# Patient Record
Sex: Female | Born: 1955 | Race: White | Hispanic: No | Smoking: Former smoker
Health system: Southern US, Community
[De-identification: ages and names within clinical notes are randomized; demographics above are authoritative.]

## PROBLEM LIST (undated history)

## (undated) DIAGNOSIS — M26609 Unspecified temporomandibular joint disorder, unspecified side: Secondary | ICD-10-CM

## (undated) DIAGNOSIS — Z86010 Personal history of colonic polyps: Secondary | ICD-10-CM

## (undated) DIAGNOSIS — M542 Cervicalgia: Secondary | ICD-10-CM

## (undated) DIAGNOSIS — R519 Headache, unspecified: Secondary | ICD-10-CM

## (undated) DIAGNOSIS — Z8719 Personal history of other diseases of the digestive system: Secondary | ICD-10-CM

## (undated) DIAGNOSIS — E119 Type 2 diabetes mellitus without complications: Secondary | ICD-10-CM

## (undated) DIAGNOSIS — C080 Malignant neoplasm of submandibular gland: Secondary | ICD-10-CM

## (undated) DIAGNOSIS — M1990 Unspecified osteoarthritis, unspecified site: Secondary | ICD-10-CM

## (undated) DIAGNOSIS — N3946 Mixed incontinence: Secondary | ICD-10-CM

## (undated) DIAGNOSIS — Z860101 Personal history of adenomatous and serrated colon polyps: Secondary | ICD-10-CM

## (undated) DIAGNOSIS — R682 Dry mouth, unspecified: Secondary | ICD-10-CM

## (undated) DIAGNOSIS — K649 Unspecified hemorrhoids: Secondary | ICD-10-CM

## (undated) DIAGNOSIS — Z923 Personal history of irradiation: Secondary | ICD-10-CM

## (undated) DIAGNOSIS — E781 Pure hyperglyceridemia: Secondary | ICD-10-CM

## (undated) DIAGNOSIS — Z853 Personal history of malignant neoplasm of breast: Secondary | ICD-10-CM

## (undated) DIAGNOSIS — F319 Bipolar disorder, unspecified: Secondary | ICD-10-CM

## (undated) DIAGNOSIS — Z8669 Personal history of other diseases of the nervous system and sense organs: Secondary | ICD-10-CM

## (undated) DIAGNOSIS — C50919 Malignant neoplasm of unspecified site of unspecified female breast: Secondary | ICD-10-CM

## (undated) DIAGNOSIS — R432 Parageusia: Secondary | ICD-10-CM

## (undated) DIAGNOSIS — F411 Generalized anxiety disorder: Secondary | ICD-10-CM

## (undated) DIAGNOSIS — K219 Gastro-esophageal reflux disease without esophagitis: Secondary | ICD-10-CM

## (undated) DIAGNOSIS — Z973 Presence of spectacles and contact lenses: Secondary | ICD-10-CM

## (undated) DIAGNOSIS — Z8659 Personal history of other mental and behavioral disorders: Secondary | ICD-10-CM

## (undated) DIAGNOSIS — J449 Chronic obstructive pulmonary disease, unspecified: Secondary | ICD-10-CM

## (undated) DIAGNOSIS — IMO0002 Reserved for concepts with insufficient information to code with codable children: Secondary | ICD-10-CM

## (undated) DIAGNOSIS — Z9889 Other specified postprocedural states: Secondary | ICD-10-CM

## (undated) DIAGNOSIS — D649 Anemia, unspecified: Secondary | ICD-10-CM

## (undated) DIAGNOSIS — K08199 Complete loss of teeth due to other specified cause, unspecified class: Secondary | ICD-10-CM

## (undated) DIAGNOSIS — R51 Headache: Secondary | ICD-10-CM

## (undated) DIAGNOSIS — G629 Polyneuropathy, unspecified: Secondary | ICD-10-CM

## (undated) DIAGNOSIS — Z9221 Personal history of antineoplastic chemotherapy: Secondary | ICD-10-CM

## (undated) HISTORY — DX: Pure hyperglyceridemia: E78.1

## (undated) HISTORY — DX: Bipolar disorder, unspecified: F31.9

## (undated) HISTORY — PX: EXCISION / BIOPSY BREAST / NIPPLE / DUCT: SUR469

## (undated) HISTORY — PX: TUBAL LIGATION: SHX77

## (undated) HISTORY — DX: Gastro-esophageal reflux disease without esophagitis: K21.9

## (undated) HISTORY — PX: ECTOPIC PREGNANCY SURGERY: SHX613

## (undated) HISTORY — DX: Malignant neoplasm of unspecified site of unspecified female breast: C50.919

## (undated) HISTORY — PX: OTHER SURGICAL HISTORY: SHX169

## (undated) HISTORY — DX: Chronic obstructive pulmonary disease, unspecified: J44.9

## (undated) HISTORY — PX: VAGINAL HYSTERECTOMY: SUR661

---

## 2002-10-11 ENCOUNTER — Ambulatory Visit (HOSPITAL_COMMUNITY): Admission: RE | Admit: 2002-10-11 | Discharge: 2002-10-11 | Payer: Self-pay | Admitting: Pulmonary Disease

## 2006-11-17 ENCOUNTER — Ambulatory Visit (HOSPITAL_COMMUNITY): Admission: RE | Admit: 2006-11-17 | Discharge: 2006-11-17 | Payer: Self-pay | Admitting: Family Medicine

## 2006-11-24 ENCOUNTER — Ambulatory Visit (HOSPITAL_COMMUNITY): Admission: RE | Admit: 2006-11-24 | Discharge: 2006-11-24 | Payer: Self-pay | Admitting: Family Medicine

## 2006-12-01 ENCOUNTER — Encounter (INDEPENDENT_AMBULATORY_CARE_PROVIDER_SITE_OTHER): Payer: Self-pay | Admitting: *Deleted

## 2006-12-01 ENCOUNTER — Ambulatory Visit (HOSPITAL_COMMUNITY): Admission: RE | Admit: 2006-12-01 | Discharge: 2006-12-01 | Payer: Self-pay | Admitting: General Surgery

## 2006-12-01 HISTORY — PX: PARTIAL MASTECTOMY WITH AXILLARY SENTINEL LYMPH NODE BIOPSY: SHX6004

## 2006-12-21 DIAGNOSIS — C50919 Malignant neoplasm of unspecified site of unspecified female breast: Secondary | ICD-10-CM

## 2006-12-21 HISTORY — DX: Malignant neoplasm of unspecified site of unspecified female breast: C50.919

## 2006-12-22 ENCOUNTER — Ambulatory Visit (HOSPITAL_COMMUNITY): Payer: Self-pay | Admitting: Oncology

## 2006-12-22 ENCOUNTER — Encounter (HOSPITAL_COMMUNITY): Admission: RE | Admit: 2006-12-22 | Discharge: 2007-01-21 | Payer: Self-pay | Admitting: Oncology

## 2006-12-23 ENCOUNTER — Ambulatory Visit: Payer: Self-pay | Admitting: Cardiovascular Disease

## 2006-12-29 HISTORY — PX: TRANSTHORACIC ECHOCARDIOGRAM: SHX275

## 2006-12-31 ENCOUNTER — Ambulatory Visit (HOSPITAL_COMMUNITY): Admission: RE | Admit: 2006-12-31 | Discharge: 2006-12-31 | Payer: Self-pay | Admitting: General Surgery

## 2007-01-03 ENCOUNTER — Encounter: Admission: RE | Admit: 2007-01-03 | Discharge: 2007-01-03 | Payer: Self-pay | Admitting: Dentistry

## 2007-01-03 ENCOUNTER — Ambulatory Visit: Payer: Self-pay | Admitting: Dentistry

## 2007-01-28 ENCOUNTER — Encounter (HOSPITAL_COMMUNITY): Admission: RE | Admit: 2007-01-28 | Discharge: 2007-02-27 | Payer: Self-pay | Admitting: Oncology

## 2007-02-08 ENCOUNTER — Ambulatory Visit (HOSPITAL_COMMUNITY): Payer: Self-pay | Admitting: Oncology

## 2007-02-11 ENCOUNTER — Inpatient Hospital Stay (HOSPITAL_COMMUNITY): Admission: EM | Admit: 2007-02-11 | Discharge: 2007-02-16 | Payer: Self-pay | Admitting: Emergency Medicine

## 2007-02-11 ENCOUNTER — Ambulatory Visit: Payer: Self-pay | Admitting: Internal Medicine

## 2007-02-14 ENCOUNTER — Ambulatory Visit: Payer: Self-pay | Admitting: Internal Medicine

## 2007-02-14 ENCOUNTER — Encounter (INDEPENDENT_AMBULATORY_CARE_PROVIDER_SITE_OTHER): Payer: Self-pay | Admitting: *Deleted

## 2007-03-04 ENCOUNTER — Ambulatory Visit: Payer: Self-pay | Admitting: Oncology

## 2007-03-04 ENCOUNTER — Inpatient Hospital Stay (HOSPITAL_COMMUNITY): Admission: AD | Admit: 2007-03-04 | Discharge: 2007-03-11 | Payer: Self-pay | Admitting: Family Medicine

## 2007-03-18 ENCOUNTER — Ambulatory Visit (HOSPITAL_COMMUNITY): Admission: RE | Admit: 2007-03-18 | Discharge: 2007-03-18 | Payer: Self-pay | Admitting: Family Medicine

## 2007-03-21 ENCOUNTER — Ambulatory Visit: Payer: Self-pay | Admitting: Dentistry

## 2007-03-22 ENCOUNTER — Encounter (HOSPITAL_COMMUNITY): Admission: RE | Admit: 2007-03-22 | Discharge: 2007-04-21 | Payer: Self-pay | Admitting: Oncology

## 2007-03-29 ENCOUNTER — Ambulatory Visit: Payer: Self-pay | Admitting: Cardiovascular Disease

## 2007-04-05 ENCOUNTER — Ambulatory Visit (HOSPITAL_COMMUNITY): Payer: Self-pay | Admitting: Oncology

## 2007-04-19 ENCOUNTER — Ambulatory Visit: Admission: RE | Admit: 2007-04-19 | Discharge: 2007-06-29 | Payer: Self-pay | Admitting: Radiation Oncology

## 2007-05-04 ENCOUNTER — Encounter (HOSPITAL_COMMUNITY): Admission: RE | Admit: 2007-05-04 | Discharge: 2007-06-03 | Payer: Self-pay | Admitting: Oncology

## 2007-05-13 ENCOUNTER — Ambulatory Visit (HOSPITAL_COMMUNITY): Admission: RE | Admit: 2007-05-13 | Discharge: 2007-05-13 | Payer: Self-pay | Admitting: General Surgery

## 2007-06-21 ENCOUNTER — Encounter: Payer: Self-pay | Admitting: Internal Medicine

## 2007-10-26 ENCOUNTER — Inpatient Hospital Stay (HOSPITAL_COMMUNITY): Admission: RE | Admit: 2007-10-26 | Discharge: 2007-10-29 | Payer: Self-pay | Admitting: Obstetrics & Gynecology

## 2007-10-26 ENCOUNTER — Encounter: Payer: Self-pay | Admitting: Obstetrics & Gynecology

## 2007-11-02 ENCOUNTER — Encounter (HOSPITAL_COMMUNITY): Admission: RE | Admit: 2007-11-02 | Discharge: 2007-12-02 | Payer: Self-pay | Admitting: Oncology

## 2007-11-02 ENCOUNTER — Ambulatory Visit (HOSPITAL_COMMUNITY): Payer: Self-pay | Admitting: Oncology

## 2008-11-29 ENCOUNTER — Ambulatory Visit (HOSPITAL_COMMUNITY): Admission: RE | Admit: 2008-11-29 | Discharge: 2008-11-29 | Payer: Self-pay | Admitting: Family Medicine

## 2009-12-21 DIAGNOSIS — J449 Chronic obstructive pulmonary disease, unspecified: Secondary | ICD-10-CM

## 2009-12-21 HISTORY — DX: Chronic obstructive pulmonary disease, unspecified: J44.9

## 2010-03-25 ENCOUNTER — Encounter (INDEPENDENT_AMBULATORY_CARE_PROVIDER_SITE_OTHER): Payer: Self-pay

## 2010-03-26 ENCOUNTER — Ambulatory Visit (HOSPITAL_COMMUNITY): Admission: RE | Admit: 2010-03-26 | Discharge: 2010-03-26 | Payer: Self-pay | Admitting: Family Medicine

## 2010-08-05 ENCOUNTER — Emergency Department (HOSPITAL_COMMUNITY): Admission: EM | Admit: 2010-08-05 | Discharge: 2010-08-05 | Payer: Self-pay | Admitting: Emergency Medicine

## 2011-01-22 NOTE — Letter (Signed)
Summary: Recall Colonoscopy/Endoscopy, Change to Office Visit  [facility]  [address]   [place], [place] [address]   Phone: [phone]  Fax: [phone]      March 25, 2010   GIOVANNA KEMMERER [address] [place], [place]  [address] [birth year]   Dear Ms. Corine Shelter,   According to our records, it is time for you to schedule a Colonoscopy. Please call and ask to speak to the triage nurse to get this scheduled.   Sincerely,   Cloria Spring LPN  [facility] Ph: [phone]   Fax: [phone]

## 2011-03-06 LAB — BASIC METABOLIC PANEL
CO2: 23 mEq/L (ref 19–32)
Calcium: 9 mg/dL (ref 8.4–10.5)
GFR calc Af Amer: >60 mL/min (ref >60)
Sodium: 136 mEq/L (ref 135–145)

## 2011-05-05 NOTE — H&P (Signed)
[name], [name]              ACCOUNT NO.:  [id number]   MEDICAL RECORD NO.:  [id number]          PATIENT TYPE:  AMB   LOCATION:                                FACILITY:  [facility]   PHYSICIAN:  Dalia Heading, M.D.  DATE OF BIRTH:  1956/09/13   DATE OF ADMISSION:  05/13/2007  DATE OF DISCHARGE:  LH                              HISTORY & PHYSICAL   CHIEF COMPLAINT:  Right breast carcinoma, finished with chemotherapy.   HISTORY OF PRESENT ILLNESS:  The patient is a 55 year old white female  who underwent chemotherapy for breast cancer.  She now presents for port-  A-Cath removal.   PAST MEDICAL HISTORY:  1. As noted above.  2. Depression/anxiety.   PAST SURGICAL HISTORY:  1. Right partial mastectomy, sentinel lymph node biopsy in December      2007.  2. Tubal ligation in the past.   CURRENT MEDICATIONS:  Prozac, baby aspirin, Clarinex, Xanax, trazodone.   ALLERGIES:  PENICILLIN.   REVIEW OF SYSTEMS:  Noncontributory.   PHYSICAL EXAMINATION:  GENERAL:  The patient is a well-developed, well-  nourished white female, in no acute distress.  LUNGS:  Clear to auscultation, with equal breath sounds bilaterally.  HEART:  Regular rate and rhythm, without S3, S4, or murmurs.  The port-A-  Cath is in place in the left upper chest.   IMPRESSION:  Right breast cancer, finished with chemotherapy.   PLAN:  The patient is scheduled for port-A-Cath removal under local  anesthesia on May 13, 2007.  The risks and benefits of the procedure,  including bleeding and infection, were fully explained to the patient,  who gave informed consent.     Dalia Heading, M.D.  Electronically Signed    MAJ/MEDQ  D:  05/10/2007  T:  05/10/2007  Job:  [id number]

## 2011-05-05 NOTE — H&P (Signed)
NAME:  [name], [name] NO.:  [id number]   MEDICAL RECORD NO.:  [id number]          PATIENT TYPE:  INP   LOCATION:  A310                          FACILITY:  [facility]   PHYSICIAN:  Lazaro Arms, M.D.   DATE OF BIRTH:  12/14/1956   DATE OF ADMISSION:  DATE OF DISCHARGE:  LH                              HISTORY & PHYSICAL   [name] is a 55 year old female, gravida 4, para 3, abortus 1, who  presented to my office on October 06, 2007, after having been seen in  the [facility].  On examination she has  complete uterine prolapse with also a grade 3 cystocele.  She denies any  incontinence.  Her symptoms have gotten progressively worse and her  pressure and pain is constant.  As a result, she is admitted for a  TVH/BSO if possible, anterior colporrhaphy with a graft, and a vaginal  vault suspension.  She really does not have any significant rectal  prolapse at this point although it could occur in the future, but I do  not want to do that as the same time as the anterior graft.  Additionally, she understands she may develop incontinence after the  anterior colporrhaphy, in which case it might be needed to do a sling in  the future but I would not do that prophylactically at this point.   PAST MEDICAL HISTORY:  1. Depression and anxiety.  2. History of breast cancer.   PAST SURGERY:  She had a lumpectomy in 2007, port placed for  chemotherapy in January 2008, and the port removed in June 2008.   MEDICATIONS:  Prozac and alprazolam.   She states she is allergic to PENICILLIN but does not know her reaction.   REVIEW OF SYSTEMS:  As per HPI, otherwise negative.   Blood pressure is 100/60, weight is 189 pounds.  HEENT:  Unremarkable.  Thyroid is normal.  LUNGS:  Clear.  HEART:  Regular rate and rhythm, no murmurs, rubs or gallops.  BREASTS:  Without mass, discharge or skin changes.  ABDOMEN:  Benign without hepatosplenomegaly or masses, obese.  She has normal external genitalia.  In the supine position at rest she  has grade 3 uterine prolapse and grade 3 cystocele.  On Valsalva her  uterus actually comes out.  She has insignificant rectal prolapse.  EXTREMITIES:  Warm with no edema.  NEUROLOGIC:  Grossly intact.   IMPRESSION:  1. Complete uterovaginal prolapse.  2. Grade 3 cystocele.   PLAN:  The patient is admitted for a vaginal hysterectomy, bilateral  salpingo-oophorectomy, anterior colporrhaphy with placement of an  anterior graft, and a vaginal vault suspension.  The patient understands  the risks, benefits, indications and alternatives and will proceed.      Lazaro Arms, M.D.  Electronically Signed     LHE/MEDQ  D:  10/25/2007  T:  10/26/2007  Job:  [id number]

## 2011-05-05 NOTE — Op Note (Signed)
[name], [name]              ACCOUNT NO.:  [id number]   MEDICAL RECORD NO.:  [id number]          PATIENT TYPE:  INP   LOCATION:  A310                          FACILITY:  [facility]   PHYSICIAN:  Lazaro Arms, M.D.   DATE OF BIRTH:  07-16-1956   DATE OF PROCEDURE:  10/26/2007  DATE OF DISCHARGE:                               OPERATIVE REPORT   PREOPERATIVE DIAGNOSIS:  1. Complete pelvic prolapse.  2. Grade 3 cystocele.  3. Uterine prolapse.   POSTOPERATIVE DIAGNOSIS:  1. Complete pelvic prolapse.  2. Grade 3 cystocele.  3. Uterine prolapse.   PROCEDURE:  Vaginal hysterectomy, anterior corporrhaphy with placement  of an intraspinous graft, and a vaginal vault suspension.   SURGEON:  Lazaro Arms, M.D.   ANESTHESIA:  General endotracheal anesthesia.   FINDINGS:  The patient had, at rest in the supine position, a grade 3  cystocele and a grade 3 uterine prolapse.  She had grade 1 rectocele.  The uterus was, otherwise, normal.  There were no other abnormalities  seen.  The ovaries were very small and atrophic and basically adherent  to the pelvic sidewall, could not be safely removed vaginally.   DESCRIPTION OF PROCEDURE:  The patient was taken to the operating room  and placed in supine position where she underwent general endotracheal  anesthesia, placed in the dorsal lithotomy position, prepped and draped  in the usual sterile fashion.  A Foley catheter was placed.  The cervix  was grasped and 0.5% Marcaine with 1:200,000 epinephrine was injected  about the cervix to develop both plain and as a hemostatic agent. A  circumferential incision was made and the anterior and posterior vagina  pushed off the lower uterine segment.  The posterior cul-de-sac was  entered sharply.  The uterosacral ligatures were clamped, cut and suture  ligated.  The cardinal ligaments bilaterally were then clamped, cut and  suture ligated.  The uterosacral ligaments were held.  The anterior cul-  de-sac was entered without difficulty.  The anterior and posterior  leaves of the broad ligament were plicated.  The uterine vessels were  then clamped, cut and suture ligated.  Serial pedicles were taken up the  fundus, each pedicle being clamped, cut and ligated.  The utero-ovarian  ligaments were cross clamped and double suture ligated bilaterally with  good hemostasis. Again, the ovaries were very atrophic and they were  adherent to the pelvic sidewall up quite high and they could not be  safely removed vaginally.  They appeared to be completely normal.  The  peritoneum was then closed in a pursestring fashion.  The vagina was  closed anterior to posterior in an interrupted fashion without  difficulty.  The anterior vagina was then cut in the midline after  hemostatic agent had been injected and the bladder was dissected off the  vagina without difficulty.  An 8 by 12 Pelvicol graft was used.  It was  cut to size and the sacral spinous ligaments were dissected out  bilaterally.  The Capeo needles were used, the needle driver, and the  graft was anchored to  the sacrospinous ligament bilaterally with good  anatomical effect. The vagina was then sutured to the graft as a  intraspinous vaginal vault suspension and with excellent vaginal angle  support. The graft was then anchored to the pubocervical ligament  bilaterally, again, with good support and the bladder neck was not in  any way impeded with its movement. The vagina was then trimmed slightly  and the vagina was closed in an interrupted fashion with good  hemostasis.  Vaginal packing was placed with TechniCare.  The patient  was awakened from anesthesia, she tolerated the procedure well, she was  taken to the recovery room in good, stable condition.  All counts were  correct.  She experienced 200 mL of blood loss.  She received Ancef  prophylactically.      Lazaro Arms, M.D.  Electronically Signed     LHE/MEDQ  D:   10/26/2007  T:  10/26/2007  Job:  [id number]

## 2011-05-05 NOTE — Op Note (Signed)
[name], [name]              ACCOUNT NO.:  [id number]   MEDICAL RECORD NO.:  [id number]          PATIENT TYPE:  AMB   LOCATION:  DAY                           FACILITY:  [facility]   PHYSICIAN:  Dalia Heading, M.D.  DATE OF BIRTH:  [birth year]   DATE OF PROCEDURE:  05/13/2007  DATE OF DISCHARGE:                               OPERATIVE REPORT   PREOPERATIVE DIAGNOSIS:  Right breast cancer, finished with  chemotherapy.   POSTOPERATIVE DIAGNOSIS:  Right breast cancer, finished with  chemotherapy.   PROCEDURE:  Port-A-Cath removal.   SURGEON:  Dr. Franky Macho.   ANESTHESIA:  Local.   INDICATIONS:  The patient is a 55 year old white female who has finished  with her chemotherapy and presents for Port-A-Cath removal.  The risks  and benefits of the procedure were fully explained to the patient, gave  informed consent.   PROCEDURE NOTE:  The patient was placed in supine position.  The left  upper chest was prepped and draped using the usual sterile technique  with Betadine.  Surgical site confirmation was performed.  Then 7 mL of  1% Xylocaine was used for local anesthesia.  Incision was made through  the previous surgical incision.  The Port-A-Cath was removed without  difficulty.  The subcutaneous layer was reapproximated using a 3-0  Vicryl interrupted suture.  The skin was closed using a 4-0 Vicryl  subcuticular suture.  Dermabond was then applied.   All tape and needle counts were correct at the end of the procedure.  The patient was transferred back to day surgery in stable condition.   COMPLICATIONS:  None.   SPECIMEN:  Port.   BLOOD LOSS:  Minimal.      Dalia Heading, M.D.  Electronically Signed    MAJ/MEDQ  D:  05/13/2007  T:  05/13/2007  Job:  [id number]

## 2011-05-05 NOTE — Discharge Summary (Signed)
[name], [name]              ACCOUNT NO.:  [id number]   MEDICAL RECORD NO.:  [id number]          PATIENT TYPE:  INP   LOCATION:  A310                          FACILITY:  [facility]   PHYSICIAN:  Lazaro Arms, M.D.   DATE OF BIRTH:  [birth year]   DATE OF ADMISSION:  10/26/2007  DATE OF DISCHARGE:  11/08/2008LH                               DISCHARGE SUMMARY   DISCHARGE DIAGNOSES:  1. Status post vaginal hysterectomy with anterior colporrhaphy,      placement of Pelvicol graft and a vaginal bulb suspension.  2. Unremarkable postoperative course.   PROCEDURE:  1. Vaginal hysterectomy.  2. Anterior colporrhaphy with placement of a Pelvicol graft anchored      to the sacrospinous ligament bilaterally.  3. Vaginal bulb suspension.   Please refer to the History & Physical and the operative report for  details of admission to the hospital.   HOSPITAL COURSE:  The patient was admitted after surgery.  Intraoperative course was unremarkable.  She did quite well  postoperatively, a little sluggish getting around and trying to get her  pain medicine squared away, but overall she has done well.  Her  hemoglobin and hematocrit have been basically stable postop, she was 11  and 33 on postop day #1, 10.4 and 32 on postop day #2, and 10.7 and 33  on postop day #3.  She has been afebrile, she tolerated clear liquids  and a regular diet.  She has her Foley catheter in to allow healing of  the bladder status post anterior repair with graft and her urine output  is clear and good volume.  She is ambulatory, tolerating clear liquids  and a regular diet.  She is tolerating the oral pain medicine well with  Dilaudid and Motrin.  She is discharged home on postop day #3 in good  stable condition, to follow up in the office on Tuesday, the 11th.  She  is discharged home with her catheter in.  As stated previously, she is  given Dilaudid 2 mg, #40, for pain, Motrin 800 mg, #20, as needed for  pain, and  Levaquin 500 mg one a day because of the indwelling Foley and  the graft and because she is diabetic.  She is given instructions and  precautions to return to the office prior to her scheduled visit on the  11th.      Lazaro Arms, M.D.  Electronically Signed     LHE/MEDQ  D:  10/29/2007  T:  10/30/2007  Job:  [id number]

## 2011-05-08 NOTE — H&P (Signed)
[name], [name]              ACCOUNT NO.:  [id number]   MEDICAL RECORD NO.:  [id number]          PATIENT TYPE:  INP   LOCATION:  A202                          FACILITY:  [facility]   PHYSICIAN:  Marcello Moores, MD   DATE OF BIRTH:  [birth year]   DATE OF ADMISSION:  02/11/2007  DATE OF DISCHARGE:  LH                              HISTORY & PHYSICAL   PRIMARY CARE PHYSICIAN:  She is going to start with Dr. Jen Mow.   ONCOLOGIST:  Dr. Ladona Horns. Neijstrom.   CHIEF COMPLAINT:  Bloody dark stools for the last 3 days.   HISTORY OF PRESENT ILLNESS:  Ms. Lengyel is a 55 year old female patient  with history of right breast CA status post mastectomy and on  chemotherapy. She started to have dark stool and sometimes bloody, for  the last 3 days. As per the patient, she had this episode for the last 3  days at least 2-3 times a day and she decided to come to the emergency  room. She had a mild episode of abdominal pain but she denied any  vomiting. The patient was not on any Coumadin or aspirin. She is  currently on chemotherapy with Dr. Mariel Sleet. The patient denied any  other associated symptoms, no dyspnea, no chest pain, no dizziness. She  has no urinary complaints.   REVIEW OF SYSTEMS:  A 10 point review of systems is detailed in the HPI.  There is no other contributory on the review.   ALLERGIES:  PENICILLIN.   PAST MEDICAL HISTORY:  1. Right breast carcinoma. She is status post surgery and she is on      chemotherapy currently.  2. Diabetes mellitus type 2.  3. Psychiatric disorder.   HOME MEDICATIONS:  1. She is on chemotherapy.  2. Metformin 500 mg p.o. b.i.d.  3. Prozac 60 mg p.o. daily.  4. Xanax p.r.n.  5. Trazodone 100 mg p.o. daily.  6. Clarinex p.r.n.   SOCIAL HISTORY:  She is married and living with her husband and her  children. She is currently not working. She denied any history of  smoking, history of alcohol abuse or drug abuse.   FAMILY HISTORY:   Noncontributory.   PHYSICAL EXAMINATION:  GENERAL: The patient is lying in bed without any  respiratory distress.  VITAL SIGNS: Blood pressure is 93/62, pulse rate is 105, temperature is  98.4, saturation 97%, respiratory rate is 20 per minute.  HEENT: She has pink conjunctivae, anicteric sclerae.  NECK: Supple.  CHEST: Good air entry, clear bilaterally.  CARDIOVASCULAR: S1, S2 regular, normal, tachycardic.  ABDOMEN: Soft, mild diffuse tenderness, otherwise no guarding or  rigidity, normal active bowel sounds.  MUSCULOSKELETAL: No abnormality.  GENITOURINARY: No abnormality.  EXTREMITIES: No pedal edema. Peripheral pulses positive.  CNS: Alert and oriented.   LABORATORY DATA:  White blood cell count is 1.5 thousand, hemoglobin is  10.3 and hematocrit 29, platelet count is 151,000. Chemistries reveal a  sodium of 136, potassium 3.6, chloride 102, bicarb 26 and glucose is  111, BUN is 9, creatinine 0.8, calcium is 9.1. Stool Guaiac is strongly  positive.  ASSESSMENT:  1. Gastrointestinal bleed with low blood pressure. Plan to admit her      and will do blood type and crossmatch and will prepare 2 units of      packed red blood cells. Will give her normal saline bolus as well      at a high rate, 125 mL/Hr.  Will monitor vital signs and will watch      for bleeding. Will monitor hemoglobin and hematocrit and will      consider transfusion if dropped, and GI consult will be done today.      Will send PT and INR as well and will put her on Protonix      intravenously.  2. Neutropenia probably secondary to chemotherapy. Will monitor white      blood cells and if dropped we will start G-CSF and will consult      hematology as well.  3. Diabetes mellitus. Will put her on her home medications and will      monitor capillary blood glucose and will manage accordingly.  4. Breast carcinoma status post surgery and on chemotherapy. The      patient will be admitted on telemetry and will  monitor her for      bleeding and vital signs and we will continue her management      depending on the hemoglobin level as well as on the white blood      cell count level and will send blood culture also because she has      profound neutropenia.      Marcello Moores, MD  Electronically Signed     MT/MEDQ  D:  02/11/2007  T:  02/11/2007  Job:  [id number]   cc:   Ladona Horns. Mariel Sleet, MD  Fax: [phone]

## 2011-05-08 NOTE — Consult Note (Signed)
[name], DIVER              ACCOUNT NO.:  [id number]   MEDICAL RECORD NO.:  [id number]          PATIENT TYPE:  INP   LOCATION:  A202                          FACILITY:  [facility]   PHYSICIAN:  R. Roetta Sessions, M.D. DATE OF BIRTH:  1956-03-02   DATE OF CONSULTATION:  02/11/2007  DATE OF DISCHARGE:                                 CONSULTATION   REASON FOR CONSULTATION:  GI bleed.   REQUESTING PHYSICIAN:  In [facility].   HISTORY OF PRESENT ILLNESS:  The patient is 55 year old Caucasian female  with a history of right breast cancer who is undergoing chemotherapy.  She underwent her third round of chemotherapy on February 01, 2007.  She  presented to the emergency department with complaints of bloody stools.  She complains of diarrhea post chemotherapy.  She had diarrhea for about  a week.  Earlier in the week, she started passing dark stools and  yesterday started having fresh blood per rectum with small blood clots.  She complains of lower abdominal cramping.  She has had no fever or  chills.  She has had some nausea but no vomiting.  She has had heartburn  for a couple of days.  In the emergency department, her stool was dark  and heme positive.  On February 01, 2007, prior to chemotherapy, her ANC  was 1600.  Today her ANC was 400.  She received Neulasta on February 02, 2007.  In the emergency department, her hemoglobin was 10.3, down from  11.9 on February 08, 2007, and later today her hemoglobin was down to  8.9.  Her white count is 1200 with absolute neutrophil count of 400.  Her INR is 1.1.  Her platelet count was low normal at 151,090 earlier in  the day, but this afternoon was 129,000.   HOME MEDICATIONS:  1. Pepcid 20 mg b.i.d. p.r.n.  2. Kaopectate p.r.n.  3. She takes an antiemetic but cannot recall the name.  4. She is on Metformin.  5. Prozac.  6. Clarinex.  7. Xanax.  8. Trazodone.   ALLERGIES:  1. SHE HAS HAD ANAPHYLACTIC REACTION TO PENICILLIN.  2.  VICODIN CAUSES ITCHING.  3. SHE IS ALLERGIC TO BEE STINGS.   PAST MEDICAL HISTORY:  1. She was diagnosed with right breast cancer in December 2007.      a.     She underwent partial mastectomy.      b.     She has received 3 cycles of chemotherapy, the last on       February 01, 2007.      c.     She has a Port-A-Cath.  2. She has a history of depression and anxiety.  3. Diabetes mellitus.  4. Previous bilateral tubal ligation.  5. Surgery for a tubal pregnancy.  6. She has never had a colonoscopy or EGD.   FAMILY HISTORY:  Paternal grandmother had colon cancer and died at age  12.   SOCIAL HISTORY:  She is married, has 3 children.  She is a stay at home  Mom.  She quit smoking 11 years  ago.  No alcohol use.   REVIEW OF SYSTEMS:  See HPI for GI.  CONSTITUTIONAL:  Denies any weight  loss.  CARDIOPULMONARY:  She complains of a dry cough.   PHYSICAL EXAMINATION:  VITAL SIGNS:  Temp 98.4, pulse 92, respirations  18, blood pressure 99/58.  GENERAL:  A pleasant, obese, Caucasian female in no acute distress.  SKIN:  Warm and dry.  No jaundice.  HEENT:  Sclerae nonicteric.  Oropharyngeal mucosa is moist and pink.  No  lymphadenopathy, thyromegaly.  CHEST:  Lungs clear to auscultation.  CARDIAC:  Reveals a regular rate and rhythm.  No murmurs, rubs, or  gallops.  ABDOMEN:  Positive bowel sounds.  Obese but symmetrical and minimal  lower abdominal tenderness to deep palpation.  No rebound tenderness or  guarding.  No organomegaly or masses.  EXTREMITIES:  No edema.  RECTAL:  In the emergency department, she had dark stool which was heme  positive.   LABORATORY:  As mentioned in HPI.  In addition, sodium 136, potassium  3.6, BUN 9, creatinine 0.81, glucose 211.   IMPRESSION:  The patient is a 55 year old lady who presents with  diarrhea and gastrointestinal bleed in the setting of chemotherapy and  an extreme neutropenia.  Her absolute neutrophil count is 400.  She  complains of  melena which began earlier in the week as well as fresh  blood with clots.  Cannot exclude possible upper gastrointestinal bleed  versus small bowel lesion or even right colon.  Given her significant  neutropenia, she is at risk for typhlitis.   RECOMMENDATIONS:  1. Recommend EGD and colonoscopy once her ANC is greater than 1,000.  2. Continue PPI therapy.  3. Consider minimizing blood draw, i.e., decrease CBC count every 8-12      hours for the next 24 and then daily,      assuming she remains stable.  4. We will check stool for C. diff, O&P culture, WBC.  5. Further recommendations to follow.      Tana Coast, P.AJonathon Bellows, M.D.  Electronically Signed    LL/MEDQ  D:  02/11/2007  T:  02/11/2007  Job:  [id number]   cc:   In [employer]. Mariel Sleet, MD  Fax: [phone]

## 2011-05-08 NOTE — Procedures (Signed)
[name], [name]              ACCOUNT NO.:  [id number]   MEDICAL RECORD NO.:  [id number]            PATIENT TYPE:  REC   LOCATION:  RAD                           FACILITY:  [facility]   PHYSICIAN:  Peter C. Eden Emms, MD, FACCDATE OF BIRTH:  1956-04-10   DATE OF PROCEDURE:  12/29/2006  DATE OF DISCHARGE:                                ECHOCARDIOGRAM   PROCEDURE:  2D echocardiogram.   INDICATIONS:  Pre-chemo, check LV function.   Left ventricular cavity size was normal.  EF was 55%.  There was no  regional wall motion abnormalities.  Mitral, aortic, and tricuspid  valves were normal.  There was no evidence of pulmonary hypertension.  Left atrium and right sided cardiac chambers were normal.   Subcostal imaging revealed no atrial septal defect, no source of  embolus, no effusion.   M-mode diameters included the following:  Aortic diameter 2.9 cm, left  atrial diameter 3.8 cm, septal thickness 12 mm, posterior wall thickness  10 mm, LV end diastolic dimension 48 mm, LV systolic dimension 34 mm.   FINAL IMPRESSION:  Normal 2-D echocardiogram, ejection fraction 55%.      Noralyn Pick. Eden Emms, MD, [facility]  Electronically Signed     PCN/MEDQ  D:  12/29/2006  T:  12/29/2006  Job:  [id number]

## 2011-05-08 NOTE — Group Therapy Note (Signed)
[name], [name]              ACCOUNT NO.:  [id number]   MEDICAL RECORD NO.:  [id number]          PATIENT TYPE:  INP   LOCATION:  A202                          FACILITY:  [facility]   PHYSICIAN:  Margaretmary Dys, M.D.DATE OF BIRTH:  February 15, 1956   DATE OF PROCEDURE:  02/13/2007  DATE OF DISCHARGE:                                 PROGRESS NOTE   SUBJECTIVE:  The patient feels much better today.  She has no  significant pain.  She did have some chest pressure over night but that  has resolved and leukopenia has also resolved.  She denies any fevers or  chills.  Today is the patient's birthday.  She has had no nausea or  vomiting and appetite has improved.   OBJECTIVE:  Conscious, alert, comfortable, in no acute distress.  VITAL SIGNS:  Stable.  HEENT:  Normocephalic, atraumatic.  The patient is bald from her  chemotherapy.  NECK:  Supple.  No JVD or lymphadenopathy.  LUNGS:  Clear and equal with good air entry bilaterally.  HEART:  S1, S2, regular.  No S3, clicks, gallops, or rubs.  ABDOMEN:  Soft, nontender.  Bowel sounds positive.  EXTREMITIES:  No edema.   LABORATORY DATA:  White blood cell count is improved to 7.1 today with  normal neutrophil counts.  H and H also remains stable at 9.8 and 27.   ASSESSMENT AND PLAN:  1. Gastrointestinal bleed.  The patient is going to have a colonoscopy      and EGD tomorrow morning.  She is being prepped tonight.  She is      otherwise hemodynamically stable.  2. Chest pressure.  I think it is mostly due to anxiety.  We will get      a 12-lead EKG and we will follow.  3. Breast cancer.  The patient is currently getting chemotherapy.  Has      nausea and vomiting from her chemotherapy.  This is improved.  The      patient is being followed by Dr. Mariel Sleet, who will decide when      her next chemotherapy will be.   Overall, the patient remains stable at this time.  There is no  indication for a blood transfusion at this time.      Margaretmary Dys, M.D.  Electronically Signed     AM/MEDQ  D:  02/13/2007  T:  02/13/2007  Job:  [id number]

## 2011-05-08 NOTE — Op Note (Signed)
[name], [name]              ACCOUNT NO.:  [id number]   MEDICAL RECORD NO.:  [id number]          PATIENT TYPE:  AMB   LOCATION:  DAY                           FACILITY:  [facility]   PHYSICIAN:  Dalia Heading, M.D.  DATE OF BIRTH:  05-17-1956   DATE OF PROCEDURE:  12/01/2006  DATE OF DISCHARGE:                               OPERATIVE REPORT   PREOPERATIVE DIAGNOSIS:  Right breast carcinoma.   POSTOPERATIVE DIAGNOSIS:  Right breast carcinoma.   PROCEDURE:  Right partial mastectomy, sentinel lymph node biopsy, lymph  node injection.   SURGEON:  Dalia Heading, M.D.   ANESTHESIA:  General endotracheal.   INDICATIONS:  The patient is a 55 year old white female with biopsy  proven right breast carcinoma at approximately the 12 to 1 o'clock  position in the right breast.  The patient now comes to the operating  for right partial mastectomy, sentinel lymph node biopsy, possible  axillary dissection.  The risks and benefits of the procedure including  bleeding, infection, nerve injury, cardiopulmonary difficulties, and the  possibly of re-excision of the cancer were fully explained to the  patient who gave informed consent.   PROCEDURE NOTE:  The patient was placed in the supine position.  She had  already been injected with radioactive nuclide two hours earlier in the  subdermal areolar region.  5 mL of blue dye was then injected over the  tumor in the subdermal region.  This was massaged into place for five  minutes.  The right breast and axilla were then prepped and draped using  the usual sterile technique with Betadine.  Surgical site confirmation  was performed.   The Neoprobe was then used to isolate the sentinel lymph node in the  right axilla.  A small incision was made in the right axilla.  The skin  counts at that time were 75.  An en vivo count of 500 was found.  Ex  vivo count was 650 and a blue node was positive.  The basin count was  11.  This was sent to  pathology for a touch prep.  No evidence of  malignancy was found.  The wound was irrigated with normal saline.  The  subcutaneous layer was reapproximated using a 4-0 Vicryl interrupted  suture.  The skin was closed using a 4-0 Vicryl subcuticular suture.  0.5% Sensorcaine was instilled in the surrounding wound.   A transverse curvilinear incision was made over the mass at the 12 to 1  o'clock position in the right breast.  The mass measured approximately 2-  3 cm in its greatest diameter.  An area of grossly normal tissue was  taken around the tumor.  Any bleeding was controlled using Bovie  electrocautery.  A single short suture was placed superiorly and a long  suture laterally for orientation purposes.  The specimen was then sent  to pathology for further examination.  The skin was closed using a 4-0  Vicryl subcuticular suture.  0.5% Sensorcaine was instilled in the  surrounding wound.  Dermabond was then applied to both wounds.   All tape and  needle counts were correct at the end of the procedure.  The patient was extubated in the operating room and went back to the  recovery room awake in stable condition.  Complications none.   SPECIMEN:  1. Sentinel lymph node, right axilla.  2. Right partial mastectomy, short suture superior, long suture      lateral.   ESTIMATED BLOOD LOSS:  Less than 50 mL.      Dalia Heading, M.D.  Electronically Signed     MAJ/MEDQ  D:  12/01/2006  T:  12/01/2006  Job:  [id number]   cc:   [facility] Department

## 2011-05-08 NOTE — Group Therapy Note (Signed)
[name], [name]              ACCOUNT NO.:  [id number]   MEDICAL RECORD NO.:  [id number]          PATIENT TYPE:  INP   LOCATION:  A338                          FACILITY:  [facility]   PHYSICIAN:  Mila Homer. Sudie Bailey, M.D.DATE OF BIRTH:  [birth year]   DATE OF PROCEDURE:  03/07/2007  DATE OF DISCHARGE:                                 PROGRESS NOTE   SUBJECTIVE:  She was feeling better yesterday and then said she felt  horrible.  Sort of achy.  No real fever and chills.  She feels that way  this morning.   OBJECTIVE:  She is sitting up in bed.  She appears to be in no acute  distress.  She is well-developed, somewhat obese.  Hairless from the  chemo.  She appears to be oriented and alert.  Sentence structure is  intact.  Temperature is 98.3, pulse 63 respiratory rate 20, blood pressure  140/75.  The heart has a regular rhythm without murmurs, rate of 70.  Lungs appear clear throughout.  She is coughing actively.  ABDOMEN:  Soft.  There is no edema of the ankles.   Sugars have been 178, 196 and 243.  Today the white cell count is 4000  with 71% neutrophils, 14 lymphs, up from 2600 yesterday.  H&H 8.7/24.9  up from 8.2/23.8 yesterday.   ASSESSMENT:  1. Neutropenia secondary to chemotherapy.  2. Malignant neoplasm of the breast.  3. Anemia secondary to chemotherapy.  4. Poorly controlled type 2 diabetes.   PLAN:  I am switching her Protonix 40 mg per IV to p.o. today.  Will  start ibuprofen 800 mg t.i.d.  She will get a unit of packed cells, and  recheck her counts in the morning.  A lot of the symptoms right now may  be from on Neulasta.  Discussed with her.  Add Tussionex teaspoon t.i.d.  for the coughing.  Also I am checking a D-dimer.      Mila Homer. Sudie Bailey, M.D.  Electronically Signed     SDK/MEDQ  D:  03/08/2007  T:  03/08/2007  Job:  [id number]

## 2011-05-08 NOTE — Group Therapy Note (Signed)
[name], [name]              ACCOUNT NO.:  [id number]   MEDICAL RECORD NO.:  [id number]          PATIENT TYPE:  INP   LOCATION:  A338                          FACILITY:  [facility]   PHYSICIAN:  Mila Homer. Sudie Bailey, M.D.DATE OF BIRTH:  Nov 30, 1956   DATE OF PROCEDURE:  DATE OF DISCHARGE:                                 PROGRESS NOTE   She does not feel well today,  feels worse than she was yesterday.  A  little tight in the chest and a cough now.  Obviously she is much more  energetic than she was on admission or even 2 days ago.  Color looks good.  She is oriented, alert.  Appears to be in no acute  distress.  A well-developed, well-nourished, temperature 98.8, pulse 79,  respiratory 22, blood pressure 99/59.  Her lungs are actually clear  throughout today. Her heart has a regular rhythm, rate of 80.   Glucoses have been 181, 214 and 184.  O2 sats 98% on room air. Today the  white cell count is 2,600 with 60% neutrophils for a total neutrophil  count of greater than 1200 and the H&H at 8.2/23.8.  The met 7 showed a  potassium 3.4, glucose 141, BUN 3.   ASSESSMENT:  1. Neutropenia.  2. A fluid overload.   PLAN:  Reviewed her chest x-ray done yesterday, which just showed  chronic lung changes and no pneumonia.  Will DC her 150 ml an hour  IV,  give her furosemide 40 mg IV, Hep-Lock her access, get up in the chair  moving and hopefully discharge home tomorrow.  She also be on KCl 20 mg  b.i.d..      Mila Homer. Sudie Bailey, M.D.  Electronically Signed     SDK/MEDQ  D:  03/07/2007  T:  03/07/2007  Job:  [id number]

## 2011-05-08 NOTE — Discharge Summary (Signed)
[name], [name]              ACCOUNT NO.:  [id number]   MEDICAL RECORD NO.:  [id number]          PATIENT TYPE:  INP   LOCATION:  A202                          FACILITY:  [facility]   PHYSICIAN:  Skeet Latch, DO    DATE OF BIRTH:  01/02/1956   DATE OF ADMISSION:  02/11/2007  DATE OF DISCHARGE:  02/27/2008LH                               DISCHARGE SUMMARY   DISCHARGE DIAGNOSES:  1  Gastrointestinal bleed secondary to internal  and external hemorrhoids.  1. Breast carcinoma.  2. Neutropenia.  3. Diabetes mellitus, non-insulin dependent.  4. Anxiety.   BRIEF HOSPITAL COURSE:  Mrs. Keplinger is a 55 year old Caucasian female  who has a history of right breast carcinoma status post mastectomy and  chemotherapy.  She presented complaining of having dark stools,  sometimes bloody for the last 2-3 days.  Over the last few days, she had  2-3 episodes of black tarry stools.  She was having some mild abdominal  pain but denied any vomiting.  The patient was currently on chemotherapy  and is being followed by Dr. Mariel Sleet for breast cancer.  Due to the  patient history, it was decided that the patient needed to be admitted  for evaluation.  The patient was admitted for gastrointestinal bleed.  She was typed and cross-matched with two units or red blood cells.  GI  consultation was obtained.  The patient did have an EGD and a  colonoscopy being performed which showed engorged excoriated external  and internal hemorrhoids.  The patient did have one polyp that was  removed and biopsies pending.  The patient was placed on Protonix,  Imodium and given Anusol suppositories.  The patient has been doing very  well.  No active bleeding and has not complained of any abdominal pain  since admission.  The patient's blood count has remained in the high  90's and the 10.3 range and has remained stable.  Dr. Mariel Sleet was  consulted, and the patient is stable at this time.  The patient was  found to be  neutropenic, this has improved.  The patient also was found  to have an increasing white count that has returned to normal limits at  this time.  At this time, it is felt the patient really wants to go  home.  It is felt patient is stable enough to go home with follow up  with her primary doctor tomorrow and follow up with Dr. Mariel Sleet next  week to restart her chemotherapy.   DISCHARGE MEDICATIONS:  1. Metformin 500 mg p.o. b.i.d.  2. Prozac 60 mg p.o. daily.  3. Xanax 0.5 mg p.o. b.i.d. p.r.n.  4. Trazodone 100 mg p.o. daily.  5. Levaquin 750 mg p.o. daily x5 days.  6. Phenergan 12.5 mg 1-2 tablets p.o. q.4-6 h p.r.n.  7. Protonix 40 mg p.o. daily.  8. Azole suppositories at bedtime for 10 days.   CONDITION ON DISCHARGE:  Stable.   DISCHARGE INSTRUCTIONS:  1. Diet:  Low-sodium, heart-healthy diet.  2. Activity:  The patient is to increase her activity slowly.  3. The patient is instructed to  follow up with her primary care      physician which she states she has an appointment tomorrow on      February 17, 2007.  The patient is also instructed to follow up      with Dr. Mariel Sleet in one week to restart her chemotherapy.      Skeet Latch, DO  Electronically Signed     SM/MEDQ  D:  02/16/2007  T:  02/16/2007  Job:  [id number]   cc:   Ladona Horns. Mariel Sleet, MD  Fax: [phone]

## 2011-05-08 NOTE — H&P (Signed)
NAMEROSALBA, TOTTY              ACCOUNT NO.:  [id number]   MEDICAL RECORD NO.:  [id number]          PATIENT TYPE:  INP   LOCATION:  A338                          FACILITY:  [facility]   PHYSICIAN:  Mila Homer. Sudie Bailey, M.D.DATE OF BIRTH:  23-Dec-1955   DATE OF ADMISSION:  03/04/2007  DATE OF DISCHARGE:  LH                              HISTORY & PHYSICAL   This 55 year old began to get generalized myalgias starting 3 or 4 days  ago.  She felt weak and chest hurting, cough with clear sputum, had  fever and chills, nausea, some diarrhea, decreased appetite and some  breathing difficulty.   She currently is being treated for breast cancer with chemotherapy, last  chemotherapy 2 weeks ago.  The exact drugs that have been used are not  available to may.  She has used ibuprofen for the aches but 600 mg  ibuprofen is not taking care of them.   She has a long and fairly complicated medical history.  Currently she is  on:  1. Metformin 5 mg b.i.d. for diabetes.  2. Prozac 60 mg daily for depression.  3. Alprazolam 0.5 mg b.i.d. for anxiety.  4. Trazodone 100 mg q.h.s. for depression.  5. Phenergan 12.5 mg may also be used for nausea.  6. Protonix 40 mg daily or Prilosec 20 mg daily.   She smoked for 25 years, averaging a pack a day, but stopped about 10  years ago.   She has had no other surgery than her right breast cancer surgery.  She  also had a Port-A-Cath placed by Dr. Lovell Sheehan at the [facility].   Family history shows that she is living with her husband.  She has three  children, two girls and a boy.  Her son is currently in the [employer].   Most recent blood tests done 10 days ago through [facility] showed a white  cell count of 5300, H&H 11.4/33.7, platelet count 3000.  She 75%  neutrophils, 17 lymphs.   ADMISSION EXAM:  GENERAL:  Extremely weak middle-aged woman who had to  put on a heavy winter coat because she was shivering so hard in the  office.  She is too weak to the  weight on, but the blood pressure is  62/40, pulse 92.  Recheck by MD 64/46, pulse 120.  She appeared to be  alert and oriented.  She is well-developed and obese.  NECK:  Negative anterior nodes.  LUNGS:  The lungs appeared clear throughout, moving air well.  There are  no intercostal retraction, no use of accessory muscles for respiration.  HEART:  The heart had a regular rhythm with tachycardic, rate of 120 on  my exam.  ABDOMEN:  Soft and obese without hepatosplenomegaly or mass.  There was  some tenderness of the right CVA but not the left CVA.  There is no  edema in the ankles.   ADMISSION DIAGNOSES:  1. Rule out sepsis.  2. Chemotherapy for breast carcinoma.  3. Right breast cancer.  4. Type 2 diabetes.  5. Depression.  6. Insomnia.   The plan of treatment  includes high-volume IV fluids.  We will get blood  cultures x2 addition in addition to her urine culture.  CBC and MET-7  are likewise pending, as is the chest x-ray.  Given her PENICILLIN  allergy, we will start her on vancomycin as per pharmacy IV.  Use  ibuprofen for aches 800 mg p.o. t.i.d. and Tylenol for pain and fever.  I have asked Dr. Mariel Sleet of oncology to consult on her.      Mila Homer. Sudie Bailey, M.D.  Electronically Signed     SDK/MEDQ  D:  03/04/2007  T:  03/05/2007  Job:  [id number]

## 2011-05-08 NOTE — Op Note (Signed)
[name], [name]              ACCOUNT NO.:  [id number]   MEDICAL RECORD NO.:  [id number]          PATIENT TYPE:  AMB   LOCATION:  DAY                           FACILITY:  [facility]   PHYSICIAN:  Dalia Heading, M.D.  DATE OF BIRTH:  09/15/1956   DATE OF PROCEDURE:  12/31/2006  DATE OF DISCHARGE:                               OPERATIVE REPORT   PREOPERATIVE DIAGNOSIS:  Right breast carcinoma, need for central venous  access.   POSTOPERATIVE DIAGNOSIS:  Right breast carcinoma, need for central  venous access.   PROCEDURE:  Port-A-Cath insertion.   SURGEON:  Dr. Franky Macho.   ANESTHESIA:  MAC.   INDICATIONS:  The patient is a 55 year old white female who is about to  undergo chemotherapy for right breast carcinoma.  Risks and benefits of  the procedure including bleeding, infection, pneumothorax were fully  explained to the patient, who gave informed consent.   PROCEDURE NOTE:  The patient was placed in Trendelenburg position after  left upper chest was prepped and draped in the usual sterile technique  with Betadine.  Surgical site confirmation was performed.  1% Xylocaine  was used for local anesthesia.   A transverse incision was made in the left infraclavicular region.  Subcutaneous pocket was then formed.  A needle was advanced into the  left subclavian vein without difficulty.  Guidewire then advanced into  the right atrium under fluoroscopic guidance.  Introducer and peel-away  sheath was then placed over the guidewire.  The catheter was then  inserted in through the peel-away sheath and the peel-away sheath was  removed.  The catheter was sized and attached to the port and the port  placed in subcutaneous pocket.  Adequate positioning was confirmed by  fluoroscopy.  The port was then flushed with 3000 units of heparin.  Subcutaneous layer was reapproximated using a 3-0 Vicryl interrupted  suture.  The skin was closed using a 4-0 Vicryl subcuticular suture.  Dermabond was then applied.   All tape and needle counts correct and end of the procedure.  The  patient was transferred back to PACU in stable condition.  Chest x-ray  will be performed at that time.   COMPLICATIONS:  None.   SPECIMEN:  None.   BLOOD LOSS:  Minimal.      Dalia Heading, M.D.  Electronically Signed     MAJ/MEDQ  D:  12/31/2006  T:  12/31/2006  Job:  [id number]   cc:   Ladona Horns. Mariel Sleet, MD  Fax: [phone]   [facility]

## 2011-05-08 NOTE — H&P (Signed)
NAME:  [name], CARRANCO              ACCOUNT NO.:  [id number]   MEDICAL RECORD NO.:  [id number]          PATIENT TYPE:  AMB   LOCATION:  DAY                           FACILITY:  [facility]   PHYSICIAN:  Dalia Heading, M.D.  DATE OF BIRTH:  06-11-1956   DATE OF ADMISSION:  DATE OF DISCHARGE:  LH                              HISTORY & PHYSICAL   CHIEF COMPLAINT:  Right breast carcinoma, need for central venous  access.   HISTORY OF PRESENT ILLNESS:  The patient is a 55 year old white female  who was recently treated with a right partial mastectomy, sentinel lymph  node biopsy for a right breast carcinoma.  She now presents for a Port-A-  Cath insertion to undergo chemotherapy.   PAST MEDICAL HISTORY:  As noted above, depression and anxiety.   PAST SURGICAL HISTORY:  As noted above, tubal ligation.   CURRENT MEDICATIONS:  Prozac 60 mg p.o. daily, Clarinex p.r.n., Xanax  p.r.n., trazodone 100 mg p.o. daily.   ALLERGIES:  PENICILLIN.   REVIEW OF SYSTEMS:  Noncontributory.   PHYSICAL EXAMINATION:  GENERAL:  The patient is a well-developed, well-  nourished white female in no acute distress.  LUNGS:  Clear to auscultation with equal breath sounds bilaterally.  HEART:  Examination reveals a regular rate and rhythm without S3, S4, or  murmurs.  BREASTS:  Right breast examination reveals a well-healed surgical scar  in the upper, inner or outer quadrant.   IMPRESSION:  Right breast carcinoma.   PLAN:  The patient is scheduled for a Port-A-Cath insertion on December 31, 2006.  The risks and benefits of the procedure including bleeding,  infection, and the possibility of a pneumothorax were fully explained to  the patient, gave informed consent.  my office to short stay at [facility] to Dr. care  sternum and to direct and [facility]      Dalia Heading, M.D.  Electronically Signed     MAJ/MEDQ  D:  12/23/2006  T:  12/23/2006  Job:  [id number]   cc:   Dalia Heading,  M.D.  Fax: [phone]   Short Stay - Lynann Bologna. Mariel Sleet, MD  Fax: [phone]   [facility]

## 2011-05-08 NOTE — H&P (Signed)
[name], [name]              ACCOUNT NO.:  [id number]   MEDICAL RECORD NO.:  [id number]           PATIENT TYPE:  AMB   LOCATION:                                FACILITY:  [facility]   PHYSICIAN:  Dalia Heading, M.D.  DATE OF BIRTH:  [birth year]   DATE OF ADMISSION:  DATE OF DISCHARGE:  LH                              HISTORY & PHYSICAL   CHIEF COMPLAINT:  Right breast carcinoma.   HISTORY OF PRESENT ILLNESS:  The patient is a 55 year old white female  who is referred for evaluation and treatment of a right breast mass.  This was found on physical examination and mammography.  Biopsy reveals  malignancy.  No family history of breast carcinoma is noted.  There is  no nipple discharge noted.   PAST MEDICAL HISTORY:  Includes depression/anxiety.   PAST SURGICAL HISTORY:  Tubal ligation in the past.   CURRENT MEDICATIONS:  1. Prozac 60 mg p.o. daily.  2. Baby aspirin (which she is holding).  3. Clarinex p.r.n.  4. Xanax 0.5 mg p.o. q.8 h p.r.n.  5. Trazodone 100 mg p.o. daily.   ALLERGIES:  PENICILLIN.   REVIEW OF SYSTEMS:  Noncontributory.   PHYSICAL EXAMINATION:  GENERAL:  The patient is a well-developed, well-  nourished white female in no acute distress.  NECK:  Supple without lymphadenopathy.  LUNGS:  Clear to auscultation with equal breath sounds bilaterally.  HEART:  Examination reveals regular rate and rhythm without S3, S4, or  murmurs.  BREASTS:  Right breast examination reveals a dominant mass noted at the  1 o'clock position.  No nipple discharge or dimpling is noted.  The  axilla is negative for palpable nodes.  Left breast examination reveals  no dominant mass, nipple discharge, or dimpling.  The axilla is negative  for palpable nodes.   IMPRESSION:  Right breast carcinoma.   PLAN:  The patient is scheduled for a right partial mastectomy, sentinel  lymph node biopsy, possible axillary dissection on December 01, 2006.  The risks and benefits of the procedure  including bleeding, infection,  arm swelling, the possible need for a full axillary dissection, and the  possibility of re-excision of the cancer were fully explained to the  patient, who gave informed consent.      Dalia Heading, M.D.  Electronically Signed     MAJ/MEDQ  D:  11/25/2006  T:  11/25/2006  Job:  [id number]

## 2011-05-08 NOTE — Discharge Summary (Signed)
NAMENORLENE, LANES              ACCOUNT NO.:  [id number]   MEDICAL RECORD NO.:  [id number]          PATIENT TYPE:  INP   LOCATION:  A338                          FACILITY:  [facility]   PHYSICIAN:  Mila Homer. Sudie Bailey, M.D.DATE OF BIRTH:  1956/07/06   DATE OF ADMISSION:  03/04/2007  DATE OF DISCHARGE:  03/21/2008LH                               DISCHARGE SUMMARY   HOSPITAL COURSE:  This 55 year old was admitted to the hospital with  neutropenia secondary to cancer chemotherapy.  She went to develop a  pneumonia before she was discharged.  She had an 8-day hospital course  extending from March 14 to March 11, 2007.  Vital signs remained stable.   Admission white cell count was 600; the following day, it was up to 1100  and at that time she had 38% neutrophils.  Her H&H were 7.7/22.3 and  platelet count 82,000.  Check for influenza A and B was negative on  nasal washings.  Urine culture was negative.  Sputum culture that was  run was not indicative of lung finding and blood culture x1 was  negative.  Recheck respiratory culture showed normal oropharyngeal  flora.  Final CBC the day of discharge showed a white cell count of 3400  with 75% neutrophils and 11% lymphocytes, H&H 9.3/26.8 and platelet  count 214,000.   Her admission portable chest x-ray showed a ill-defined patchy airspace  process in the left lower lobe concerning for developing infiltrate or  pneumonia.  She had bilateral bibasilar atelectasis.  Repeat chest x-  ray 2 days later showed minimal bronchitic changes with chronic right  basilar atelectasis.  Several days later, a CT angiogram of the chest  showed no pulmonary emboli, but showed faint hazy bilateral infiltrates  in the upper and lower lobes as well as the right middle lobe suggestive  of pneumonia.   She was admitted to the hospital with consult to her oncologist, Dr.  Mariel Sleet.  She was given IV normal saline 500-mL bolus due to admission  hypotension.  She  was given put on 2 mL an hour, then 150 mL an hour,  given ibuprofen 800 mg q.8 h. for aches and sliding-scale sensitive  insulin for her diabetes, Prozac 60 mg daily, alprazolam 0.5 mg q.i.d.  for anxiety, trazodone 100 mg nightly, with Zofran 4 mg IV q.4 h. p.r.n.  nausea.  She was given morphine sulfate q.3 h. for severe pain.  She was  started on vancomycin and tobramycin for infection.  She has been typed  and crossed for 2 units of packed cells due to her anemia and during the  hospitalization, a transfusion of both of these.   Her 4th day, she seemed to be somewhat tight in the chest, had a little  bit of ankle edema, so she was given furosemide 40 mg IV STAT and her IV  was discontinued and switched to Hep-Lock.  She also had some  hypokalemia, so was put on Kay Ciel 20 mg b.i.d.   D-dimer was checked, which was 1.56, following which we had a CT of the  lung, which was negative  for pulmonary emboli.  we gave her Tussionex a  teaspoon p.o. q.8 h. and by her 6th day, stopped her vancomycin and  Tobramycin and switched her to Levaquin 500 mg daily and the Lovenox  that had been started the day before was switched to just 40 mg daily as  prophylaxis.  She was given albuterol nebulizer treatments q.4 h., Peri-  Colace b.i.d. for constipation, Dulcolax suppository as needed and then  when she was somewhat jittery on the albuterol, she was switched to  Xopenex nebulizer treatments q.4 h.   She gradually improved such that by her 8th day, she was feeling much  better and ready for discharge home.   DISCHARGE MEDICATIONS:  She was discharged home on:  1. Levaquin 500 mg daily (10, no refills).  2. Tussionex a teaspoon b.i.d. for cough (8 ounces, Rx x0).  3. Maxair AutoHaler two puffs q.4 h. (refill x11) or Xopenex ampules      in her nebulizer q.4 h. (82-month supply of 5-times-a-day      treatments, refill x11).  4. Metformin 500 mg b.i.d.  5. Prozac 60 mg daily.  6. Alprazolam 0.5  mg b.i.d.  7. Trazodone 100 mg nightly.  8. Protonix 40 mg daily.  9. Phenergan 12.5 mg for nausea.   FINAL DISCHARGE DIAGNOSES:  1. Neutropenia secondary to cancer chemotherapy.  2. Malignancy of the right breast, status post surgery and with      ongoing chemotherapy.  3. Bilateral pneumonia, probably viral.  4. Uncontrolled type 2 diabetes.  5. Hypokalemia.  6. Hypotension/dehydration.  7. Depression.  8. Chronic anxiety.  9. Chronic obstructive pulmonary disease.  10.Personal history of tobacco use.   FOLLOWUP:  The patient will see me in the office in 3 days.  She has had  O2 in the hospital and will receive it at home as well if O2 SAT  warrants this (to be done today).  I have discussed with Discharge  Planning at length and talked to [place] (who will supply  her nebulizer, but actually her O2 as well.      Mila Homer. Sudie Bailey, M.D.  Electronically Signed     SDK/MEDQ  D:  03/11/2007  T:  03/11/2007  Job:  [id number]

## 2011-05-08 NOTE — Group Therapy Note (Signed)
[name], [name]              ACCOUNT NO.:  [id number]   MEDICAL RECORD NO.:  [id number]          PATIENT TYPE:  INP   LOCATION:  A338                          FACILITY:  [facility]   PHYSICIAN:  Mila Homer. Sudie Bailey, M.D.DATE OF BIRTH:  Nov 26, 1956   DATE OF PROCEDURE:  DATE OF DISCHARGE:                                 PROGRESS NOTE   SUBJECTIVE:  She is still feeling short of breath, but mainly from  coughing extensively, just can not control the coughing.  Otherwise she  is better.   OBJECTIVE:  Temperature 97.9, pulse 64, respiratory rate 20, blood  pressure 121/65.  She is actively coughing.  With decreased breath sounds throughout the  lungs, but she is moving air well.  No intercostal fractions, no use of  accessory muscles with respiration.  HEART:  Regular rhythm rate about 60.  There is no edema in the ankles.   Sugars have been 162, 190, 235.  D-dimer is 1.58.   ASSESSMENT:  1. Probable bronchitis.  2. Neutropenia.  3. Malignancy of the breast.  4. Diabetes mellitus.   PLAN:  Start Lovenox today subq b.i.d. and check a CT of the chest PE  protocol, supposed to be started stat.  If this is negative we will just  try to get her up to get her home.      Mila Homer. Sudie Bailey, M.D.  Electronically Signed     SDK/MEDQ  D:  03/09/2007  T:  03/09/2007  Job:  [id number]

## 2011-05-08 NOTE — Group Therapy Note (Signed)
[name]              ACCOUNT NO.:  [id number]   MEDICAL RECORD NO.:  [id number]          PATIENT TYPE:  INP   LOCATION:  A338                          FACILITY:  [facility]   PHYSICIAN:  Mila Homer. Sudie Bailey, M.D.DATE OF BIRTH:  01-Dec-1956   DATE OF PROCEDURE:  03/05/2007  DATE OF DISCHARGE:                                 PROGRESS NOTE   SUBJECTIVE:  [name] says that she feels much better today. She  is not having chills. She does not that she is coughing up some thick  sputum now but just in scant quantities.   OBJECTIVE:  GENERAL:  She is sitting up in bed. Her color looks good and  she is not shaking like she was in the office yesterday.  VITAL SIGNS:  Temperature 98.8, pulse 75, respiratory rate 20, blood  pressure 77/43, 82/48, and 110/56.  LUNGS:  Clear throughout.  HEART:  Regular rhythm with rate of about 80.  ABDOMEN:  No tenderness.   LABORATORY DATA:  White blood cell count today is 1,100 with 38%  neutrophils, 38 lymphs, 20 mono's, up from 0.6 yesterday. Her hemoglobin  and hematocrit is 7.7 and 22.3. Platelet count 82,000. Her one blood  culture at present is negative for growth. Urine is likewise, negative.   Admission chest x-ray showed what appeared to be an early left lower  lobe pneumonia.   ASSESSMENT:  1. Neutropenia.  2. Anemia.  3. Thrombocytopenia.  4. Status post cancer chemotherapy.  5. Breast cancer.  6. Question left lower lobe pneumonia.   PLAN:  She was seen by Dr. Mariel Sleet of hematology/oncology last night.  She is still on vancomycin. In addition, she is on Tobramycin 160 mg IV  q.12 hours and this needs to be continued. We are transfusing her a unit  of packed cells today and recheck her CBC and met 7 in the morning.  Chest x-ray in 2 days, to see if she really has infiltrate  developing now, she will have more white cells. Her nausea is pretty  much clear, so she will go on a regular diet today, which should be fine  since  her total neutrophil count is up to 400 and should be at 500 in  short order. Discussed all of this with her at length.      Mila Homer. Sudie Bailey, M.D.  Electronically Signed     SDK/MEDQ  D:  03/05/2007  T:  03/06/2007  Job:  [id number]

## 2011-05-08 NOTE — Group Therapy Note (Signed)
[name], SIMKO              ACCOUNT NO.:  [id number]   MEDICAL RECORD NO.:  [id number]          PATIENT TYPE:  INP   LOCATION:  A338                          FACILITY:  [facility]   PHYSICIAN:  Mila Homer. Sudie Bailey, M.D.DATE OF BIRTH:  12-14-1956   DATE OF PROCEDURE:  DATE OF DISCHARGE:                                 PROGRESS NOTE   SUBJECTIVE:  She is feeling poorly this morning.  She spent a lot of  time last night coughing.  The patient had some jitteriness with her neb  treatments.   OBJECTIVE:  Temperature is 99.1. pulse 78, respiratory rate 20.  Blood  pressure 136/71.  She is bundled up under the covers, and seems to be  having some shakes.  LUNGS:  However, are clear throughout.  HEART:  Regular rhythm, rate of 78.  There are no intercostal retractions.  No use of accessory muscles for  respiratory.  Color is good.  She is a well-developed, well-nourished, oriented, alert, in distress  just due to feeling cold and fatigued.  Sugars in the last 24 hours have been 161, 256, and 223.  She had the CT scan of the lung with PE protocol yesterday.  This showed  faint, fairly extensive bilateral pulmonary infiltrate.   ASSESSMENT:  1. Neutropenia.  2. Bronchitis.  3. Type 2 diabetes.  4. Malignancy of the breast.  5. Question pneumonia, probably bronchitis.   PLAN:  Recheck a CBC today.  She has been up in a chair.  We will have  Respiratory instruct her on how to use a handheld inhaler.      Mila Homer. Sudie Bailey, M.D.  Electronically Signed     SDK/MEDQ  D:  03/10/2007  T:  03/10/2007  Job:  [id number]

## 2011-05-08 NOTE — Group Therapy Note (Signed)
[name], [name]              ACCOUNT NO.:  [id number]   MEDICAL RECORD NO.:  [id number]          PATIENT TYPE:  INP   LOCATION:  A338                          FACILITY:  [facility]   PHYSICIAN:  Catalina Pizza, M.D.        DATE OF BIRTH:  1956-07-28   DATE OF PROCEDURE:  03/06/2007  DATE OF DISCHARGE:                                 PROGRESS NOTE   SUBJECTIVE:  [name] is a 55 year old white female who is going to  chemotherapy for breast cancer and was significantly neutropenic, felt  to have some upper respiratory problem, either bronchitis or pneumonia.  She was admitted with questionable symptoms of early sepsis with  significantly low blood pressure.  The patient states at this time that  she is feeling much better except for complaints of heart burn and  reflux for which she states Protonix during the last admission helped  significantly.  Maalox last evening did not help at this time.   OBJECTIVE:  VITAL SIGNS:  T-max 98.3.  Blood pressure 94/59, pulse 67,  respirations 20-22.  CBGs have been 283 and 150, respectively.  Saturation 96% on room air.  GENERAL:  White female lying in bed in no acute distress.  HEENT:  Unremarkable.  Mucous membranes are moist.  LUNGS:  Good air movement throughout.  No accessory muscle use.  Not  appreciating any crackles or rhonchi at this time.  HEART:  Regular rate and rhythm, no murmurs, rubs, or gallops.  ABDOMEN:  Soft, nontender, nondistended. Positive bowel sounds.  EXTREMITIES:  No lower extremity edema.  NEUROLOGIC:  Alert and oriented x3.   LABORATORY DATA:  Obtained today: BMET shows sodium 143, potassium 3.4,  chloride 114, CO2 28, glucose 140, BUN 3, creatinine 0.65.  CBC shows  white count 1.4, hemoglobin 8.0, platelet count 76.  Tobramycin level  4.4.  Vancomycin trough is 11.7.  Blood cultures are negative to date.  Sputum culture was inadequate for evaluation.   Images obtained:  Chest x-ray shows minimal bronchitic change  with  chronic right basilar atelectasis.  No specific signs of pneumonia.   IMPRESSION:  This is a 55 year old white female with infection of  unknown origin, question related to lungs given coughing and fever and  chills, also neutropenic.   ASSESSMENT AND PLAN:  1. Questionable pneumonia or bronchitis.  Continue with antibiotics as      previously prescribed.  Unknown specific source of infection, but      question if she had some mild sepsis which is much better at this      time.  2. Hypotension.  She chronically runs low but is remaining relatively      stable at this time.  3. Right breast cancer, going through chemotherapy, and will follow up      with Dr. Mariel Sleet for this.  4. Gastroesophageal reflux disease.  Will start her on Protonix 40 mg      IV daily.  5. Hypokalemia, very mild.  Will give one dose of K-Dur with food at      her next meal and  alleviate some of that abdominal pain.  6. Pancytopenia.  She has a low white count, a low hemoglobin, and low      platelet count.  She is apparently getting Neulasta to build up her      count, but will continue to support her routinely with this.  Will      continue workup while looking for sources of infection given her      neutropenia.      Catalina Pizza, M.D.  Electronically Signed     ZH/MEDQ  D:  03/06/2007  T:  03/06/2007  Job:  [id number]

## 2011-05-08 NOTE — Procedures (Signed)
[name], [name]              ACCOUNT NO.:  [id number]   MEDICAL RECORD NO.:  [id number]          PATIENT TYPE:  REC   LOCATION:  RAD                           FACILITY:  [facility]   PHYSICIAN:  Peter C. Eden Emms, MD, FACCDATE OF BIRTH:  18-Jun-1956   DATE OF PROCEDURE:  DATE OF DISCHARGE:                                ECHOCARDIOGRAM   Follow up LV function post chemo.   The left ventricular cavity size was normal.  EF was 60%.  There were no  regional wall motion abnormalities.  Mitral valve was mildly thickened  with trivial MR.  The left atrium and right cardiac chambers were  normal.  There was no evidence of pulmonary hypertension.  The aortic  valve was trileaflet and structurally normal.  Aortic root was normal.  Subcostal imaging revealed no atrial septal defect.  No source of  embolism, no effusion.   FINAL IMPRESSION:  An essentially normal 2D echocardiogram with trivial  mitral regurgitation.  Normal ejection fraction at 60%.      Noralyn Pick. Eden Emms, MD, [facility]  Electronically Signed     PCN/MEDQ  D:  03/29/2007  T:  03/29/2007  Job:  [id number]   cc:   Ladona Horns. Mariel Sleet, MD  Fax: [phone]

## 2011-05-08 NOTE — Op Note (Signed)
[name], [name]              ACCOUNT NO.:  [id number]   MEDICAL RECORD NO.:  [id number]          PATIENT TYPE:  INP   LOCATION:  A202                          FACILITY:  [facility]   PHYSICIAN:  R. Roetta Sessions, M.D. DATE OF BIRTH:  Dec 22, 1955   DATE OF PROCEDURE:  02/14/2007  DATE OF DISCHARGE:                               OPERATIVE REPORT   Esophagogastroduodenoscopy, diagnostic, followed by colonoscopy with  biopsy.   INDICATIONS FOR PROCEDURE:  The patient is a pleasant 55 year old lady  with breast cancer, recently undergoing chemotherapy.  She was admitted  to the hospital with some dark/melenic-appearing stool, along with some  fresh blood per rectum.  She has had some chronic diarrhea, worsening  recently with chemotherapy with Neupogen.  Her granulocyte count has  rebounded.  Stool for Clostridium difficile toxin assay, lactoferrin and  culture all have come back negative.  She received 1 unit packed red  blood cells.  She has never had a colonoscopy.  There is no family  history colorectal neoplasia.  EGD and colonoscopy are now being done.  This approach has discussed the patient at length.  Potential risks,  benefits and was have been reviewed, questions answered.  She is  agreeable.  Please see documentation in the medical record.   PROCEDURE NOTE:  O2 saturation, blood pressure, pulse and respirations  were monitored throughout the entire procedure.  Conscious sedation with  IV Versed and Demerol in incremental doses.   INSTRUMENT:  Pentax video chip system.   FINDINGS:  Esophagogastroduodenoscopy:  Examination of the tubular  esophagus revealed a couple distal esophageal erosions straddling the EG  junction.  There was no Barrett's esophagus or evidence neoplasm.  The  tubular esophagus was widely patent and easily traversed with the scope.   Stomach:  Gastric cavity was empty and insufflated well with air.  Throughout examination of the gastric mucosa and  retroflexed view of the  proximal stomach and esophagogastric junction demonstrated no  abnormalities.  Pylorus was patent, easily traversed.  Examination of  the bulb and second portion revealed no abnormalities.   THERAPEUTIC/DIAGNOSTIC MANEUVERS:  None.   The patient tolerated the procedure well and was prepared for  colonoscopy.  Digital rectal exam revealed no abnormalities.   ENDOSCOPIC FINDINGS:  Prep was good.  Examination of the colonic mucosa  was undertaken well as the scope was advanced from the rectosigmoid  junction through the left, transverse and right colon to the area of the  appendiceal orifice, ileocecal valve and cecum.  The structures were  well seen and photographed for the record.  The terminal ileum was  intubated to 10 cm.  From this level, scope was slowly withdrawn.  All  previously mucosal surfaces were again seen.  The patient had a 3-mm  polyp in the cecum which was cold biopsied/removed.  Otherwise, colonic  mucosa appeared normal.  Terminal ileum mucosa appeared normal.  The  scope was pulled out of the rectum.  A thorough examination of the  rectal mucosa including en face view of the anal canal and retroflexed  view of the distal rectum  demonstrated excoriated internal anal canal  and external hemorrhoids.  The internal hemorrhoids were engorged.  Otherwise, rectal mucosa appeared normal.  The patient tolerated both  procedures well and was reactive to endoscopy.   IMPRESSION:  Esophagogastroduodenoscopy:  Distal esophageal erosions  consistent with mild erosive reflux esophagitis, otherwise normal  esophagus, stomach, D1/D2.   Colonoscopy findings:  1. Engorged, excoriated external and internal hemorrhoids.  These      hemorrhoids appeared to have bled recently.  Otherwise normal      rectum.  2. Diminutive polyp in the cecum, cold biopsied/removed. The remainder      of the colonic mucosa appeared normal.  Normal terminal ileum.   SPECIMEN:  I  expect the patient has bled from hemorrhoids recently.  Otherwise findings of endoscopic evaluation today are reassuring.   RECOMMENDATIONS:  1. Would continue Protonix 40 mg orally daily.  2. Imodium 2 mg q.6 h. p.r.n. diarrhea.  3. A course of Anusol HC suppositories 1 per rectum at bedtime for 10      days.  4. Follow up on path.  5. If she displays evidence of recurrent bleeding, then would consider      imaging her small bowel via Givens capsule study.      Jonathon Bellows, M.D.  Electronically Signed     RMR/MEDQ  D:  02/14/2007  T:  02/15/2007  Job:  [id number]   cc:   Ladona Horns. Mariel Sleet, MD  Fax: [phone]

## 2011-07-16 ENCOUNTER — Other Ambulatory Visit (HOSPITAL_COMMUNITY): Payer: Self-pay | Admitting: Family Medicine

## 2011-07-16 DIAGNOSIS — C50919 Malignant neoplasm of unspecified site of unspecified female breast: Secondary | ICD-10-CM

## 2011-07-29 ENCOUNTER — Inpatient Hospital Stay (HOSPITAL_COMMUNITY)
Admission: RE | Admit: 2011-07-29 | Discharge: 2011-07-29 | Payer: Self-pay | Source: Ambulatory Visit | Attending: Family Medicine | Admitting: Family Medicine

## 2011-07-29 ENCOUNTER — Ambulatory Visit (HOSPITAL_COMMUNITY)
Admission: RE | Admit: 2011-07-29 | Discharge: 2011-07-29 | Disposition: A | Discharge status: Home / Self Care | Payer: PRIVATE HEALTH INSURANCE | Source: Ambulatory Visit | Attending: Family Medicine | Admitting: Family Medicine

## 2011-07-29 DIAGNOSIS — C50919 Malignant neoplasm of unspecified site of unspecified female breast: Secondary | ICD-10-CM

## 2011-07-29 DIAGNOSIS — Z853 Personal history of malignant neoplasm of breast: Secondary | ICD-10-CM | POA: Insufficient documentation

## 2011-08-05 ENCOUNTER — Inpatient Hospital Stay (HOSPITAL_COMMUNITY): Admission: RE | Admit: 2011-08-05 | Payer: Self-pay | Source: Ambulatory Visit

## 2011-09-29 LAB — DIFFERENTIAL
Basophils Absolute: 0
Basophils Absolute: 0
Eosinophils Absolute: 0.1
Eosinophils Absolute: 0.2
Eosinophils Relative: 1
Eosinophils Relative: 5
Lymphocytes Relative: 22
Lymphs Abs: 1
Lymphs Abs: 1.1
Lymphs Abs: 1.2
Monocytes Absolute: 0.2
Monocytes Relative: 8
Neutrophils Relative %: 62
Neutrophils Relative %: 62
Neutrophils Relative %: 67

## 2011-09-29 LAB — CBC
HCT: 31.6 — ABNORMAL LOW
HCT: 33.7 — ABNORMAL LOW
HCT: 37
MCV: 90.3
MCV: 91.3
MCV: 91.9
Platelets: 188
Platelets: 191
Platelets: 234
RBC: 3.47 — ABNORMAL LOW
RBC: 4.1
RDW: 12.4
RDW: 12.9
WBC: 3.6 — ABNORMAL LOW
WBC: 4.4
WBC: 5.5

## 2011-09-29 LAB — URINALYSIS, ROUTINE W REFLEX MICROSCOPIC
Hgb urine dipstick: NEGATIVE
Ketones, ur: NEGATIVE
pH: 5.5

## 2011-09-29 LAB — COMPREHENSIVE METABOLIC PANEL
Albumin: 4.2
Calcium: 9.3
Creatinine, Ser: 0.93
GFR calc non Af Amer: >60
Potassium: 4.6
Total Bilirubin: 0.5
Total Protein: 6.6

## 2011-09-29 LAB — TYPE AND SCREEN: Antibody Screen: NEGATIVE

## 2012-03-07 ENCOUNTER — Encounter: Payer: Self-pay | Admitting: Internal Medicine

## 2012-03-08 ENCOUNTER — Ambulatory Visit (INDEPENDENT_AMBULATORY_CARE_PROVIDER_SITE_OTHER): Payer: PRIVATE HEALTH INSURANCE | Admitting: Gastroenterology

## 2012-03-08 ENCOUNTER — Encounter: Payer: Self-pay | Admitting: Gastroenterology

## 2012-03-08 VITALS — BP 117/72 | HR 84 | Temp 97.5°F | Ht 67.0 in | Wt 237.4 lb

## 2012-03-08 DIAGNOSIS — Z86010 Personal history of colonic polyps: Secondary | ICD-10-CM

## 2012-03-08 DIAGNOSIS — K219 Gastro-esophageal reflux disease without esophagitis: Secondary | ICD-10-CM

## 2012-03-08 DIAGNOSIS — R1314 Dysphagia, pharyngoesophageal phase: Secondary | ICD-10-CM

## 2012-03-08 DIAGNOSIS — Z860101 Personal history of adenomatous and serrated colon polyps: Secondary | ICD-10-CM | POA: Insufficient documentation

## 2012-03-08 DIAGNOSIS — R1310 Dysphagia, unspecified: Secondary | ICD-10-CM

## 2012-03-08 DIAGNOSIS — R1319 Other dysphagia: Secondary | ICD-10-CM | POA: Insufficient documentation

## 2012-03-08 NOTE — Progress Notes (Signed)
Faxed to PCP

## 2012-03-08 NOTE — Assessment & Plan Note (Signed)
Due for surveillance colonoscopy.  I have discussed the risks, alternatives, benefits with regards to but not limited to the risk of reaction to medication, bleeding, infection, perforation and the patient is agreeable to proceed. Written consent to be obtained.  Given polypharmacy, she will receive Phenergan 25mg  IV 30 minutes before procedure.

## 2012-03-08 NOTE — Progress Notes (Signed)
Primary Care Physician:  [facility] health Department  Primary Gastroenterologist:  Roetta Sessions, MD   Chief Complaint  Patient presents with  . Colonoscopy    HPI:  [name] is a 56 y.o. female here To schedule surveillance colonoscopy. Last colonoscopy was done in February of 2008. She had a tubular adenoma removed. She was recommended to have a followup colonoscopy in 3 years. She tells me she received the letter but did not have medical insurance at that time. She therefore did not pursue surveillance colonoscopy then. She recently established care at the [facility] Department.  She advised them that she was overdue for colonoscopy therefore presents today.  Nexium controls heartburn. Sometimes takes rolaids/tums. Now that she is on Medicaid she will have to switch to omeprazole. Some problems swallow rice/bread. Has had to induce vomiting at times because the bolus would not go down. No abdominal pain. No n/v. Changed diet, increased fiber. BM now softer stools, 1-2 per day. No blood in stool or melena.   Current Outpatient Prescriptions  Medication Sig Dispense Refill  . Albuterol Sulfate (PROVENTIL HFA IN) Inhale into the lungs as needed.      . ALPRAZolam (XANAX) 1 MG tablet Take 1 mg by mouth 2 (two) times daily.       . ARIPiprazole (ABILIFY) 5 MG tablet Take 5 mg by mouth daily. 1/2 tablet      . aspirin 81 MG tablet Take 81 mg by mouth daily.      Marland Kitchen esomeprazole (NEXIUM) 40 MG capsule Take 40 mg by mouth daily before breakfast.      . fish oil-omega-3 fatty acids 1000 MG capsule Take 2 g by mouth daily.      Marland Kitchen FLUoxetine (PROZAC) 40 MG capsule Take 40 mg by mouth daily.      . Fluticasone-Salmeterol (ADVAIR) 250-50 MCG/DOSE AEPB Inhale 1 puff into the lungs every 12 (twelve) hours.      Marland Kitchen glipiZIDE (GLUCOTROL) 5 MG tablet Take 5 mg by mouth daily.      Marland Kitchen lisinopril (PRINIVIL,ZESTRIL) 2.5 MG tablet Take 2.5 mg by mouth daily.      . metFORMIN  (GLUCOPHAGE) 1000 MG tablet Take 1,000 mg by mouth 2 (two) times daily with a meal.      . traZODone (DESYREL) 150 MG tablet Take 150 mg by mouth daily. 1/2 tablet        Allergies as of 03/08/2012 - Review Complete 03/08/2012  Allergen Reaction Noted  . Penicillins Anaphylaxis and Swelling 03/08/2012  . Bee venom  03/08/2012    Past Medical History  Diagnosis Date  . Diabetes mellitus   . COPD (chronic obstructive pulmonary disease) 2011  . GERD (gastroesophageal reflux disease)   . Hypertriglyceridemia     ?  Marland Kitchen Allergic rhinitis   . Breast cancer 2008    chemotherapy, XRT  . Reflux esophagitis 2008    egd  . Adenomatous colon polyp 01/2007  . Anxiety   . Bipolar disorder     [facility]    Past Surgical History  Procedure Date  . Esophagogastroduodenoscopy 02/14/07    distal esophageal erosions   . Colonoscopy 02/14/07    engorged external and internal hemorrhoids with recent bleeding, diminutive polyp in the cecum, tubular adenoma, normal TI  . Mastectomy partial / lumpectomy 2008  . Tubal ligation   . Ectopic pregnancy surgery   . Vaginal hysterectomy 10/2007    with vaginal vault suspension    Family History  Problem Relation Age of Onset  . Colon cancer Paternal Grandmother 82    deceased  . Liver disease Neg Hx     History   Social History  . Marital Status: Legally Separated    Spouse Name: N/A    Number of Children: 3  . Years of Education: N/A   Occupational History  . Not on file.   Social History Main Topics  . Smoking status: Former Smoker -- 0.5 packs/day    Types: Cigarettes  . Smokeless tobacco: Former Neurosurgeon    Quit date: 01/09/1996  . Alcohol Use: No  . Drug Use: No  . Sexually Active: Not on file   Other Topics Concern  . Not on file   Social History Narrative  . No narrative on file      ROS:  General: Negative for anorexia, weight loss, fever, chills, fatigue, weakness. Eyes: Negative for vision changes.  ENT: Negative for  hoarseness, nasal congestion. CV: Negative for chest pain, angina, palpitations, peripheral edema.  Respiratory: Negative for dyspnea at rest. Some baseline dyspnea on exertion with COPD. Denies cough, sputum, wheezing.  GI: See history of present illness. GU:  Negative for dysuria, hematuria, urinary incontinence, urinary frequency, nocturnal urination.  MS: Negative for joint pain, low back pain.  Derm: Negative for rash or itching.  Neuro: Negative for weakness, abnormal sensation, seizure, frequent headaches, memory loss, confusion.  Psych: positive for anxiety, depression, negative for suicidal ideation, hallucinations.  Endo: Negative for unusual weight change.  Heme: Negative for bruising or bleeding. Allergy: Negative for rash or hives.    Physical Examination:  BP 117/72  Pulse 84  Temp(Src) 97.5 F (36.4 C) (Temporal)  Ht 5\' 7"  (1.702 m)  Wt 237 lb 6.4 oz (107.684 kg)  BMI 37.18 kg/m2   General: Well-nourished, well-developed in no acute distress.  Head: Normocephalic, atraumatic.   Eyes: Conjunctiva pink, no icterus. Mouth: Oropharyngeal mucosa moist and pink , no lesions erythema or exudate. Neck: Supple without thyromegaly, masses, or lymphadenopathy.  Lungs: Clear to auscultation bilaterally.  Heart: Regular rate and rhythm, no murmurs rubs or gallops.  Abdomen: Bowel sounds are normal, nontender, nondistended, no hepatosplenomegaly or masses, no abdominal bruits or  hernia , no rebound or guarding.   Rectal: deferred to time of colonoscopy Extremities: No lower extremity edema. No clubbing or deformities.  Neuro: Alert and oriented x 4 , grossly normal neurologically.  Skin: Warm and dry, no rash or jaundice.   Psych: Alert and cooperative, normal mood and affect.

## 2012-03-08 NOTE — Patient Instructions (Signed)
We have scheduled you for a colonoscopy and upper endoscopy. Please see separate instructions. When you switch from Nexium to omeprazole, if you have breakthrough heartburn symptoms please let us know.

## 2012-03-08 NOTE — Assessment & Plan Note (Signed)
Solid food esophageal dysphagia in the setting of chronic control heartburn. She may have an esophageal web, ring or stricture. Recommend EGD with dilation in the near future.  I have discussed the risks, alternatives, benefits with regards to but not limited to the risk of reaction to medication, bleeding, infection, perforation and the patient is agreeable to proceed. Written consent to be obtained.  She will be switching to omeprazole and the near future G-tube Medicaid coverage. If she has breakthrough heartburn symptoms she would need to let us know.

## 2012-03-09 ENCOUNTER — Other Ambulatory Visit: Payer: Self-pay | Admitting: Gastroenterology

## 2012-03-09 DIAGNOSIS — Z86010 Personal history of colonic polyps: Secondary | ICD-10-CM

## 2012-03-09 MED ORDER — PEG 3350-KCL-NA BICARB-NACL 420 G PO SOLR: ORAL | Status: AC

## 2012-03-29 ENCOUNTER — Telehealth: Payer: Self-pay | Admitting: Gastroenterology

## 2012-03-29 MED ORDER — SODIUM CHLORIDE 0.45 % IV SOLN
Freq: Once | INTRAVENOUS | Status: AC
  Administered 2012-03-30: 10:00:00 via INTRAVENOUS

## 2012-03-29 NOTE — Telephone Encounter (Signed)
pts procedure time changed to 10:30- LMOVM with new arrival time of 9:30

## 2012-03-30 ENCOUNTER — Ambulatory Visit (HOSPITAL_COMMUNITY)
Admission: RE | Admit: 2012-03-30 | Discharge: 2012-03-30 | Disposition: A | Discharge status: Home / Self Care | Payer: Medicaid Other | Source: Ambulatory Visit | Attending: Internal Medicine | Admitting: Internal Medicine

## 2012-03-30 ENCOUNTER — Encounter (HOSPITAL_COMMUNITY)
Admission: RE | Disposition: A | Discharge status: Home / Self Care | Payer: Self-pay | Source: Ambulatory Visit | Attending: Internal Medicine

## 2012-03-30 ENCOUNTER — Encounter (HOSPITAL_COMMUNITY): Payer: Self-pay | Admitting: *Deleted

## 2012-03-30 ENCOUNTER — Encounter (HOSPITAL_COMMUNITY): Payer: Self-pay | Admitting: Pharmacy Technician

## 2012-03-30 DIAGNOSIS — Z1211 Encounter for screening for malignant neoplasm of colon: Secondary | ICD-10-CM

## 2012-03-30 DIAGNOSIS — Z86010 Personal history of colonic polyps: Secondary | ICD-10-CM

## 2012-03-30 DIAGNOSIS — Z853 Personal history of malignant neoplasm of breast: Secondary | ICD-10-CM | POA: Insufficient documentation

## 2012-03-30 DIAGNOSIS — K6389 Other specified diseases of intestine: Secondary | ICD-10-CM | POA: Insufficient documentation

## 2012-03-30 DIAGNOSIS — K620 Anal polyp: Secondary | ICD-10-CM | POA: Insufficient documentation

## 2012-03-30 DIAGNOSIS — J449 Chronic obstructive pulmonary disease, unspecified: Secondary | ICD-10-CM | POA: Insufficient documentation

## 2012-03-30 DIAGNOSIS — J4489 Other specified chronic obstructive pulmonary disease: Secondary | ICD-10-CM | POA: Insufficient documentation

## 2012-03-30 DIAGNOSIS — R1310 Dysphagia, unspecified: Secondary | ICD-10-CM

## 2012-03-30 DIAGNOSIS — D126 Benign neoplasm of colon, unspecified: Secondary | ICD-10-CM

## 2012-03-30 DIAGNOSIS — Z79899 Other long term (current) drug therapy: Secondary | ICD-10-CM | POA: Insufficient documentation

## 2012-03-30 DIAGNOSIS — K219 Gastro-esophageal reflux disease without esophagitis: Secondary | ICD-10-CM | POA: Insufficient documentation

## 2012-03-30 DIAGNOSIS — K449 Diaphragmatic hernia without obstruction or gangrene: Secondary | ICD-10-CM | POA: Insufficient documentation

## 2012-03-30 DIAGNOSIS — E119 Type 2 diabetes mellitus without complications: Secondary | ICD-10-CM | POA: Insufficient documentation

## 2012-03-30 DIAGNOSIS — K621 Rectal polyp: Secondary | ICD-10-CM

## 2012-03-30 HISTORY — PX: COLONOSCOPY: SHX5424

## 2012-03-30 SURGERY — COLONOSCOPY
Anesthesia: Moderate Sedation

## 2012-03-30 MED ORDER — SODIUM CHLORIDE 0.9 % IJ SOLN
INTRAMUSCULAR | Status: AC
  Filled 2012-03-30: qty 10

## 2012-03-30 MED ORDER — MEPERIDINE HCL 100 MG/ML IJ SOLN
INTRAMUSCULAR | Status: AC
  Filled 2012-03-30: qty 2

## 2012-03-30 MED ORDER — STERILE WATER FOR IRRIGATION IR SOLN
Status: DC | PRN
  Administered 2012-03-30: 10:00:00

## 2012-03-30 MED ORDER — PROMETHAZINE HCL 25 MG/ML IJ SOLN
25.0000 mg | Freq: Once | INTRAMUSCULAR | Status: AC
  Administered 2012-03-30: 25 mg via INTRAVENOUS

## 2012-03-30 MED ORDER — MEPERIDINE HCL 100 MG/ML IJ SOLN
INTRAMUSCULAR | Status: DC | PRN
  Administered 2012-03-30: 25 mg via INTRAVENOUS
  Administered 2012-03-30: 50 mg via INTRAVENOUS

## 2012-03-30 MED ORDER — BUTAMBEN-TETRACAINE-BENZOCAINE 2-2-14 % EX AERO
INHALATION_SPRAY | CUTANEOUS | Status: DC | PRN
  Administered 2012-03-30: 2 via TOPICAL

## 2012-03-30 MED ORDER — MIDAZOLAM HCL 5 MG/5ML IJ SOLN
INTRAMUSCULAR | Status: DC | PRN
  Administered 2012-03-30 (×2): 2 mg via INTRAVENOUS
  Administered 2012-03-30: 1 mg via INTRAVENOUS

## 2012-03-30 MED ORDER — PROMETHAZINE HCL 25 MG/ML IJ SOLN
INTRAMUSCULAR | Status: AC
  Filled 2012-03-30: qty 1

## 2012-03-30 MED ORDER — MIDAZOLAM HCL 5 MG/5ML IJ SOLN
INTRAMUSCULAR | Status: AC
  Filled 2012-03-30: qty 10

## 2012-03-30 NOTE — Op Note (Signed)
[facility] [address] [place], [place]  [address]  ENDOSCOPY PROCEDURE REPORT  PATIENT:  [name], [name]  MR#:  [id number] BIRTHDATE:  [birth year], 56 yrs. old  GENDER:  female  ENDOSCOPIST:  R. Roetta Sessions, MD Caleen Essex Referred by:          [facility] health Department  PROCEDURE DATE:  03/30/2012 PROCEDURE:  EGD with Elease Hashimoto dilation  INDICATIONS:   esophageal dysphagia  INFORMED CONSENT:   The risks, benefits, limitations, alternatives and imponderables have been discussed.  The potential for biopsy, esophogeal dilation, etc. have also been reviewed.  Questions have been answered.  All parties agreeable.  Please see the history and physical in the medical record for more information.  MEDICATIONS:      Versed 4 mg IV and Demerol 75 mg IV in divided dose. Cetacaine spray.  DESCRIPTION OF PROCEDURE:   The EG-[id number] ([id number]) endoscope was introduced through the mouth and advanced to the second portion of the duodenum without difficulty or limitations.  The mucosal surfaces were surveyed very carefully during advancement of the scope and upon withdrawal.  Retroflexion view of the proximal stomach and esophagogastric junction was performed.  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 Small hiatal hernia. Stomach empty. Normal                    gastric mucosa. Patent pylorus. Normal first and second portion of the duodenum.  THERAPEUTIC / DIAGNOSTIC MANEUVERS PERFORMED:  A 54 French Maloney dilator was passed to full insertion easily. A look back revealed no apparent complication related to passage of the dilator.  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 Small hiatal hernia.  RECOMMENDATIONS:   See colonoscopy report.  ______________________________ R. Roetta Sessions, MD Caleen Essex  CC:  n. eSIGNED:   R.  Roetta Sessions at 03/30/2012 10:26 AM  Tyrone Nine, [phone]

## 2012-03-30 NOTE — Interval H&P Note (Signed)
History and Physical Interval Note:  03/30/2012 9:55 AM  [name]  has presented today for surgery, with the diagnosis of hx of polyps, dysphagia  The various methods of treatment have been discussed with the patient and family. After consideration of risks, benefits and other options for treatment, the patient has consented to  Procedure(s) (LRB): COLONOSCOPY (N/A) ESOPHAGOGASTRODUODENOSCOPY (EGD) WITH ESOPHAGEAL DILATION (N/A) as a surgical intervention .  The patients' history has been reviewed, patient examined, no change in status, stable for surgery.  I have reviewed the patients' chart and labs.  Questions were answered to the patient's satisfaction.     [name]

## 2012-03-30 NOTE — H&P (View-Only) (Signed)
Primary Care Physician:  [facility] health Department  Primary Gastroenterologist:  Roetta Sessions, MD   Chief Complaint  Patient presents with  . Colonoscopy    HPI:  [name] is a 56 y.o. female here To schedule surveillance colonoscopy. Last colonoscopy was done in February of 2008. She had a tubular adenoma removed. She was recommended to have a followup colonoscopy in 3 years. She tells me she received the letter but did not have medical insurance at that time. She therefore did not pursue surveillance colonoscopy then. She recently established care at the [facility] Department.  She advised them that she was overdue for colonoscopy therefore presents today.  Nexium controls heartburn. Sometimes takes rolaids/tums. Now that she is on Medicaid she will have to switch to omeprazole. Some problems swallow rice/bread. Has had to induce vomiting at times because the bolus would not go down. No abdominal pain. No n/v. Changed diet, increased fiber. BM now softer stools, 1-2 per day. No blood in stool or melena.   Current Outpatient Prescriptions  Medication Sig Dispense Refill  . Albuterol Sulfate (PROVENTIL HFA IN) Inhale into the lungs as needed.      . ALPRAZolam (XANAX) 1 MG tablet Take 1 mg by mouth 2 (two) times daily.       . ARIPiprazole (ABILIFY) 5 MG tablet Take 5 mg by mouth daily. 1/2 tablet      . aspirin 81 MG tablet Take 81 mg by mouth daily.      Marland Kitchen esomeprazole (NEXIUM) 40 MG capsule Take 40 mg by mouth daily before breakfast.      . fish oil-omega-3 fatty acids 1000 MG capsule Take 2 g by mouth daily.      Marland Kitchen FLUoxetine (PROZAC) 40 MG capsule Take 40 mg by mouth daily.      . Fluticasone-Salmeterol (ADVAIR) 250-50 MCG/DOSE AEPB Inhale 1 puff into the lungs every 12 (twelve) hours.      Marland Kitchen glipiZIDE (GLUCOTROL) 5 MG tablet Take 5 mg by mouth daily.      Marland Kitchen lisinopril (PRINIVIL,ZESTRIL) 2.5 MG tablet Take 2.5 mg by mouth daily.      . metFORMIN  (GLUCOPHAGE) 1000 MG tablet Take 1,000 mg by mouth 2 (two) times daily with a meal.      . traZODone (DESYREL) 150 MG tablet Take 150 mg by mouth daily. 1/2 tablet        Allergies as of 03/08/2012 - Review Complete 03/08/2012  Allergen Reaction Noted  . Penicillins Anaphylaxis and Swelling 03/08/2012  . Bee venom  03/08/2012    Past Medical History  Diagnosis Date  . Diabetes mellitus   . COPD (chronic obstructive pulmonary disease) 2011  . GERD (gastroesophageal reflux disease)   . Hypertriglyceridemia     ?  Marland Kitchen Allergic rhinitis   . Breast cancer 2008    chemotherapy, XRT  . Reflux esophagitis 2008    egd  . Adenomatous colon polyp 01/2007  . Anxiety   . Bipolar disorder     [facility]    Past Surgical History  Procedure Date  . Esophagogastroduodenoscopy 02/14/07    distal esophageal erosions   . Colonoscopy 02/14/07    engorged external and internal hemorrhoids with recent bleeding, diminutive polyp in the cecum, tubular adenoma, normal TI  . Mastectomy partial / lumpectomy 2008  . Tubal ligation   . Ectopic pregnancy surgery   . Vaginal hysterectomy 10/2007    with vaginal vault suspension    Family History  Problem Relation Age of Onset  . Colon cancer Paternal Grandmother 53    deceased  . Liver disease Neg Hx     History   Social History  . Marital Status: Legally Separated    Spouse Name: N/A    Number of Children: 3  . Years of Education: N/A   Occupational History  . Not on file.   Social History Main Topics  . Smoking status: Former Smoker -- 0.5 packs/day    Types: Cigarettes  . Smokeless tobacco: Former Neurosurgeon    Quit date: 01/09/1996  . Alcohol Use: No  . Drug Use: No  . Sexually Active: Not on file   Other Topics Concern  . Not on file   Social History Narrative  . No narrative on file      ROS:  General: Negative for anorexia, weight loss, fever, chills, fatigue, weakness. Eyes: Negative for vision changes.  ENT: Negative for  hoarseness, nasal congestion. CV: Negative for chest pain, angina, palpitations, peripheral edema.  Respiratory: Negative for dyspnea at rest. Some baseline dyspnea on exertion with COPD. Denies cough, sputum, wheezing.  GI: See history of present illness. GU:  Negative for dysuria, hematuria, urinary incontinence, urinary frequency, nocturnal urination.  MS: Negative for joint pain, low back pain.  Derm: Negative for rash or itching.  Neuro: Negative for weakness, abnormal sensation, seizure, frequent headaches, memory loss, confusion.  Psych: positive for anxiety, depression, negative for suicidal ideation, hallucinations.  Endo: Negative for unusual weight change.  Heme: Negative for bruising or bleeding. Allergy: Negative for rash or hives.    Physical Examination:  BP 117/72  Pulse 84  Temp(Src) 97.5 F (36.4 C) (Temporal)  Ht 5\' 7"  (1.702 m)  Wt 237 lb 6.4 oz (107.684 kg)  BMI 37.18 kg/m2   General: Well-nourished, well-developed in no acute distress.  Head: Normocephalic, atraumatic.   Eyes: Conjunctiva pink, no icterus. Mouth: Oropharyngeal mucosa moist and pink , no lesions erythema or exudate. Neck: Supple without thyromegaly, masses, or lymphadenopathy.  Lungs: Clear to auscultation bilaterally.  Heart: Regular rate and rhythm, no murmurs rubs or gallops.  Abdomen: Bowel sounds are normal, nontender, nondistended, no hepatosplenomegaly or masses, no abdominal bruits or  hernia , no rebound or guarding.   Rectal: deferred to time of colonoscopy Extremities: No lower extremity edema. No clubbing or deformities.  Neuro: Alert and oriented x 4 , grossly normal neurologically.  Skin: Warm and dry, no rash or jaundice.   Psych: Alert and cooperative, normal mood and affect.

## 2012-03-30 NOTE — Discharge Instructions (Addendum)
Colonoscopy Discharge Instructions  Read the instructions outlined below and refer to this sheet in the next few weeks. These discharge instructions provide you with general information on caring for yourself after you leave the hospital. Your doctor may also give you specific instructions. While your treatment has been planned according to the most current medical practices available, unavoidable complications occasionally occur. If you have any problems or questions after discharge, call Dr. Gala Romney at [phone]. ACTIVITY  You may resume your regular activity, but move at a slower pace for the next 24 hours.   Take frequent rest periods for the next 24 hours.   Walking will help get rid of the air and reduce the bloated feeling in your belly (abdomen).   No driving for 24 hours (because of the medicine (anesthesia) used during the test).    Do not sign any important legal documents or operate any machinery for 24 hours (because of the anesthesia used during the test).  NUTRITION  Drink plenty of fluids.   You may resume your normal diet as instructed by your doctor.   Begin with a light meal and progress to your normal diet. Heavy or fried foods are harder to digest and may make you feel sick to your stomach (nauseated).   Avoid alcoholic beverages for 24 hours or as instructed.  MEDICATIONS  You may resume your normal medications unless your doctor tells you otherwise.  WHAT YOU CAN EXPECT TODAY  Some feelings of bloating in the abdomen.   Passage of more gas than usual.   Spotting of blood in your stool or on the toilet paper.  IF YOU HAD POLYPS REMOVED DURING THE COLONOSCOPY:  No aspirin products for 7 days or as instructed.   No alcohol for 7 days or as instructed.   Eat a soft diet for the next 24 hours.  FINDING OUT THE RESULTS OF YOUR TEST Not all test results are available during your visit. If your test results are not back during the visit, make an appointment  with your caregiver to find out the results. Do not assume everything is normal if you have not heard from your caregiver or the medical facility. It is important for you to follow up on all of your test results.  SEEK IMMEDIATE MEDICAL ATTENTION IF:  You have more than a spotting of blood in your stool.   Your belly is swollen (abdominal distention).   You are nauseated or vomiting.   You have a temperature over 101.  You have abdominal pain or discomfort that is severe or gets worse throughout the day. EGD Discharge instructions Please read the instructions outlined below and refer to this sheet in the next few weeks. These discharge instructions provide you with general information on caring for yourself after you leave the hospital. Your doctor may also give you specific instructions. While your treatment has been planned according to the most current medical practices available, unavoidable complications occasionally occur. If you have any problems or questions after discharge, please call your doctor. ACTIVITY You may resume your regular activity but move at a slower pace for the next 24 hours.  Take frequent rest periods for the next 24 hours.  Walking will help expel (get rid of) the air and reduce the bloated feeling in your abdomen.  No driving for 24 hours (because of the anesthesia (medicine) used during the test).  You may shower.  Do not sign any important legal documents or operate any machinery for 24  hours (because of the anesthesia used during the test).  NUTRITION Drink plenty of fluids.  You may resume your normal diet.  Begin with a light meal and progress to your normal diet.  Avoid alcoholic beverages for 24 hours or as instructed by your caregiver.  MEDICATIONS You may resume your normal medications unless your caregiver tells you otherwise.  WHAT YOU CAN EXPECT TODAY You may experience abdominal discomfort such as a feeling of fullness or "gas" pains.   FOLLOW-UP Your doctor will discuss the results of your test with you.  SEEK IMMEDIATE MEDICAL ATTENTION IF ANY OF THE FOLLOWING OCCUR: Excessive nausea (feeling sick to your stomach) and/or vomiting.  Severe abdominal pain and distention (swelling).  Trouble swallowing.  Temperature over 101 F (37.8 C).  Rectal bleeding or vomiting of blood.    Continue Nexium daily. Followup on pathology. Polyp information provided to the patient. Colon Polyps A polyp is extra tissue that grows inside your body. Colon polyps grow in the large intestine. The large intestine, also called the colon, is part of your digestive system. It is a long, hollow tube at the end of your digestive tract where your body makes and stores stool. Most polyps are not dangerous. They are benign. This means they are not cancerous. But over time, some types of polyps can turn into cancer. Polyps that are smaller than a pea are usually not harmful. But larger polyps could someday become or may already be cancerous. To be safe, doctors remove all polyps and test them.  WHO GETS POLYPS? Anyone can get polyps, but certain people are more likely than others. You may have a greater chance of getting polyps if: You are over 50.  You have had polyps before.  Someone in your family has had polyps.  Someone in your family has had cancer of the large intestine.  Find out if someone in your family has had polyps. You may also be more likely to get polyps if you:  Eat a lot of fatty foods.  Smoke.  Drink alcohol.  Do not exercise.  Eat too much.  SYMPTOMS  Most small polyps do not cause symptoms. People often do not know they have one until their caregiver finds it during a regular checkup or while testing them for something else. Some people do have symptoms like these: Bleeding from the anus. You might notice blood on your underwear or on toilet paper after you have had a bowel movement.  Constipation or diarrhea that lasts more  than a week.  Blood in the stool. Blood can make stool look black or it can show up as red streaks in the stool.  If you have any of these symptoms, see your caregiver. HOW DOES THE DOCTOR TEST FOR POLYPS? The doctor can use four tests to check for polyps: Digital rectal exam. The caregiver wears gloves and checks your rectum (the last part of the large intestine) to see if it feels normal. This test would find polyps only in the rectum. Your caregiver may need to do one of the other tests listed below to find polyps higher up in the intestine.  Barium enema. The caregiver puts a liquid called barium into your rectum before taking x-rays of your large intestine. Barium makes your intestine look white in the pictures. Polyps are dark, so they are easy to see.  Sigmoidoscopy. With this test, the caregiver can see inside your large intestine. A thin flexible tube is placed into your rectum. The  device is called a sigmoidoscope, which has a light and a tiny video camera in it. The caregiver uses the sigmoidoscope to look at the last third of your large intestine.  Colonoscopy. This test is like sigmoidoscopy, but the caregiver looks at all of the large intestine. It usually requires sedation. This is the most common method for finding and removing polyps.  TREATMENT  The caregiver will remove the polyp during sigmoidoscopy or colonoscopy. The polyp is then tested for cancer.  If you have had polyps, your caregiver may want you to get tested regularly in the future.  PREVENTION  There is not one sure way to prevent polyps. You might be able to lower your risk of getting them if you: Eat more fruits and vegetables and less fatty food.  Do not smoke.  Avoid alcohol.  Exercise every day.  Lose weight if you are overweight.  Eating more calcium and folate can also lower your risk of getting polyps. Some foods that are rich in calcium are milk, cheese, and broccoli. Some foods that are rich in folate are  chickpeas, kidney beans, and spinach.  Aspirin might help prevent polyps. Studies are under way.  Document Released: 09/02/2004 Document Revised: 11/26/2011 Document Reviewed: 02/08/2008 [facility] Patient Information 2012 [place], [place].

## 2012-03-30 NOTE — Op Note (Signed)
[facility] [address] [place], [place]  [address]  COLONOSCOPY PROCEDURE REPORT  PATIENT:  [name], [name]  MR#:  [id number] BIRTHDATE:  22-Feb-1956, 56 yrs. old  GENDER:  female ENDOSCOPIST:  R. Roetta Sessions, MD FACP [facility] REF. BY:          [facility] PROCEDURE DATE:  03/30/2012 PROCEDURE:  Colonoscopy with biopsy and snare polypectomy  INDICATIONS:  History of colonic polyps; overdue for surveillance examination.  INFORMED CONSENT:  The risks, benefits, alternatives and imponderables including but not limited to bleeding, perforation as well as the possibility of a missed lesion have been reviewed. The potential for biopsy, lesion removal, etc. have also been discussed.  Questions have been answered.  All parties agreeable. Please see the history and physical in the medical record for more information.  MEDICATIONS:  Versed 5 mg IV and Demerol 75 mg IV in divided doses.  DESCRIPTION OF PROCEDURE:  After a digital rectal exam was performed, the EG-[id number] ([id number]) and EC-[id number] ([id number]) colonoscope was advanced from the anus through the rectum and colon to the area of the cecum, ileocecal valve and appendiceal orifice.  The cecum was deeply intubated.  These structures were well-seen and photographed for the record.  From the level of the cecum and ileocecal valve, the scope was slowly and cautiously withdrawn.  The mucosal surfaces were carefully surveyed utilizing scope tip deflection to facilitate fold flattening as needed.  The scope was pulled down into the rectum where a thorough examination including retroflexion was performed. <<PROCEDUREIMAGES>>  FINDINGS: Adequate preparation. 7 mm flat polyp just inside the anal verge; otherwise, normal rectum. Melanosis coli single diminutive polyp in the base of the cecum; otherwise, normal colonic mucosa.  THERAPEUTIC / DIAGNOSTIC MANEUVERS PERFORMED: The rectal polyp was removed with  hot cautery technique. The polyp in the cecum was removed with cold biopsy forcep technique.  COMPLICATIONS:  None  CECAL WITHDRAWAL TIME:  14  minutes  IMPRESSION:   Rectal and colonic polyps-removed as described above. Melanosis coli.  RECOMMENDATIONS:  Follow up on pathology. See EGD report.  ______________________________ R. Roetta Sessions, MD Caleen Essex  CC:  n. eSIGNED:   R. Roetta Sessions at 03/30/2012 10:48 AM  Tyrone Nine, [phone]

## 2012-04-01 ENCOUNTER — Encounter (HOSPITAL_COMMUNITY): Payer: Self-pay | Admitting: Internal Medicine

## 2012-04-04 ENCOUNTER — Encounter: Payer: Self-pay | Admitting: Internal Medicine

## 2012-08-03 ENCOUNTER — Other Ambulatory Visit (HOSPITAL_COMMUNITY): Payer: Self-pay | Admitting: Nurse Practitioner

## 2012-08-03 DIAGNOSIS — Z9889 Other specified postprocedural states: Secondary | ICD-10-CM

## 2012-08-10 ENCOUNTER — Ambulatory Visit (HOSPITAL_COMMUNITY)
Admission: RE | Admit: 2012-08-10 | Discharge: 2012-08-10 | Disposition: A | Discharge status: Home / Self Care | Payer: Medicaid Other | Source: Ambulatory Visit | Attending: Nurse Practitioner | Admitting: Nurse Practitioner

## 2012-08-10 DIAGNOSIS — Z9889 Other specified postprocedural states: Secondary | ICD-10-CM

## 2012-08-10 DIAGNOSIS — Z853 Personal history of malignant neoplasm of breast: Secondary | ICD-10-CM | POA: Insufficient documentation

## 2013-07-10 ENCOUNTER — Other Ambulatory Visit (HOSPITAL_COMMUNITY): Payer: Self-pay | Admitting: *Deleted

## 2013-07-10 DIAGNOSIS — Z853 Personal history of malignant neoplasm of breast: Secondary | ICD-10-CM

## 2013-08-16 ENCOUNTER — Encounter (HOSPITAL_COMMUNITY): Payer: Medicaid Other

## 2013-09-06 ENCOUNTER — Ambulatory Visit (HOSPITAL_COMMUNITY)
Admission: RE | Admit: 2013-09-06 | Discharge: 2013-09-06 | Disposition: A | Discharge status: Home / Self Care | Payer: Medicaid Other | Source: Ambulatory Visit | Attending: *Deleted | Admitting: *Deleted

## 2013-09-06 DIAGNOSIS — Z853 Personal history of malignant neoplasm of breast: Secondary | ICD-10-CM

## 2013-10-05 ENCOUNTER — Other Ambulatory Visit (HOSPITAL_COMMUNITY): Payer: Self-pay | Admitting: *Deleted

## 2013-10-05 DIAGNOSIS — D4989 Neoplasm of unspecified behavior of other specified sites: Secondary | ICD-10-CM

## 2013-10-09 ENCOUNTER — Ambulatory Visit (HOSPITAL_COMMUNITY)
Admission: RE | Admit: 2013-10-09 | Discharge: 2013-10-09 | Disposition: A | Discharge status: Home / Self Care | Payer: Medicaid Other | Source: Ambulatory Visit | Attending: *Deleted | Admitting: *Deleted

## 2013-10-09 DIAGNOSIS — R220 Localized swelling, mass and lump, head: Secondary | ICD-10-CM | POA: Insufficient documentation

## 2013-10-09 DIAGNOSIS — D4989 Neoplasm of unspecified behavior of other specified sites: Secondary | ICD-10-CM

## 2014-10-15 ENCOUNTER — Other Ambulatory Visit (HOSPITAL_COMMUNITY): Payer: Self-pay | Admitting: *Deleted

## 2014-10-15 ENCOUNTER — Telehealth: Payer: Self-pay | Admitting: Family Medicine

## 2014-10-15 DIAGNOSIS — Z1231 Encounter for screening mammogram for malignant neoplasm of breast: Secondary | ICD-10-CM

## 2014-10-15 NOTE — Telephone Encounter (Signed)
Apt made, meds reviewed

## 2014-10-29 ENCOUNTER — Ambulatory Visit (HOSPITAL_COMMUNITY)
Admission: RE | Admit: 2014-10-29 | Discharge: 2014-10-29 | Disposition: A | Discharge status: Home / Self Care | Payer: Medicaid Other | Source: Ambulatory Visit | Attending: *Deleted | Admitting: *Deleted

## 2014-10-29 DIAGNOSIS — Z1231 Encounter for screening mammogram for malignant neoplasm of breast: Secondary | ICD-10-CM | POA: Insufficient documentation

## 2014-11-28 ENCOUNTER — Encounter: Payer: Self-pay | Admitting: *Deleted

## 2015-01-16 ENCOUNTER — Ambulatory Visit (INDEPENDENT_AMBULATORY_CARE_PROVIDER_SITE_OTHER): Payer: Medicaid Other | Admitting: Family

## 2015-01-16 ENCOUNTER — Encounter (INDEPENDENT_AMBULATORY_CARE_PROVIDER_SITE_OTHER): Payer: Self-pay

## 2015-01-16 ENCOUNTER — Encounter: Payer: Self-pay | Admitting: Family

## 2015-01-16 VITALS — BP 133/79 | HR 87 | Temp 97.5°F | Ht 68.0 in | Wt 244.0 lb

## 2015-01-16 DIAGNOSIS — G4700 Insomnia, unspecified: Secondary | ICD-10-CM

## 2015-01-16 DIAGNOSIS — R599 Enlarged lymph nodes, unspecified: Secondary | ICD-10-CM

## 2015-01-16 DIAGNOSIS — K1370 Unspecified lesions of oral mucosa: Secondary | ICD-10-CM

## 2015-01-16 DIAGNOSIS — E119 Type 2 diabetes mellitus without complications: Secondary | ICD-10-CM

## 2015-01-16 DIAGNOSIS — J449 Chronic obstructive pulmonary disease, unspecified: Secondary | ICD-10-CM

## 2015-01-16 DIAGNOSIS — F411 Generalized anxiety disorder: Secondary | ICD-10-CM

## 2015-01-16 DIAGNOSIS — Z1321 Encounter for screening for nutritional disorder: Secondary | ICD-10-CM

## 2015-01-16 DIAGNOSIS — Z23 Encounter for immunization: Secondary | ICD-10-CM

## 2015-01-16 DIAGNOSIS — Z0000 Encounter for general adult medical examination without abnormal findings: Secondary | ICD-10-CM

## 2015-01-16 DIAGNOSIS — R591 Generalized enlarged lymph nodes: Secondary | ICD-10-CM

## 2015-01-16 DIAGNOSIS — K219 Gastro-esophageal reflux disease without esophagitis: Secondary | ICD-10-CM

## 2015-01-16 LAB — POCT UA - MICROALBUMIN: Microalbumin Ur, POC: NEGATIVE mg/L

## 2015-01-16 LAB — POCT GLYCOSYLATED HEMOGLOBIN (HGB A1C): HEMOGLOBIN A1C: 6.4

## 2015-01-16 MED ORDER — TIOTROPIUM BROMIDE MONOHYDRATE 2.5 MCG/ACT IN AERS: 2.5000 ug | INHALATION_SPRAY | Freq: Two times a day (BID) | RESPIRATORY_TRACT | Status: DC

## 2015-01-16 NOTE — Progress Notes (Signed)
Subjective:    Patient ID: [name], female    DOB: 1956-07-23, 59 y.o.   MRN: [id number]  Diabetes She presents for her follow-up diabetic visit. She has type 2 diabetes mellitus. Her disease course has been stable. Hypoglycemia symptoms include confusion and dizziness. Pertinent negatives for hypoglycemia include no headaches. Associated symptoms include foot paresthesias. Pertinent negatives for diabetes include no blurred vision, no foot ulcerations and no visual change. There are no hypoglycemic complications. Pertinent negatives for hypoglycemia complications include no blackouts. Symptoms are stable. Diabetic complications include peripheral neuropathy. Pertinent negatives for diabetic complications include no CVA, heart disease or nephropathy. Risk factors for coronary artery disease include diabetes mellitus, obesity and post-menopausal. Current diabetic treatment includes oral agent (dual therapy). She is compliant with treatment all of the time. She is following a generally healthy diet. Her breakfast blood glucose range is generally 130-140 mg/dl. An ACE inhibitor/angiotensin II receptor blocker is being taken. Eye exam is current.  Gastrophageal Reflux She reports no belching, no choking, no coughing, no heartburn, no sore throat or no tooth decay. This is a chronic problem. The current episode started more than 1 year ago. The problem occurs rarely. The symptoms are aggravated by certain foods and lying down. Pertinent negatives include no muscle weakness. She has tried a PPI for the symptoms. The treatment provided significant relief.  COPD Pt currently taking advair (Usually only one dose a day) and albuterol inhaler as needed. Pt states she is SOB and gets very SOB with any activity.   Pt see's a provider for GAD, depression, and insomnia. Pt taking xanax, prozac, and trazodone. Pt states these are managed well at this time.     Review of Systems  HENT: Negative for sore  throat.   Eyes: Negative for blurred vision.  Respiratory: Negative for cough and choking.   Gastrointestinal: Negative for heartburn.  Musculoskeletal: Negative for muscle weakness.  Neurological: Positive for dizziness. Negative for headaches.  Psychiatric/Behavioral: Positive for confusion.       Objective:   Physical Exam  Constitutional: She is oriented to person, place, and time. She appears well-developed and well-nourished. No distress.  HENT:  Head: Normocephalic and atraumatic.  Right Ear: External ear normal.  Left Ear: External ear normal.  Nose: Nose normal.  Mouth/Throat: Oropharynx is clear and moist. Oral lesions present.  Eyes: Pupils are equal, round, and reactive to light.  Neck: Normal range of motion. Neck supple. No thyromegaly present.  Pt has left cervical nodule- Pt has been told it was a cyst in past   Cardiovascular: Normal rate, regular rhythm, normal heart sounds and intact distal pulses.   No murmur heard. Pulmonary/Chest: Effort normal. No respiratory distress. She has no wheezes.  Diminished breath sounds bilaterally  Abdominal: Soft. Bowel sounds are normal. She exhibits no distension. There is no tenderness.  Musculoskeletal: Normal range of motion. She exhibits no edema or tenderness.  Lymphadenopathy:    She has cervical adenopathy.  Neurological: She is alert and oriented to person, place, and time. She has normal reflexes. No cranial nerve deficit.  Skin: Skin is warm and dry.  Psychiatric: She has a normal mood and affect. Her behavior is normal. Judgment and thought content normal.  Vitals reviewed.     BP 133/79 mmHg  Pulse 87  Temp(Src) 97.5 F (36.4 C) (Oral)  Ht _0  (1.727 m)  Wt 244 lb (110.678 kg)  BMI 37.11 kg/m2     Assessment & Plan:  1.  Gastroesophageal reflux disease, esophagitis presence not specified - CMP14+EGFR  2. GAD (generalized anxiety disorder) - CMP14+EGFR  3. Chronic obstructive pulmonary disease,  unspecified COPD, unspecified chronic bronchitis type - CMP14+EGFR - Pneumococcal conjugate vaccine 13-valent IM - Tiotropium Bromide Monohydrate (SPIRIVA RESPIMAT) 2.5 MCG/ACT AERS; Inhale 2.5 mcg into the lungs 2 (two) times daily.  Dispense: 4 g; Refill: 11  4. Type 2 diabetes mellitus without complication - RZN35+APOL - POCT glycosylated hemoglobin (Hb A1C) - POCT UA - Microalbumin  5. Insomnia - CMP14+EGFR  6. Encounter for vitamin deficiency screening - Vit D  25 hydroxy (rtn osteoporosis monitoring)  7. Annual physical exam - Lipid panel - Thyroid Panel With TSH  8. Enlarged lymph node - Ambulatory referral to ENT  9. Oral lesion - Ambulatory referral to ENT  10. Need for diphtheria-tetanus-pertussis (Tdap) vaccine, adult/adolescent - Tdap vaccine greater than or equal to 7yo IM   Continue all meds Labs pending Health Maintenance reviewed Diet and exercise encouraged RTO 3 months   Evelina Dun, FNP

## 2015-01-16 NOTE — Patient Instructions (Addendum)
Health Maintenance Adopting a healthy lifestyle and getting preventive care can go a long way to promote health and wellness. Talk with your health care provider about what schedule of regular examinations is right for you. This is a good chance for you to check in with your provider about disease prevention and staying healthy. In between checkups, there are plenty of things you can do on your own. Experts have done a lot of research about which lifestyle changes and preventive measures are most likely to keep you healthy. Ask your health care provider for more information. WEIGHT AND DIET  Eat a healthy diet  Be sure to include plenty of vegetables, fruits, low-fat dairy products, and lean protein.  Do not eat a lot of foods high in solid fats, added sugars, or salt.  Get regular exercise. This is one of the most important things you can do for your health.  Most adults should exercise for at least 150 minutes each week. The exercise should increase your heart rate and make you sweat (moderate-intensity exercise).  Most adults should also do strengthening exercises at least twice a week. This is in addition to the moderate-intensity exercise.  Maintain a healthy weight  Body mass index (BMI) is a measurement that can be used to identify possible weight problems. It estimates body fat based on height and weight. Your health care provider can help determine your BMI and help you achieve or maintain a healthy weight.  For females 25 years of age and older:   A BMI below 18.5 is considered underweight.  A BMI of 18.5 to 24.9 is normal.  A BMI of 25 to 29.9 is considered overweight.  A BMI of 30 and above is considered obese.  Watch levels of cholesterol and blood lipids  You should start having your blood tested for lipids and cholesterol at 60 years of age, then have this test every 5 years.  You may need to have your cholesterol levels checked more often if:  Your lipid or  cholesterol levels are high.  You are older than 60 years of age.  You are at high risk for heart disease.  CANCER SCREENING   Lung Cancer  Lung cancer screening is recommended for adults 97-92 years old who are at high risk for lung cancer because of a history of smoking.  A yearly low-dose CT scan of the lungs is recommended for people who:  Currently smoke.  Have quit within the past 15 years.  Have at least a 30-pack-year history of smoking. A pack year is smoking an average of one pack of cigarettes a day for 1 year.  Yearly screening should continue until it has been 15 years since you quit.  Yearly screening should stop if you develop a health problem that would prevent you from having lung cancer treatment.  Breast Cancer  Practice breast self-awareness. This means understanding how your breasts normally appear and feel.  It also means doing regular breast self-exams. Let your health care provider know about any changes, no matter how small.  If you are in your 20s or 30s, you should have a clinical breast exam (CBE) by a health care provider every 1-3 years as part of a regular health exam.  If you are 76 or older, have a CBE every year. Also consider having a breast X-ray (mammogram) every year.  If you have a family history of breast cancer, talk to your health care provider about genetic screening.  If you are  at high risk for breast cancer, talk to your health care provider about having an MRI and a mammogram every year.  Breast cancer gene (BRCA) assessment is recommended for women who have family members with BRCA-related cancers. BRCA-related cancers include:  Breast.  Ovarian.  Tubal.  Peritoneal cancers.  Results of the assessment will determine the need for genetic counseling and BRCA1 and BRCA2 testing. Cervical Cancer Routine pelvic examinations to screen for cervical cancer are no longer recommended for nonpregnant women who are considered low  risk for cancer of the pelvic organs (ovaries, uterus, and vagina) and who do not have symptoms. A pelvic examination may be necessary if you have symptoms including those associated with pelvic infections. Ask your health care provider if a screening pelvic exam is right for you.  1. The Pap test is the screening test for cervical cancer for women who are considered at risk. 1. If you had a hysterectomy for a problem that was not cancer or a condition that could lead to cancer, then you no longer need Pap tests. 2. If you are older than 65 years, and you have had normal Pap tests for the past 10 years, you no longer need to have Pap tests. 3. If you have had past treatment for cervical cancer or a condition that could lead to cancer, you need Pap tests and screening for cancer for at least 20 years after your treatment. 4. If you no longer get a Pap test, assess your risk factors if they change (such as having a new sexual partner). This can affect whether you should start being screened again. 5. Some women have medical problems that increase their chance of getting cervical cancer. If this is the case for you, your health care provider may recommend more frequent screening and Pap tests.  The human papillomavirus (HPV) test is another test that may be used for cervical cancer screening. The HPV test looks for the virus that can cause cell changes in the cervix. The cells collected during the Pap test can be tested for HPV.  The HPV test can be used to screen women 44 years of age and older. Getting tested for HPV can extend the interval between normal Pap tests from three to five years.  An HPV test also should be used to screen women of any age who have unclear Pap test results.  After 60 years of age, women should have HPV testing as often as Pap tests.  Colorectal Cancer  This type of cancer can be detected and often prevented.  Routine colorectal cancer screening usually begins at 60 years  of age and continues through 60 years of age.  Your health care provider may recommend screening at an earlier age if you have risk factors for colon cancer.  Your health care provider may also recommend using home test kits to check for hidden blood in the stool.  A small camera at the end of a tube can be used to examine your colon directly (sigmoidoscopy or colonoscopy). This is done to check for the earliest forms of colorectal cancer.  Routine screening usually begins at age 33.  Direct examination of the colon should be repeated every 5-10 years through 60 years of age. However, you may need to be screened more often if early forms of precancerous polyps or small growths are found. Skin Cancer  Check your skin from head to toe regularly.  Tell your health care provider about any new moles or changes in  moles, especially if there is a change in a mole's shape or color.  Also tell your health care provider if you have a mole that is larger than the size of a pencil eraser.  Always use sunscreen. Apply sunscreen liberally and repeatedly throughout the day.  Protect yourself by wearing long sleeves, pants, a wide-brimmed hat, and sunglasses whenever you are outside. HEART DISEASE, DIABETES, AND HIGH BLOOD PRESSURE   Have your blood pressure checked at least every 1-2 years. High blood pressure causes heart disease and increases the risk of stroke.  If you are between 75 years and 42 years old, ask your health care provider if you should take aspirin to prevent strokes.  Have regular diabetes screenings. This involves taking a blood sample to check your fasting blood sugar level.  If you are at a normal weight and have a low risk for diabetes, have this test once every three years after 60 years of age.  If you are overweight and have a high risk for diabetes, consider being tested at a younger age or more often. PREVENTING INFECTION  Hepatitis B  If you have a higher risk for  hepatitis B, you should be screened for this virus. You are considered at high risk for hepatitis B if:  You were born in a country where hepatitis B is common. Ask your health care provider which countries are considered high risk.  Your parents were born in a high-risk country, and you have not been immunized against hepatitis B (hepatitis B vaccine).  You have HIV or AIDS.  You use needles to inject street drugs.  You live with someone who has hepatitis B.  You have had sex with someone who has hepatitis B.  You get hemodialysis treatment.  You take certain medicines for conditions, including cancer, organ transplantation, and autoimmune conditions. Hepatitis C  Blood testing is recommended for:  Everyone born from [birth year] through 1966.  Anyone with known risk factors for hepatitis C. Sexually transmitted infections (STIs)  You should be screened for sexually transmitted infections (STIs) including gonorrhea and chlamydia if:  You are sexually active and are younger than 60 years of age.  You are older than 60 years of age and your health care provider tells you that you are at risk for this type of infection.  Your sexual activity has changed since you were last screened and you are at an increased risk for chlamydia or gonorrhea. Ask your health care provider if you are at risk.  If you do not have HIV, but are at risk, it may be recommended that you take a prescription medicine daily to prevent HIV infection. This is called pre-exposure prophylaxis (PrEP). You are considered at risk if:  You are sexually active and do not regularly use condoms or know the HIV status of your partner(s).  You take drugs by injection.  You are sexually active with a partner who has HIV. Talk with your health care provider about whether you are at high risk of being infected with HIV. If you choose to begin PrEP, you should first be tested for HIV. You should then be tested every 3 months for  as long as you are taking PrEP.  PREGNANCY   If you are premenopausal and you may become pregnant, ask your health care provider about preconception counseling.  If you may become pregnant, take 400 to 800 micrograms (mcg) of folic acid every day.  If you want to prevent pregnancy, talk to your  health care provider about birth control (contraception). OSTEOPOROSIS AND MENOPAUSE   Osteoporosis is a disease in which the bones lose minerals and strength with aging. This can result in serious bone fractures. Your risk for osteoporosis can be identified using a bone density scan.  If you are 37 years of age or older, or if you are at risk for osteoporosis and fractures, ask your health care provider if you should be screened.  Ask your health care provider whether you should take a calcium or vitamin D supplement to lower your risk for osteoporosis.  Menopause may have certain physical symptoms and risks.  Hormone replacement therapy may reduce some of these symptoms and risks. Talk to your health care provider about whether hormone replacement therapy is right for you.  HOME CARE INSTRUCTIONS   Schedule regular health, dental, and eye exams.  Stay current with your immunizations.   Do not use any tobacco products including cigarettes, chewing tobacco, or electronic cigarettes.  If you are pregnant, do not drink alcohol.  If you are breastfeeding, limit how much and how often you drink alcohol.  Limit alcohol intake to no more than 1 drink per day for nonpregnant women. One drink equals 12 ounces of beer, 5 ounces of wine, or 1 ounces of hard liquor.  Do not use street drugs.  Do not share needles.  Ask your health care provider for help if you need support or information about quitting drugs.  Tell your health care provider if you often feel depressed.  Tell your health care provider if you have ever been abused or do not feel safe at home. Document Released: 06/22/2011  Document Revised: 04/23/2014 Document Reviewed: 11/08/2013 [facility] Patient Information 2015 [place], [place]. This information is not intended to replace advice given to you by your health care provider. Make sure you discuss any questions you have with your health care provider. Chronic Obstructive Pulmonary Disease Chronic obstructive pulmonary disease (COPD) is a common lung condition in which airflow from the lungs is limited. COPD is a general term that can be used to describe many different lung problems that limit airflow, including both chronic bronchitis and emphysema. If you have COPD, your lung function will probably never return to normal, but there are measures you can take to improve lung function and make yourself feel better.  CAUSES   Smoking (common).   Exposure to secondhand smoke.   Genetic problems.  Chronic inflammatory lung diseases or recurrent infections. SYMPTOMS   Shortness of breath, especially with physical activity.   Deep, persistent (chronic) cough with a large amount of thick mucus.   Wheezing.   Rapid breaths (tachypnea).   Gray or bluish discoloration (cyanosis) of the skin, especially in fingers, toes, or lips.   Fatigue.   Weight loss.   Frequent infections or episodes when breathing symptoms become much worse (exacerbations).   Chest tightness. DIAGNOSIS  Your health care provider will take a medical history and perform a physical examination to make the initial diagnosis. Additional tests for COPD may include:   Lung (pulmonary) function tests.  Chest X-ray.  CT scan.  Blood tests. TREATMENT  Treatment available to help you feel better when you have COPD includes:   Inhaler and nebulizer medicines. These help manage the symptoms of COPD and make your breathing more comfortable.  Supplemental oxygen. Supplemental oxygen is only helpful if you have a low oxygen level in your blood.   Exercise and physical activity.  These are beneficial for nearly  all people with COPD. Some people may also benefit from a pulmonary rehabilitation program. HOME CARE INSTRUCTIONS   Take all medicines (inhaled or pills) as directed by your health care provider.  Avoid over-the-counter medicines or cough syrups that dry up your airway (such as antihistamines) and slow down the elimination of secretions unless instructed otherwise by your health care provider.   If you are a smoker, the most important thing that you can do is stop smoking. Continuing to smoke will cause further lung damage and breathing trouble. Ask your health care provider for help with quitting smoking. He or she can direct you to community resources or hospitals that provide support.  Avoid exposure to irritants such as smoke, chemicals, and fumes that aggravate your breathing.  Use oxygen therapy and pulmonary rehabilitation if directed by your health care provider. If you require home oxygen therapy, ask your health care provider whether you should purchase a pulse oximeter to measure your oxygen level at home.   Avoid contact with individuals who have a contagious illness.  Avoid extreme temperature and humidity changes.  Eat healthy foods. Eating smaller, more frequent meals and resting before meals may help you maintain your strength.  Stay active, but balance activity with periods of rest. Exercise and physical activity will help you maintain your ability to do things you want to do.  Preventing infection and hospitalization is very important when you have COPD. Make sure to receive all the vaccines your health care provider recommends, especially the pneumococcal and influenza vaccines. Ask your health care provider whether you need a pneumonia vaccine.  Learn and use relaxation techniques to manage stress.  Learn and use controlled breathing techniques as directed by your health care provider. Controlled breathing techniques include:   Pursed  lip breathing. Start by breathing in (inhaling) through your nose for 1 second. Then, purse your lips as if you were going to whistle and breathe out (exhale) through the pursed lips for 2 seconds.   Diaphragmatic breathing. Start by putting one hand on your abdomen just above your waist. Inhale slowly through your nose. The hand on your abdomen should move out. Then purse your lips and exhale slowly. You should be able to feel the hand on your abdomen moving in as you exhale.   Learn and use controlled coughing to clear mucus from your lungs. Controlled coughing is a series of short, progressive coughs. The steps of controlled coughing are:  2. Lean your head slightly forward.  3. Breathe in deeply using diaphragmatic breathing.  4. Try to hold your breath for 3 seconds.  5. Keep your mouth slightly open while coughing twice.  6. Spit any mucus out into a tissue.  7. Rest and repeat the steps once or twice as needed. SEEK MEDICAL CARE IF:   You are coughing up more mucus than usual.   There is a change in the color or thickness of your mucus.   Your breathing is more labored than usual.   Your breathing is faster than usual.  SEEK IMMEDIATE MEDICAL CARE IF:   You have shortness of breath while you are resting.   You have shortness of breath that prevents you from:  Being able to talk.   Performing your usual physical activities.   You have chest pain lasting longer than 5 minutes.   Your skin color is more cyanotic than usual.  You measure low oxygen saturations for longer than 5 minutes with a pulse oximeter. MAKE SURE YOU:  Understand these instructions.  Will watch your condition.  Will get help right away if you are not doing well or get worse. Document Released: 09/16/2005 Document Revised: 04/23/2014 Document Reviewed: 08/03/2013 [facility] Patient Information 2015 [place], [place]. This information is not intended to replace advice given to you by your  health care provider. Make sure you discuss any questions you have with your health care provider.

## 2015-01-17 LAB — LIPID PANEL
CHOL/HDL RATIO: 3.6 ratio (ref 0.0–4.4)
Cholesterol, Total: 185 mg/dL (ref 100–199)
HDL: 51 mg/dL (ref >39)
LDL CALC: 112 mg/dL — AB (ref 0–99)
Triglycerides: 109 mg/dL (ref 0–149)
VLDL Cholesterol Cal: 22 mg/dL (ref 5–40)

## 2015-01-17 LAB — THYROID PANEL WITH TSH
Free Thyroxine Index: 2.8 (ref 1.2–4.9)
T3 Uptake Ratio: 26 % (ref 24–39)
T4 TOTAL: 10.6 ug/dL (ref 4.5–12.0)
TSH: 1.32 u[IU]/mL (ref 0.450–4.500)

## 2015-01-17 LAB — CMP14+EGFR
ALT: 13 IU/L (ref 0–32)
AST: 12 IU/L (ref 0–40)
Albumin/Globulin Ratio: 1.9 (ref 1.1–2.5)
Albumin: 4.2 g/dL (ref 3.5–5.5)
Alkaline Phosphatase: 63 IU/L (ref 39–117)
BILIRUBIN TOTAL: 0.2 mg/dL (ref 0.0–1.2)
BUN/Creatinine Ratio: 17 (ref 9–23)
BUN: 17 mg/dL (ref 6–24)
CHLORIDE: 99 mmol/L (ref 97–108)
CO2: 22 mmol/L (ref 18–29)
CREATININE: 1.03 mg/dL — AB (ref 0.57–1.00)
Calcium: 9.3 mg/dL (ref 8.7–10.2)
GFR, EST AFRICAN AMERICAN: 69 mL/min/{1.73_m2} (ref >59)
GFR, EST NON AFRICAN AMERICAN: 60 mL/min/{1.73_m2} (ref >59)
GLOBULIN, TOTAL: 2.2 g/dL (ref 1.5–4.5)
Glucose: 146 mg/dL — ABNORMAL HIGH (ref 65–99)
POTASSIUM: 4.5 mmol/L (ref 3.5–5.2)
Sodium: 137 mmol/L (ref 134–144)
TOTAL PROTEIN: 6.4 g/dL (ref 6.0–8.5)

## 2015-01-17 LAB — VITAMIN D 25 HYDROXY (VIT D DEFICIENCY, FRACTURES): VIT D 25 HYDROXY: 28.3 ng/mL — AB (ref 30.0–100.0)

## 2015-01-18 ENCOUNTER — Other Ambulatory Visit: Payer: Self-pay | Admitting: Family

## 2015-01-18 DIAGNOSIS — E785 Hyperlipidemia, unspecified: Secondary | ICD-10-CM

## 2015-01-18 DIAGNOSIS — E559 Vitamin D deficiency, unspecified: Secondary | ICD-10-CM | POA: Insufficient documentation

## 2015-01-18 DIAGNOSIS — E1169 Type 2 diabetes mellitus with other specified complication: Secondary | ICD-10-CM | POA: Insufficient documentation

## 2015-01-18 MED ORDER — PRAVASTATIN SODIUM 20 MG PO TABS: 20.0000 mg | ORAL_TABLET | Freq: Every day | ORAL | Status: DC

## 2015-01-18 MED ORDER — VITAMIN D (ERGOCALCIFEROL) 1.25 MG (50000 UNIT) PO CAPS: 50000.0000 [IU] | ORAL_CAPSULE | ORAL | Status: DC

## 2015-01-21 ENCOUNTER — Ambulatory Visit (INDEPENDENT_AMBULATORY_CARE_PROVIDER_SITE_OTHER): Payer: Medicaid Other | Admitting: Pharmacist

## 2015-01-21 ENCOUNTER — Encounter: Payer: Self-pay | Admitting: Pharmacist

## 2015-01-21 VITALS — BP 134/70 | HR 78 | Ht 68.0 in | Wt 244.0 lb

## 2015-01-21 DIAGNOSIS — E119 Type 2 diabetes mellitus without complications: Secondary | ICD-10-CM

## 2015-01-21 DIAGNOSIS — Z79899 Other long term (current) drug therapy: Secondary | ICD-10-CM

## 2015-01-21 DIAGNOSIS — F411 Generalized anxiety disorder: Secondary | ICD-10-CM

## 2015-01-21 MED ORDER — CANAGLIFLOZIN 100 MG PO TABS: 100.0000 mg | ORAL_TABLET | Freq: Every day | ORAL | Status: DC

## 2015-01-21 NOTE — Progress Notes (Signed)
Subjective:      [name] is a 59 y.o. female who presents for an initial visit for evaluation of Type 2 diabetes mellitus and diabetes education. The initial diagnosis of diabetes was made around 1998.    Her clinical course has been stable. Medication review with Anntoinette suggested compliance all of the time. Associated symptoms of hyperglycemia have been none.  Associated symptoms of hypoglycemia have been headache, jitteriness and sweating. Has had 2 hypoglycemic events within the last 30 days.  She is currently taking glipizide 5mg  1 tablet bid and metformin 1000mg  bid.   Areas of Diabetes care of most concern to patient:  Weight / weight loss  Nutrition / limiting CHO - feels that she need refresher  Anxiety / stress / changes in antianxiety medications - patient sees psychiatrist at [facility] but feels that she and the provider are not communicating well.  Hypoglycemic events  Wants to be healthier so she can play with grandchildren   Compliance with blood glucose monitoring: fair.  The patient does perform independently. Exercise: rarely  Meal panning: She is using no plan.       The following portions of the patient's history were reviewed and updated as appropriate: allergies, current medications, past family history, past medical history, past social history, past surgical history and problem list.   Objective:    BP 134/70 mmHg  Pulse 78  Ht 5\' 8"  (1.727 m)  Wt 244 lb (110.678 kg)  BMI 37.11 kg/m2   Lab Review A1c = 6.4% (01/16/2015) Urine microalbumin - neg (01/16/2015) Lipids: LDL = 112 Tg = 109 TC= 185 Started pravastatin 20mg  1 tablet daily    Assessment:    Diabetes Mellitus type 2, under good control - but episode of hypoglycemia and unwanted side effects from medications / weight gain Obesity Anxiety / bipolar.    Plan:    1.  RX changes: discontinue glipizide 5mg .     Start Invokana 100mg  1 tablet daily - gave #10 samples  and Rx. 2.  Education:  blood sugar goals, hypoglycemia prevention and treatment, exercise, self-monitoring of blood glucose skills, nutrition and carbohydrate counting 3.  Gave information about area counseling centers that accept Medcaid in case she is interested in change but I also encouraged her to contact her current psychiatrist with questions and concerns 4.  Efforts to improve compliance (if necessary) will be directed at dietary modifications: discussed CHO counting and calorie limiting diet - limit surgar intake, increase non starchy vegetables and fresh fruits, not fruit juice, increase lean protein sources and whole grains. (patient was given weekly diet journal to bring back to next visit), increased exercise and regular blood sugar monitoring: 1 time daily. 5. Follow up: I recommend diabetes care be 1 month.    Cherre Robins, PharmD, CPP, CDE   Short term Goals to be assessed at next appt:  1.  Improve dietary choices with lower calorie and carbohydrate intake.   2.  Start physical activity to 5 minutes at least 5 days per week.   3.  Weight loss of 2#   4.  Check BG daily

## 2015-01-23 ENCOUNTER — Other Ambulatory Visit: Payer: Self-pay | Admitting: *Deleted

## 2015-01-23 MED ORDER — TIOTROPIUM BROMIDE MONOHYDRATE 18 MCG IN CAPS: 18.0000 ug | ORAL_CAPSULE | Freq: Every day | RESPIRATORY_TRACT | Status: DC

## 2015-02-19 ENCOUNTER — Telehealth: Payer: Self-pay | Admitting: Family

## 2015-02-19 NOTE — Telephone Encounter (Signed)
Approval needed for medication with wrong insurance.

## 2015-02-25 ENCOUNTER — Ambulatory Visit: Payer: Self-pay

## 2015-03-05 HISTORY — PX: SUBMANDIBULAR GLAND EXCISION: SHX2456

## 2015-03-06 ENCOUNTER — Ambulatory Visit: Payer: Self-pay

## 2015-03-11 ENCOUNTER — Other Ambulatory Visit: Payer: Self-pay | Admitting: Family Medicine

## 2015-03-25 ENCOUNTER — Ambulatory Visit: Payer: Self-pay

## 2015-04-01 ENCOUNTER — Encounter (HOSPITAL_COMMUNITY): Payer: Self-pay | Admitting: Dentistry

## 2015-04-01 ENCOUNTER — Ambulatory Visit (HOSPITAL_COMMUNITY): Payer: Self-pay | Admitting: Dentistry

## 2015-04-01 VITALS — BP 126/62 | HR 73 | Temp 98.1°F

## 2015-04-01 DIAGNOSIS — Z01818 Encounter for other preprocedural examination: Secondary | ICD-10-CM

## 2015-04-01 DIAGNOSIS — K030 Excessive attrition of teeth: Secondary | ICD-10-CM

## 2015-04-01 DIAGNOSIS — K036 Deposits [accretions] on teeth: Secondary | ICD-10-CM

## 2015-04-01 DIAGNOSIS — R682 Dry mouth, unspecified: Secondary | ICD-10-CM

## 2015-04-01 DIAGNOSIS — K083 Retained dental root: Secondary | ICD-10-CM

## 2015-04-01 DIAGNOSIS — M264 Malocclusion, unspecified: Secondary | ICD-10-CM

## 2015-04-01 DIAGNOSIS — C080 Malignant neoplasm of submandibular gland: Secondary | ICD-10-CM | POA: Insufficient documentation

## 2015-04-01 DIAGNOSIS — K117 Disturbances of salivary secretion: Secondary | ICD-10-CM

## 2015-04-01 DIAGNOSIS — K08409 Partial loss of teeth, unspecified cause, unspecified class: Secondary | ICD-10-CM

## 2015-04-01 DIAGNOSIS — M2632 Excessive spacing of fully erupted teeth: Secondary | ICD-10-CM

## 2015-04-01 DIAGNOSIS — K0530 Chronic periodontitis, unspecified: Secondary | ICD-10-CM

## 2015-04-01 DIAGNOSIS — K029 Dental caries, unspecified: Secondary | ICD-10-CM

## 2015-04-01 NOTE — Patient Instructions (Signed)

## 2015-04-01 NOTE — Progress Notes (Signed)
DENTAL CONSULTATION  Date of Consultation:  04/01/2015 Patient Name:   [name] Date of Birth:   Apr 17, 1956 Medical Record Number: [id number]  VITALS: BP 126/62 mmHg  Pulse 73  Temp(Src) 98.1 F (36.7 C) (Oral)  CHIEF COMPLAINT: Patient referred by Dr. Isidore Moos for a preradiation therapy dental examination.   HPI: [name] is a 59 year old female referred by Dr. Isidore Moos for a dental consultation. 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 Patient is status post resection on 03/11/15 by Dr. Redmond Pulling. Patient with anticipated postoperative radiation therapy with Dr. Isidore Moos. Patient is now seen as part of a medically necessary preradiation therapy dental protocol examination.  Patient currently denies acute toothaches, swellings, or abscesses. Patient has been followed by Dr. Joycelyn Schmid for dental care in [place], [place]. Patient is due for a dental cleaning next week. Patient is seen on every 6 month basis by patient report. Patient was previously by Dental medicine for evaluation in 2008 as part of a pre-chemoradiation therapy dental protocol for breast cancer. Patient had initial periodontal therapy and multiple dental restorations at that time. The patient denies having any partial dentures.   PROBLEM LIST: Patient Active Problem List   Diagnosis Date Noted  . 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 Vitamin D deficiency 01/18/2015  . Hyperlipidemia 01/18/2015  . GAD (generalized anxiety disorder) 01/16/2015  . COPD (chronic obstructive pulmonary disease) 01/16/2015  . Diabetes mellitus 01/16/2015  . Insomnia 01/16/2015  . Hx of adenomatous colonic polyps 03/08/2012  . GERD (gastroesophageal reflux disease) 03/08/2012    PMH: Past Medical History  Diagnosis Date  . Diabetes mellitus   . COPD (chronic obstructive pulmonary disease) 2011  . GERD (gastroesophageal reflux disease)   .  Hypertriglyceridemia     ?  Marland Kitchen Allergic rhinitis   . Breast cancer 2008    chemotherapy, XRT  . Reflux esophagitis 2008    egd  . Adenomatous colon polyp 01/2007  . Anxiety   . Bipolar disorder     [facility]    PSH: Past Surgical History  Procedure Laterality Date  . Esophagogastroduodenoscopy  02/14/07    distal esophageal erosions   . Colonoscopy  02/14/07    engorged external and internal hemorrhoids with recent bleeding, diminutive polyp in the cecum, tubular adenoma, normal TI  . Mastectomy partial / lumpectomy  2008  . Tubal ligation    . Ectopic pregnancy surgery    . Vaginal hysterectomy  10/2007    with vaginal vault suspension  . Colonoscopy  03/30/2012    Procedure: COLONOSCOPY;  Surgeon: Daneil Dolin, MD;  Location: [facility];  Service: Endoscopy;  Laterality: N/A;  10:45    ALLERGIES: Allergies  Allergen Reactions  . Penicillins Anaphylaxis and Swelling  . Bee Venom     Bee stings     MEDICATIONS: Current Outpatient Prescriptions  Medication Sig Dispense Refill  . Albuterol Sulfate (PROVENTIL HFA IN) Inhale into the lungs as needed.    . ALPRAZolam (XANAX) 1 MG tablet Take 1 mg by mouth 2 (two) times daily.     Marland Kitchen aspirin 81 MG tablet Take 81 mg by mouth daily.    Marland Kitchen FLUoxetine (PROZAC) 40 MG capsule Take 40 mg by mouth daily.    . Fluticasone-Salmeterol (ADVAIR) 250-50 MCG/DOSE AEPB Inhale 1 puff into the lungs every 12 (twelve) hours.    Marland Kitchen ibuprofen (ADVIL,MOTRIN) 600 MG tablet Take 600 mg by mouth every  8 (eight) hours as needed. Patient takes only as needed - less than daily    . INVOKANA 100 MG TABS tablet TAKE 1 TABLET BY MOUTH EVERY DAY 30 tablet 1  . lisinopril (PRINIVIL,ZESTRIL) 2.5 MG tablet Take 2.5 mg by mouth daily.    Marland Kitchen loratadine (CLARITIN) 10 MG tablet Take 10 mg by mouth daily.    . meloxicam (MOBIC) 15 MG tablet Take 15 mg by mouth at bedtime.     . metFORMIN (GLUCOPHAGE) 1000 MG tablet Take 1,000 mg by mouth 2 (two) times daily with a  meal.    . omeprazole (PRILOSEC) 40 MG capsule Take 40 mg by mouth daily.    Marland Kitchen oxybutynin (DITROPAN) 5 MG tablet Take 5 mg by mouth 2 (two) times daily.    . pravastatin (PRAVACHOL) 20 MG tablet Take 1 tablet (20 mg total) by mouth daily. (Patient not taking: Reported on 01/21/2015) 90 tablet 3  . tiotropium (SPIRIVA HANDIHALER) 18 MCG inhalation capsule Place 1 capsule (18 mcg total) into inhaler and inhale daily. 30 capsule 5  . traZODone (DESYREL) 150 MG tablet Take 150 mg by mouth at bedtime.     . Vitamin D, Ergocalciferol, (DRISDOL) 50000 UNITS CAPS capsule Take 1 capsule (50,000 Units total) by mouth every 7 (seven) days. (Patient not taking: Reported on 01/21/2015) 30 capsule 6   No current facility-administered medications for this visit.    LABS: Lab Results  Component Value Date   WBC 4.4 10/29/2007   HGB 10.7* 10/29/2007   HCT 31.6* 10/29/2007   MCV 91.3 10/29/2007   PLT 197 10/29/2007      Component Value Date/Time   NA 137 01/16/2015 1200   NA 136 08/05/2010 1647   K 4.5 01/16/2015 1200   CL 99 01/16/2015 1200   CO2 22 01/16/2015 1200   GLUCOSE 146* 01/16/2015 1200   GLUCOSE 190* 08/05/2010 1647   BUN 17 01/16/2015 1200   BUN 9 08/05/2010 1647   CREATININE 1.03* 01/16/2015 1200   CALCIUM 9.3 01/16/2015 1200   GFRNONAA 60 01/16/2015 1200   GFRAA 69 01/16/2015 1200   No results found for: INR, PROTIME No results found for: PTT  SOCIAL HISTORY: History   Social History  . Marital Status: Married    Spouse Name: N/A  . Number of Children: 3  . Years of Education: N/A   Occupational History  . Not on file.   Social History Main Topics  . Smoking status: Former Smoker -- 1.50 packs/day for 25 years    Types: Cigarettes    Quit date: 01/09/1996  . Smokeless tobacco: Never Used  . Alcohol Use: No  . Drug Use: No  . Sexual Activity: Not on file   Other Topics Concern  . Not on file   Social History Narrative    FAMILY HISTORY: Family History   Problem Relation Age of Onset  . Colon cancer Paternal Grandmother 4    deceased  . Liver disease Neg Hx   . Diabetes Daughter 7    type 1  . Diabetes Mother   . Hypertension Mother   . Cancer Mother     REVIEW OF SYSTEMS: Reviewed with the patient and is included in dental record.  DENTAL HISTORY: CHIEF COMPLAINT: Patient referred by Dr. Isidore Moos for a preradiation therapy dental examination.   HPI: [name] is a 59 year old female referred by Dr. Isidore Moos for a dental consultation. Patient with recent diagnosis of adenoid cystic carcinoma of the left submandibular  gland. Patient is status post resection on 03/11/15 by Dr. Redmond Pulling. Patient with anticipated postoperative radiation therapy with Dr. Isidore Moos. Patient is now seen as part of a medically necessary preradiation therapy dental protocol examination.  Patient currently denies acute toothaches, swellings, or abscesses. Patient has been followed by Dr. Joycelyn Schmid for dental care in [place], [place]. Patient is due for a dental cleaning next week. Patient is seen on every 6 month basis by patient report. Patient was previously by Dental medicine for evaluation in 2008 as part of a pre-chemoradiation therapy dental protocol for breast cancer. Patient had initial periodontal therapy and multiple dental restorations at that time. The patient denies having any partial dentures.  DENTAL EXAMINATION: GENERAL: The patient is a well-developed, well-nourished female in no acute distress. HEAD AND NECK: The patient has left neck consistent with previous submandibular gland surgery. There is no right neck lymphadenopathy. The patient has acute TMJ symptoms at this time. Maximum interincisal opening is measured at 37 mm. The patient has a history of temporal mandibular joint dysfunction on the left side with no history of occlusal splint therapy or surgery. INTRAORAL EXAM: The patient has moderate xerostomia. There is no evidence of oral  abscess formation. DENTITION: The patient is missing tooth numbers 2, 3, 14, 15, 16, 19, and 30. There is a retained root tip in the area of #15. PERIODONTAL: The patient has chronic periodontitis with plaque and calculus accumulations, gingival recession, and incipient mandibular anterior tooth mobility. DENTAL CARIES/SUBOPTIMAL RESTORATIONS: Tooth #5 has mesial caries/craze line with need for restoration. ENDODONTIC: The patient currently denies acute pulpitis symptoms. I do not see any evidence of periapical pathology.  CROWN AND BRIDGE: There are no crown or bridge restorations. PROSTHODONTIC: There are no partial dentures. OCCLUSION: The patient has a poor occlusal scheme secondary to multiple missing teeth, supra-eruption and drifting of the unopposed teeth into the edentulous areas, multiple diastemas, and lack of replacement of all missing teeth with dental prostheses.  RADIOGRAPHIC INTERPRETATION:  An orthopantogram was taken on 04/01/2015 and supplemented with a full series of periapical radiographs. There are multiple missing teeth. There are multiple diastemas noted. There is supra-eruption and drifting of the unopposed teeth into the edentulous areas. There is incipient to moderate bone loss noted. Multiple dental restorations are noted. There is decreased density of bone of the body of the mandible possibly consistent with osteoporosis. There is a retained root tip in the area #15 rounded by bone and with no obvious periapical pathology.  ASSESSMENTS: 1.  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 Pre-radiation therapy dental protocol examination 3. Chronic periodontitis of bone loss 4. Accretions 5. Multiple missing teeth 6. Retained root tip in the area of #15. 7. Multiple diastemas 8. Supra-eruption and drifting of the unopposed teeth into the edentulous areas 9. No history of partial dentures 10. Malocclusion 11. Mesial caries or craze line on tooth #5 with  need for evaluation for restoration.  PLAN/RECOMMENDATIONS: 1. I discussed the risks, benefits, and complications of various treatment options with the patient in relationship to her medical and dental conditions, anticipated radiation therapy, and radiation therapy side effects to include xerostomia, radiation caries, trismus, mucositis, taste changes, gum and jawbone changes, and risk for infection, and osteoradionecrosis. We discussed various treatment options to include no treatment, multiple extractions with alveoloplasty, pre-prosthetic surgery as indicated, periodontal therapy, dental restorations, root canal therapy, crown and bridge therapy, implant therapy, and replacement of missing teeth as indicated.  We also discussed  fabrication of fluoride trays and scatter guards. We also discussed referral to an oral surgeon for indicated dental extractions. The patient currently wishes to proceed with referral to an oral surgeon for the indicated extractions. Currently the patient needs extraction of tooth #17 and 18 with consideration of tooth numbers 20 and 21 if theyl will be in the primary field of radiation therapy at a dose greater than 5000 cGy. Discussion with Dr. Isidore Moos concerning ports and doses is pending.  The fluoride trays and scatter guards will then be fabricated and inserted after the dental extractions. The patient is to keep her appointment for a dental cleaning with her primary dentist. Hopefully the primary dentist will be able to provide a dental restoration on tooth #5 at that time. A prescription for FluoriSHIELD fluoride therapy will be provided at the time of the insertion of the fluoride trays.   2. Discussion of findings with medical team and coordination of future medical and dental care as needed.  I spent in excess of  120 minutes during the conduct of this consultation and >50% of this time involved direct face-to-face encounter for counseling and/or coordination of the  patient's care.    Lenn Cal, DDS

## 2015-04-16 ENCOUNTER — Other Ambulatory Visit: Payer: Self-pay | Admitting: Radiation Oncology

## 2015-04-17 ENCOUNTER — Ambulatory Visit (HOSPITAL_COMMUNITY): Payer: Medicaid - Dental | Admitting: Dentistry

## 2015-04-17 ENCOUNTER — Encounter (HOSPITAL_COMMUNITY): Payer: Self-pay | Admitting: Dentistry

## 2015-04-17 VITALS — BP 137/53 | HR 67 | Temp 98.2°F

## 2015-04-17 DIAGNOSIS — Z463 Encounter for fitting and adjustment of dental prosthetic device: Secondary | ICD-10-CM

## 2015-04-17 DIAGNOSIS — C080 Malignant neoplasm of submandibular gland: Secondary | ICD-10-CM

## 2015-04-17 DIAGNOSIS — Z01818 Encounter for other preprocedural examination: Secondary | ICD-10-CM

## 2015-04-17 DIAGNOSIS — K08409 Partial loss of teeth, unspecified cause, unspecified class: Secondary | ICD-10-CM

## 2015-04-17 MED ORDER — OXYCODONE-ACETAMINOPHEN 5-325 MG PO TABS: ORAL_TABLET | ORAL | Status: DC

## 2015-04-17 MED ORDER — SODIUM FLUORIDE 1.1 % DT GEL: DENTAL | Status: DC

## 2015-04-17 NOTE — Progress Notes (Signed)
Limited Oral examination:  04/17/2015   [name] [id number]  VITALS: BP 137/53 mmHg  Pulse 67  Temp(Src) 98.2 F (36.8 C) (Oral)  LABS:  Lab Results  Component Value Date   WBC 4.4 10/29/2007   HGB 10.7* 10/29/2007   HCT 31.6* 10/29/2007   MCV 91.3 10/29/2007   PLT 197 10/29/2007   BMET    Component Value Date/Time   NA 137 01/16/2015 1200   NA 136 08/05/2010 1647   K 4.5 01/16/2015 1200   CL 99 01/16/2015 1200   CO2 22 01/16/2015 1200   GLUCOSE 146* 01/16/2015 1200   GLUCOSE 190* 08/05/2010 1647   BUN 17 01/16/2015 1200   BUN 9 08/05/2010 1647   CREATININE 1.03* 01/16/2015 1200   CALCIUM 9.3 01/16/2015 1200   GFRNONAA 60 01/16/2015 1200   GFRAA 69 01/16/2015 1200    No results found for: INR, PROTIME No results found for: PTT   [name] is status post extraction of tooth numbers 17, 18, 20, 21 with alveoloplasty on 04/10/2015 with the oral surgeon, Dr. Benson Norway. Patient now presents for examination of healing and insertion of upper and lower fluoride trays. The patient had simulation yesterday with Dr. Isidore Moos. Patient scheduled for follow-up evaluation by Dr. Benson Norway on 04/25/2015.  SUBJECTIVE: Patient is complaining of significant discomfort from the lower left extraction sites. The patient has been using the hydrocodone with acetaminophen for pain medication with minimal relief of pain. Patient has been adding ibuprofen 600 mg twice daily for additional pain control.   EXAM: There is no sign of oral infection, heme, or ooze. Generalized primary closure is noted. Sutures are intact. Clots are present. The patient has relatively good oral hygiene.   PROCEDURE: Appliances were tried in and adjusted as needed. [place] Trismus device was previously fabricated at 37 mm with 25 sticks. Postop instructions were provided and a written and verbal format concerning the use and care of appliances. A prescription for FluoriSHIELD was sent to [facility] with when necessary refills. Patient is to pick up prescription today. All questions were answered.  ASSESSMENT: 1. Post operative healing clinically appears to be acceptable. 2. Postoperative pain not currently relieved by hydrocodone 10mg /325mg  APAP.  PLAN: 1. Patient is to continue salt water rinses as needed to aid healing. 2. Advance diet as tolerated with avoidance of hard foods that will traumatize surgical site.  3. Will prescribe Percocet 5/325 for pain. Patient is to take one to 2 tablets every 4-6 hours as needed for moderate to severe pain. #40. No refills. Dr. Benson Norway was contacted and agreed with prescription for oxycodone at this time. Patient was instructed to avoid taking the hydrocodone and oxycodone at the same time. Patient was cautioned about drowsiness and avoiding drinking and driving while on the pain medication.  4. Follow-up with Dr. Benson Norway on 04/25/2015 as scheduled.  Dr. Isidore Moos to coordinate start of radiation therapy after clearance by Dr. Benson Norway. 5. Patient is return to dental medicine for oral examination during radiation therapy. Patient is to call to schedule this appointment once her radiation therapy schedule is known. 6. Patient to call if questions or problems arise before then.   Lenn Cal, DDS

## 2015-04-17 NOTE — Patient Instructions (Signed)

## 2015-04-19 ENCOUNTER — Ambulatory Visit (INDEPENDENT_AMBULATORY_CARE_PROVIDER_SITE_OTHER): Payer: Medicaid Other | Admitting: Family

## 2015-04-19 ENCOUNTER — Encounter: Payer: Self-pay | Admitting: Family

## 2015-04-19 VITALS — BP 111/70 | HR 65 | Temp 97.2°F | Ht 68.0 in | Wt 231.4 lb

## 2015-04-19 DIAGNOSIS — C080 Malignant neoplasm of submandibular gland: Secondary | ICD-10-CM

## 2015-04-19 DIAGNOSIS — E119 Type 2 diabetes mellitus without complications: Secondary | ICD-10-CM | POA: Diagnosis not present

## 2015-04-19 DIAGNOSIS — G5601 Carpal tunnel syndrome, right upper limb: Secondary | ICD-10-CM

## 2015-04-19 DIAGNOSIS — E559 Vitamin D deficiency, unspecified: Secondary | ICD-10-CM | POA: Diagnosis not present

## 2015-04-19 DIAGNOSIS — G5602 Carpal tunnel syndrome, left upper limb: Secondary | ICD-10-CM

## 2015-04-19 DIAGNOSIS — J449 Chronic obstructive pulmonary disease, unspecified: Secondary | ICD-10-CM | POA: Diagnosis not present

## 2015-04-19 DIAGNOSIS — G4700 Insomnia, unspecified: Secondary | ICD-10-CM

## 2015-04-19 DIAGNOSIS — F411 Generalized anxiety disorder: Secondary | ICD-10-CM

## 2015-04-19 DIAGNOSIS — K219 Gastro-esophageal reflux disease without esophagitis: Secondary | ICD-10-CM

## 2015-04-19 DIAGNOSIS — E785 Hyperlipidemia, unspecified: Secondary | ICD-10-CM | POA: Diagnosis not present

## 2015-04-19 DIAGNOSIS — G5603 Carpal tunnel syndrome, bilateral upper limbs: Secondary | ICD-10-CM

## 2015-04-19 DIAGNOSIS — J309 Allergic rhinitis, unspecified: Secondary | ICD-10-CM | POA: Diagnosis not present

## 2015-04-19 LAB — POCT GLYCOSYLATED HEMOGLOBIN (HGB A1C): HEMOGLOBIN A1C: 7.7

## 2015-04-19 MED ORDER — FLUTICASONE PROPIONATE 50 MCG/ACT NA SUSP: 2.0000 | Freq: Every day | NASAL | Status: DC

## 2015-04-19 MED ORDER — ALPRAZOLAM 1 MG PO TABS: 1.0000 mg | ORAL_TABLET | Freq: Two times a day (BID) | ORAL | Status: DC

## 2015-04-19 NOTE — Addendum Note (Signed)
Addended by: Earlene Plater on: 04/19/2015 12:57 PM   Modules accepted: Miquel Dunn

## 2015-04-19 NOTE — Progress Notes (Signed)
Subjective:    Patient ID: [name], female    DOB: Sep 17, 1956, 59 y.o.   MRN: [id number]  Diabetes She presents for her follow-up diabetic visit. She has type 2 diabetes mellitus. Her disease course has been stable. Hypoglycemia symptoms include confusion, dizziness and nervousness/anxiousness. Pertinent negatives for hypoglycemia include no headaches. Associated symptoms include foot paresthesias. Pertinent negatives for diabetes include no blurred vision, no foot ulcerations and no visual change. There are no hypoglycemic complications. Pertinent negatives for hypoglycemia complications include no blackouts. Symptoms are stable. Diabetic complications include peripheral neuropathy. Pertinent negatives for diabetic complications include no CVA, heart disease or nephropathy. Risk factors for coronary artery disease include diabetes mellitus, obesity and post-menopausal. When asked about current treatments, none were reported. She is compliant with treatment all of the time. She is following a generally healthy diet. (Pt not taking blood sugars  ) An ACE inhibitor/angiotensin II receptor blocker is being taken. Eye exam is current.  Hyperlipidemia This is a chronic problem. The current episode started more than 1 year ago. The problem is uncontrolled. Recent lipid tests were reviewed and are high. Exacerbating diseases include diabetes. Pertinent negatives include no shortness of breath. Current antihyperlipidemic treatment includes statins. The current treatment provides moderate improvement of lipids. Risk factors for coronary artery disease include diabetes mellitus, dyslipidemia, family history, obesity and post-menopausal.  Gastrophageal Reflux She reports no belching, no choking, no coughing, no heartburn, no sore throat or no tooth decay. This is a chronic problem. The current episode started more than 1 year ago. The problem occurs rarely. The symptoms are aggravated by certain foods and  lying down. Pertinent negatives include no muscle weakness. She has tried a PPI for the symptoms. The treatment provided significant relief.  Anxiety Presents for follow-up visit. Symptoms include confusion, dizziness, excessive worry, irritability and nervous/anxious behavior. Patient reports no palpitations or shortness of breath. Symptoms occur most days. The severity of symptoms is severe. Exacerbated by: Pt currently being treated for cancer.   Her past medical history is significant for anxiety/panic attacks and depression. Past treatments include benzodiazephines and SSRIs. The treatment provided mild relief.    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 Pt has had surgery to remove the cancer and is currently being set up for XRT treatments.   Review of Systems  Constitutional: Positive for irritability.  HENT: Negative.  Negative for sore throat.   Eyes: Negative.  Negative for blurred vision.  Respiratory: Negative.  Negative for cough, choking and shortness of breath.   Cardiovascular: Negative.  Negative for palpitations.  Gastrointestinal: Negative.  Negative for heartburn.  Endocrine: Negative.   Genitourinary: Negative.   Musculoskeletal: Negative.  Negative for muscle weakness.  Neurological: Positive for dizziness. Negative for headaches.  Hematological: Negative.   Psychiatric/Behavioral: Positive for confusion. The patient is nervous/anxious.   All other systems reviewed and are negative.      Objective:   Physical Exam  Constitutional: She is oriented to person, place, and time. She appears well-developed and well-nourished. No distress.  HENT:  Head: Normocephalic and atraumatic.  Right Ear: External ear normal.  Left Ear: External ear normal.  Nose: Nose normal.  Scar present on left cervical area, pt just had 4 teeth removed on 4/20 with trace amt of swelling in oral cavity   Eyes: Pupils are equal, round, and  reactive to light.  Neck: Normal range of motion. Neck supple. No thyromegaly present.  Cardiovascular: Normal rate, regular  rhythm, normal heart sounds and intact distal pulses.   No murmur heard. Pulmonary/Chest: Effort normal and breath sounds normal. No respiratory distress. She has no wheezes.  Abdominal: Soft. Bowel sounds are normal. She exhibits no distension. There is no tenderness.  Musculoskeletal: Normal range of motion. She exhibits no edema or tenderness.  Neurological: She is alert and oriented to person, place, and time. She has normal reflexes. No cranial nerve deficit.  Skin: Skin is warm and dry.  Psychiatric: She has a normal mood and affect. Her behavior is normal. Judgment and thought content normal.  Vitals reviewed.     BP 111/70 mmHg  Pulse 65  Temp(Src) 97.2 F (36.2 C) (Oral)  Ht '5\' 8"'  (1.727 m)  Wt 231 lb 6.4 oz (104.962 kg)  BMI 35.19 kg/m2     Assessment & Plan:  1. Gastroesophageal reflux disease, esophagitis presence not specified - CMP14+EGFR  2. Chronic obstructive pulmonary disease, unspecified COPD, unspecified chronic bronchitis type - CMP14+EGFR  3. Type 2 diabetes mellitus without complication -While pt is going through cancer treatments pt has stopped all diabetic medications- Pt told to restart medications  POCT glycosylated hemoglobin (Hb A1C) - CMP14+EGFR  4. GAD (generalized anxiety disorder) - CMP14+EGFR - ALPRAZolam (XANAX) 1 MG tablet; Take 1 tablet (1 mg total) by mouth 2 (two) times daily.  Dispense: 60 tablet; Refill: 3  5. Insomnia - CMP14+EGFR  6. Vitamin D deficiency - CMP14+EGFR - Vit D  25 hydroxy (rtn osteoporosis monitoring)  7. Hyperlipidemia - CMP14+EGFR - Lipid panel  8. 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 Allergic rhinitis, unspecified allergic rhinitis type - fluticasone (FLONASE) 50 MCG/ACT nasal spray; Place 2 sprays into both nostrils daily.  Dispense: 16 g;  Refill: 6  Continue all meds Labs pending Health Maintenance reviewed Diet and exercise encouraged RTO 3 months  Evelina Dun, FNP

## 2015-04-19 NOTE — Patient Instructions (Signed)
Generalized Anxiety Disorder Generalized anxiety disorder (GAD) is a mental disorder. It interferes with life functions, including relationships, work, and school. GAD is different from normal anxiety, which everyone experiences at some point in their lives in response to specific life events and activities. Normal anxiety actually helps [place] prepare for and get through these life events and activities. Normal anxiety goes away after the event or activity is over.  GAD causes anxiety that is not necessarily related to specific events or activities. It also causes excess anxiety in proportion to specific events or activities. The anxiety associated with GAD is also difficult to control. GAD can vary from mild to severe. People with severe GAD can have intense waves of anxiety with physical symptoms (panic attacks).  SYMPTOMS The anxiety and worry associated with GAD are difficult to control. This anxiety and worry are related to many life events and activities and also occur more days than not for 6 months or longer. People with GAD also have three or more of the following symptoms (one or more in children):  Restlessness.   Fatigue.  Difficulty concentrating.   Irritability.  Muscle tension.  Difficulty sleeping or unsatisfying sleep. DIAGNOSIS GAD is diagnosed through an assessment by your health care provider. Your health care provider will ask you questions aboutyour mood,physical symptoms, and events in your life. Your health care provider may ask you about your medical history and use of alcohol or drugs, including prescription medicines. Your health care provider may also do a physical exam and blood tests. Certain medical conditions and the use of certain substances can cause symptoms similar to those associated with GAD. Your health care provider may refer you to a mental health specialist for further evaluation. TREATMENT The following therapies are usually used to treat GAD:    Medication. Antidepressant medication usually is prescribed for long-term daily control. Antianxiety medicines may be added in severe cases, especially when panic attacks occur.   Talk therapy (psychotherapy). Certain types of talk therapy can be helpful in treating GAD by providing support, education, and guidance. A form of talk therapy called cognitive behavioral therapy can teach you healthy ways to think about and react to daily life events and activities.  Stress managementtechniques. These include yoga, meditation, and exercise and can be very helpful when they are practiced regularly. A mental health specialist can help determine which treatment is best for you. Some people see improvement with one therapy. However, other people require a combination of therapies. Document Released: 04/03/2013 Document Revised: 04/23/2014 Document Reviewed: 04/03/2013 [facility] Patient Information 2015 [place], [place]. This information is not intended to replace advice given to you by your health care provider. Make sure you discuss any questions you have with your health care provider. Health Maintenance Adopting a healthy lifestyle and getting preventive care can go a long way to promote health and wellness. Talk with your health care provider about what schedule of regular examinations is right for you. This is a good chance for you to check in with your provider about disease prevention and staying healthy. In between checkups, there are plenty of things you can do on your own. Experts have done a lot of research about which lifestyle changes and preventive measures are most likely to keep you healthy. Ask your health care provider for more information. WEIGHT AND DIET  Eat a healthy diet  Be sure to include plenty of vegetables, fruits, low-fat dairy products, and lean protein.  Do not eat a lot of foods high  in solid fats, added sugars, or salt.  Get regular exercise. This is one of the most  important things you can do for your health.  Most adults should exercise for at least 150 minutes each week. The exercise should increase your heart rate and make you sweat (moderate-intensity exercise).  Most adults should also do strengthening exercises at least twice a week. This is in addition to the moderate-intensity exercise.  Maintain a healthy weight  Body mass index (BMI) is a measurement that can be used to identify possible weight problems. It estimates body fat based on height and weight. Your health care provider can help determine your BMI and help you achieve or maintain a healthy weight.  For females 91 years of age and older:   A BMI below 18.5 is considered underweight.  A BMI of 18.5 to 24.9 is normal.  A BMI of 25 to 29.9 is considered overweight.  A BMI of 30 and above is considered obese.  Watch levels of cholesterol and blood lipids  You should start having your blood tested for lipids and cholesterol at 60 years of age, then have this test every 5 years.  You may need to have your cholesterol levels checked more often if:  Your lipid or cholesterol levels are high.  You are older than 60 years of age.  You are at high risk for heart disease.  CANCER SCREENING   Lung Cancer  Lung cancer screening is recommended for adults 37-100 years old who are at high risk for lung cancer because of a history of smoking.  A yearly low-dose CT scan of the lungs is recommended for people who:  Currently smoke.  Have quit within the past 15 years.  Have at least a 30-pack-year history of smoking. A pack year is smoking an average of one pack of cigarettes a day for 1 year.  Yearly screening should continue until it has been 15 years since you quit.  Yearly screening should stop if you develop a health problem that would prevent you from having lung cancer treatment.  Breast Cancer  Practice breast self-awareness. This means understanding how your breasts  normally appear and feel.  It also means doing regular breast self-exams. Let your health care provider know about any changes, no matter how small.  If you are in your 20s or 30s, you should have a clinical breast exam (CBE) by a health care provider every 1-3 years as part of a regular health exam.  If you are 55 or older, have a CBE every year. Also consider having a breast X-ray (mammogram) every year.  If you have a family history of breast cancer, talk to your health care provider about genetic screening.  If you are at high risk for breast cancer, talk to your health care provider about having an MRI and a mammogram every year.  Breast cancer gene (BRCA) assessment is recommended for women who have family members with BRCA-related cancers. BRCA-related cancers include:  Breast.  Ovarian.  Tubal.  Peritoneal cancers.  Results of the assessment will determine the need for genetic counseling and BRCA1 and BRCA2 testing. Cervical Cancer Routine pelvic examinations to screen for cervical cancer are no longer recommended for nonpregnant women who are considered low risk for cancer of the pelvic organs (ovaries, uterus, and vagina) and who do not have symptoms. A pelvic examination may be necessary if you have symptoms including those associated with pelvic infections. Ask your health care provider if a screening  pelvic exam is right for you.   The Pap test is the screening test for cervical cancer for women who are considered at risk.  If you had a hysterectomy for a problem that was not cancer or a condition that could lead to cancer, then you no longer need Pap tests.  If you are older than 65 years, and you have had normal Pap tests for the past 10 years, you no longer need to have Pap tests.  If you have had past treatment for cervical cancer or a condition that could lead to cancer, you need Pap tests and screening for cancer for at least 20 years after your treatment.  If you  no longer get a Pap test, assess your risk factors if they change (such as having a new sexual partner). This can affect whether you should start being screened again.  Some women have medical problems that increase their chance of getting cervical cancer. If this is the case for you, your health care provider may recommend more frequent screening and Pap tests.  The human papillomavirus (HPV) test is another test that may be used for cervical cancer screening. The HPV test looks for the virus that can cause cell changes in the cervix. The cells collected during the Pap test can be tested for HPV.  The HPV test can be used to screen women 86 years of age and older. Getting tested for HPV can extend the interval between normal Pap tests from three to five years.  An HPV test also should be used to screen women of any age who have unclear Pap test results.  After 60 years of age, women should have HPV testing as often as Pap tests.  Colorectal Cancer  This type of cancer can be detected and often prevented.  Routine colorectal cancer screening usually begins at 60 years of age and continues through 60 years of age.  Your health care provider may recommend screening at an earlier age if you have risk factors for colon cancer.  Your health care provider may also recommend using home test kits to check for hidden blood in the stool.  A small camera at the end of a tube can be used to examine your colon directly (sigmoidoscopy or colonoscopy). This is done to check for the earliest forms of colorectal cancer.  Routine screening usually begins at age 69.  Direct examination of the colon should be repeated every 5-10 years through 60 years of age. However, you may need to be screened more often if early forms of precancerous polyps or small growths are found. Skin Cancer  Check your skin from head to toe regularly.  Tell your health care provider about any new moles or changes in moles,  especially if there is a change in a mole's shape or color.  Also tell your health care provider if you have a mole that is larger than the size of a pencil eraser.  Always use sunscreen. Apply sunscreen liberally and repeatedly throughout the day.  Protect yourself by wearing long sleeves, pants, a wide-brimmed hat, and sunglasses whenever you are outside. HEART DISEASE, DIABETES, AND HIGH BLOOD PRESSURE   Have your blood pressure checked at least every 1-2 years. High blood pressure causes heart disease and increases the risk of stroke.  If you are between 10 years and 108 years old, ask your health care provider if you should take aspirin to prevent strokes.  Have regular diabetes screenings. This involves taking a blood  sample to check your fasting blood sugar level.  If you are at a normal weight and have a low risk for diabetes, have this test once every three years after 60 years of age.  If you are overweight and have a high risk for diabetes, consider being tested at a younger age or more often. PREVENTING INFECTION  Hepatitis B  If you have a higher risk for hepatitis B, you should be screened for this virus. You are considered at high risk for hepatitis B if:  You were born in a country where hepatitis B is common. Ask your health care provider which countries are considered high risk.  Your parents were born in a high-risk country, and you have not been immunized against hepatitis B (hepatitis B vaccine).  You have HIV or AIDS.  You use needles to inject street drugs.  You live with someone who has hepatitis B.  You have had sex with someone who has hepatitis B.  You get hemodialysis treatment.  You take certain medicines for conditions, including cancer, organ transplantation, and autoimmune conditions. Hepatitis C  Blood testing is recommended for:  Everyone born from [birth year] through 1966.  Anyone with known risk factors for hepatitis C. Sexually transmitted  infections (STIs)  You should be screened for sexually transmitted infections (STIs) including gonorrhea and chlamydia if:  You are sexually active and are younger than 60 years of age.  You are older than 60 years of age and your health care provider tells you that you are at risk for this type of infection.  Your sexual activity has changed since you were last screened and you are at an increased risk for chlamydia or gonorrhea. Ask your health care provider if you are at risk.  If you do not have HIV, but are at risk, it may be recommended that you take a prescription medicine daily to prevent HIV infection. This is called pre-exposure prophylaxis (PrEP). You are considered at risk if:  You are sexually active and do not regularly use condoms or know the HIV status of your partner(s).  You take drugs by injection.  You are sexually active with a partner who has HIV. Talk with your health care provider about whether you are at high risk of being infected with HIV. If you choose to begin PrEP, you should first be tested for HIV. You should then be tested every 3 months for as long as you are taking PrEP.  PREGNANCY   If you are premenopausal and you may become pregnant, ask your health care provider about preconception counseling.  If you may become pregnant, take 400 to 800 micrograms (mcg) of folic acid every day.  If you want to prevent pregnancy, talk to your health care provider about birth control (contraception). OSTEOPOROSIS AND MENOPAUSE   Osteoporosis is a disease in which the bones lose minerals and strength with aging. This can result in serious bone fractures. Your risk for osteoporosis can be identified using a bone density scan.  If you are 52 years of age or older, or if you are at risk for osteoporosis and fractures, ask your health care provider if you should be screened.  Ask your health care provider whether you should take a calcium or vitamin D supplement to  lower your risk for osteoporosis.  Menopause may have certain physical symptoms and risks.  Hormone replacement therapy may reduce some of these symptoms and risks. Talk to your health care provider about whether hormone replacement therapy  is right for you.  HOME CARE INSTRUCTIONS   Schedule regular health, dental, and eye exams.  Stay current with your immunizations.   Do not use any tobacco products including cigarettes, chewing tobacco, or electronic cigarettes.  If you are pregnant, do not drink alcohol.  If you are breastfeeding, limit how much and how often you drink alcohol.  Limit alcohol intake to no more than 1 drink per day for nonpregnant women. One drink equals 12 ounces of beer, 5 ounces of wine, or 1 ounces of hard liquor.  Do not use street drugs.  Do not share needles.  Ask your health care provider for help if you need support or information about quitting drugs.  Tell your health care provider if you often feel depressed.  Tell your health care provider if you have ever been abused or do not feel safe at home. Document Released: 06/22/2011 Document Revised: 04/23/2014 Document Reviewed: 11/08/2013 [facility] Patient Information 2015 [place], [place]. This information is not intended to replace advice given to you by your health care provider. Make sure you discuss any questions you have with your health care provider.

## 2015-04-20 LAB — CMP14+EGFR
ALT: 15 IU/L (ref 0–32)
AST: 16 IU/L (ref 0–40)
Albumin/Globulin Ratio: 1.9 (ref 1.1–2.5)
Albumin: 3.9 g/dL (ref 3.6–4.8)
Alkaline Phosphatase: 69 IU/L (ref 39–117)
BUN/Creatinine Ratio: 14 (ref 11–26)
BUN: 15 mg/dL (ref 8–27)
Bilirubin Total: 0.2 mg/dL (ref 0.0–1.2)
CALCIUM: 9 mg/dL (ref 8.7–10.3)
CO2: 23 mmol/L (ref 18–29)
Chloride: 97 mmol/L (ref 97–108)
Creatinine, Ser: 1.09 mg/dL — ABNORMAL HIGH (ref 0.57–1.00)
GFR calc Af Amer: 64 mL/min/{1.73_m2} (ref >59)
GFR calc non Af Amer: 55 mL/min/{1.73_m2} — ABNORMAL LOW (ref >59)
GLUCOSE: 269 mg/dL — AB (ref 65–99)
Globulin, Total: 2.1 g/dL (ref 1.5–4.5)
POTASSIUM: 4.5 mmol/L (ref 3.5–5.2)
SODIUM: 136 mmol/L (ref 134–144)
Total Protein: 6 g/dL (ref 6.0–8.5)

## 2015-04-20 LAB — LIPID PANEL
CHOL/HDL RATIO: 4 ratio (ref 0.0–4.4)
Cholesterol, Total: 157 mg/dL (ref 100–199)
HDL: 39 mg/dL — AB (ref >39)
LDL CALC: 87 mg/dL (ref 0–99)
Triglycerides: 154 mg/dL — ABNORMAL HIGH (ref 0–149)
VLDL Cholesterol Cal: 31 mg/dL (ref 5–40)

## 2015-04-20 LAB — VITAMIN D 25 HYDROXY (VIT D DEFICIENCY, FRACTURES): Vit D, 25-Hydroxy: 35.5 ng/mL (ref 30.0–100.0)

## 2015-04-22 ENCOUNTER — Other Ambulatory Visit: Payer: Self-pay | Admitting: Family

## 2015-04-23 ENCOUNTER — Other Ambulatory Visit: Payer: Self-pay | Admitting: Family

## 2015-04-24 ENCOUNTER — Other Ambulatory Visit: Payer: Self-pay | Admitting: Nurse Practitioner

## 2015-04-30 ENCOUNTER — Telehealth: Payer: Self-pay | Admitting: Family

## 2015-04-30 DIAGNOSIS — J309 Allergic rhinitis, unspecified: Secondary | ICD-10-CM

## 2015-04-30 MED ORDER — FLUTICASONE PROPIONATE 50 MCG/ACT NA SUSP: 2.0000 | Freq: Every day | NASAL | Status: DC

## 2015-04-30 NOTE — Telephone Encounter (Signed)
Prescription sent to pharmacy.

## 2015-05-10 ENCOUNTER — Other Ambulatory Visit: Payer: Self-pay | Admitting: Family

## 2015-05-15 ENCOUNTER — Other Ambulatory Visit: Payer: Self-pay | Admitting: Family

## 2015-07-05 ENCOUNTER — Ambulatory Visit (INDEPENDENT_AMBULATORY_CARE_PROVIDER_SITE_OTHER): Payer: Medicaid Other | Admitting: Family

## 2015-07-05 ENCOUNTER — Encounter: Payer: Self-pay | Admitting: Family

## 2015-07-05 VITALS — BP 113/74 | HR 87 | Temp 96.8°F | Ht 68.0 in | Wt 204.0 lb

## 2015-07-05 DIAGNOSIS — C080 Malignant neoplasm of submandibular gland: Secondary | ICD-10-CM

## 2015-07-05 DIAGNOSIS — E119 Type 2 diabetes mellitus without complications: Secondary | ICD-10-CM

## 2015-07-05 DIAGNOSIS — J449 Chronic obstructive pulmonary disease, unspecified: Secondary | ICD-10-CM | POA: Diagnosis not present

## 2015-07-05 DIAGNOSIS — K219 Gastro-esophageal reflux disease without esophagitis: Secondary | ICD-10-CM

## 2015-07-05 DIAGNOSIS — N3946 Mixed incontinence: Secondary | ICD-10-CM | POA: Diagnosis not present

## 2015-07-05 DIAGNOSIS — E559 Vitamin D deficiency, unspecified: Secondary | ICD-10-CM

## 2015-07-05 DIAGNOSIS — E785 Hyperlipidemia, unspecified: Secondary | ICD-10-CM

## 2015-07-05 DIAGNOSIS — F411 Generalized anxiety disorder: Secondary | ICD-10-CM

## 2015-07-05 DIAGNOSIS — Z23 Encounter for immunization: Secondary | ICD-10-CM

## 2015-07-05 DIAGNOSIS — F32A Depression, unspecified: Secondary | ICD-10-CM

## 2015-07-05 DIAGNOSIS — G4700 Insomnia, unspecified: Secondary | ICD-10-CM | POA: Diagnosis not present

## 2015-07-05 DIAGNOSIS — F329 Major depressive disorder, single episode, unspecified: Secondary | ICD-10-CM | POA: Diagnosis not present

## 2015-07-05 LAB — POCT GLYCOSYLATED HEMOGLOBIN (HGB A1C): Hemoglobin A1C: 6.7

## 2015-07-05 LAB — POCT UA - MICROALBUMIN: Microalbumin Ur, POC: 50 mg/L

## 2015-07-05 MED ORDER — DULOXETINE HCL 60 MG PO CPEP: 60.0000 mg | ORAL_CAPSULE | Freq: Every day | ORAL | Status: DC

## 2015-07-05 MED ORDER — MIRABEGRON ER 25 MG PO TB24: 25.0000 mg | ORAL_TABLET | Freq: Every day | ORAL | Status: DC

## 2015-07-05 NOTE — Addendum Note (Signed)
Addended by: Selmer Dominion on: 07/05/2015 11:00 AM   Modules accepted: Orders

## 2015-07-05 NOTE — Patient Instructions (Addendum)
Health Maintenance Adopting a healthy lifestyle and getting preventive care can go a long way to promote health and wellness. Talk with your health care provider about what schedule of regular examinations is right for you. This is a good chance for you to check in with your provider about disease prevention and staying healthy. In between checkups, there are plenty of things you can do on your own. Experts have done a lot of research about which lifestyle changes and preventive measures are most likely to keep you healthy. Ask your health care provider for more information. WEIGHT AND DIET  Eat a healthy diet  Be sure to include plenty of vegetables, fruits, low-fat dairy products, and lean protein.  Do not eat a lot of foods high in solid fats, added sugars, or salt.  Get regular exercise. This is one of the most important things you can do for your health.  Most adults should exercise for at least 150 minutes each week. The exercise should increase your heart rate and make you sweat (moderate-intensity exercise).  Most adults should also do strengthening exercises at least twice a week. This is in addition to the moderate-intensity exercise.  Maintain a healthy weight  Body mass index (BMI) is a measurement that can be used to identify possible weight problems. It estimates body fat based on height and weight. Your health care provider can help determine your BMI and help you achieve or maintain a healthy weight.  For females 25 years of age and older:   A BMI below 18.5 is considered underweight.  A BMI of 18.5 to 24.9 is normal.  A BMI of 25 to 29.9 is considered overweight.  A BMI of 30 and above is considered obese.  Watch levels of cholesterol and blood lipids  You should start having your blood tested for lipids and cholesterol at 60 years of age, then have this test every 5 years.  You may need to have your cholesterol levels checked more often if:  Your lipid or  cholesterol levels are high.  You are older than 60 years of age.  You are at high risk for heart disease.  CANCER SCREENING   Lung Cancer  Lung cancer screening is recommended for adults 97-92 years old who are at high risk for lung cancer because of a history of smoking.  A yearly low-dose CT scan of the lungs is recommended for people who:  Currently smoke.  Have quit within the past 15 years.  Have at least a 30-pack-year history of smoking. A pack year is smoking an average of one pack of cigarettes a day for 1 year.  Yearly screening should continue until it has been 15 years since you quit.  Yearly screening should stop if you develop a health problem that would prevent you from having lung cancer treatment.  Breast Cancer  Practice breast self-awareness. This means understanding how your breasts normally appear and feel.  It also means doing regular breast self-exams. Let your health care provider know about any changes, no matter how small.  If you are in your 20s or 30s, you should have a clinical breast exam (CBE) by a health care provider every 1-3 years as part of a regular health exam.  If you are 76 or older, have a CBE every year. Also consider having a breast X-ray (mammogram) every year.  If you have a family history of breast cancer, talk to your health care provider about genetic screening.  If you are  at high risk for breast cancer, talk to your health care provider about having an MRI and a mammogram every year.  Breast cancer gene (BRCA) assessment is recommended for women who have family members with BRCA-related cancers. BRCA-related cancers include:  Breast.  Ovarian.  Tubal.  Peritoneal cancers.  Results of the assessment will determine the need for genetic counseling and BRCA1 and BRCA2 testing. Cervical Cancer Routine pelvic examinations to screen for cervical cancer are no longer recommended for nonpregnant women who are considered low  risk for cancer of the pelvic organs (ovaries, uterus, and vagina) and who do not have symptoms. A pelvic examination may be necessary if you have symptoms including those associated with pelvic infections. Ask your health care provider if a screening pelvic exam is right for you.   The Pap test is the screening test for cervical cancer for women who are considered at risk.  If you had a hysterectomy for a problem that was not cancer or a condition that could lead to cancer, then you no longer need Pap tests.  If you are older than 65 years, and you have had normal Pap tests for the past 10 years, you no longer need to have Pap tests.  If you have had past treatment for cervical cancer or a condition that could lead to cancer, you need Pap tests and screening for cancer for at least 20 years after your treatment.  If you no longer get a Pap test, assess your risk factors if they change (such as having a new sexual partner). This can affect whether you should start being screened again.  Some women have medical problems that increase their chance of getting cervical cancer. If this is the case for you, your health care provider may recommend more frequent screening and Pap tests.  The human papillomavirus (HPV) test is another test that may be used for cervical cancer screening. The HPV test looks for the virus that can cause cell changes in the cervix. The cells collected during the Pap test can be tested for HPV.  The HPV test can be used to screen women 30 years of age and older. Getting tested for HPV can extend the interval between normal Pap tests from three to five years.  An HPV test also should be used to screen women of any age who have unclear Pap test results.  After 60 years of age, women should have HPV testing as often as Pap tests.  Colorectal Cancer  This type of cancer can be detected and often prevented.  Routine colorectal cancer screening usually begins at 60 years of  age and continues through 60 years of age.  Your health care provider may recommend screening at an earlier age if you have risk factors for colon cancer.  Your health care provider may also recommend using home test kits to check for hidden blood in the stool.  A small camera at the end of a tube can be used to examine your colon directly (sigmoidoscopy or colonoscopy). This is done to check for the earliest forms of colorectal cancer.  Routine screening usually begins at age 50.  Direct examination of the colon should be repeated every 5-10 years through 60 years of age. However, you may need to be screened more often if early forms of precancerous polyps or small growths are found. Skin Cancer  Check your skin from head to toe regularly.  Tell your health care provider about any new moles or changes in   moles, especially if there is a change in a mole's shape or color.  Also tell your health care provider if you have a mole that is larger than the size of a pencil eraser.  Always use sunscreen. Apply sunscreen liberally and repeatedly throughout the day.  Protect yourself by wearing long sleeves, pants, a wide-brimmed hat, and sunglasses whenever you are outside. HEART DISEASE, DIABETES, AND HIGH BLOOD PRESSURE   Have your blood pressure checked at least every 1-2 years. High blood pressure causes heart disease and increases the risk of stroke.  If you are between 75 years and 42 years old, ask your health care provider if you should take aspirin to prevent strokes.  Have regular diabetes screenings. This involves taking a blood sample to check your fasting blood sugar level.  If you are at a normal weight and have a low risk for diabetes, have this test once every three years after 60 years of age.  If you are overweight and have a high risk for diabetes, consider being tested at a younger age or more often. PREVENTING INFECTION  Hepatitis B  If you have a higher risk for  hepatitis B, you should be screened for this virus. You are considered at high risk for hepatitis B if:  You were born in a country where hepatitis B is common. Ask your health care provider which countries are considered high risk.  Your parents were born in a high-risk country, and you have not been immunized against hepatitis B (hepatitis B vaccine).  You have HIV or AIDS.  You use needles to inject street drugs.  You live with someone who has hepatitis B.  You have had sex with someone who has hepatitis B.  You get hemodialysis treatment.  You take certain medicines for conditions, including cancer, organ transplantation, and autoimmune conditions. Hepatitis C  Blood testing is recommended for:  Everyone born from [birth year] through 1966.  Anyone with known risk factors for hepatitis C. Sexually transmitted infections (STIs)  You should be screened for sexually transmitted infections (STIs) including gonorrhea and chlamydia if:  You are sexually active and are younger than 60 years of age.  You are older than 60 years of age and your health care provider tells you that you are at risk for this type of infection.  Your sexual activity has changed since you were last screened and you are at an increased risk for chlamydia or gonorrhea. Ask your health care provider if you are at risk.  If you do not have HIV, but are at risk, it may be recommended that you take a prescription medicine daily to prevent HIV infection. This is called pre-exposure prophylaxis (PrEP). You are considered at risk if:  You are sexually active and do not regularly use condoms or know the HIV status of your partner(s).  You take drugs by injection.  You are sexually active with a partner who has HIV. Talk with your health care provider about whether you are at high risk of being infected with HIV. If you choose to begin PrEP, you should first be tested for HIV. You should then be tested every 3 months for  as long as you are taking PrEP.  PREGNANCY   If you are premenopausal and you may become pregnant, ask your health care provider about preconception counseling.  If you may become pregnant, take 400 to 800 micrograms (mcg) of folic acid every day.  If you want to prevent pregnancy, talk to your  health care provider about birth control (contraception). OSTEOPOROSIS AND MENOPAUSE   Osteoporosis is a disease in which the bones lose minerals and strength with aging. This can result in serious bone fractures. Your risk for osteoporosis can be identified using a bone density scan.  If you are 65 years of age or older, or if you are at risk for osteoporosis and fractures, ask your health care provider if you should be screened.  Ask your health care provider whether you should take a calcium or vitamin D supplement to lower your risk for osteoporosis.  Menopause may have certain physical symptoms and risks.  Hormone replacement therapy may reduce some of these symptoms and risks. Talk to your health care provider about whether hormone replacement therapy is right for you.  HOME CARE INSTRUCTIONS   Schedule regular health, dental, and eye exams.  Stay current with your immunizations.   Do not use any tobacco products including cigarettes, chewing tobacco, or electronic cigarettes.  If you are pregnant, do not drink alcohol.  If you are breastfeeding, limit how much and how often you drink alcohol.  Limit alcohol intake to no more than 1 drink per day for nonpregnant women. One drink equals 12 ounces of beer, 5 ounces of wine, or 1 ounces of hard liquor.  Do not use street drugs.  Do not share needles.  Ask your health care provider for help if you need support or information about quitting drugs.  Tell your health care provider if you often feel depressed.  Tell your health care provider if you have ever been abused or do not feel safe at home. Document Released: 06/22/2011  Document Revised: 04/23/2014 Document Reviewed: 11/08/2013 [employer] Patient Information 2015 [employer]. This information is not intended to replace advice given to you by your health care provider. Make sure you discuss any questions you have with your health care provider. Generalized Anxiety Disorder Generalized anxiety disorder (GAD) is a mental disorder. It interferes with life functions, including relationships, work, and school. GAD is different from normal anxiety, which everyone experiences at some point in their lives in response to specific life events and activities. Normal anxiety actually helps us prepare for and get through these life events and activities. Normal anxiety goes away after the event or activity is over.  GAD causes anxiety that is not necessarily related to specific events or activities. It also causes excess anxiety in proportion to specific events or activities. The anxiety associated with GAD is also difficult to control. GAD can vary from mild to severe. People with severe GAD can have intense waves of anxiety with physical symptoms (panic attacks).  SYMPTOMS The anxiety and worry associated with GAD are difficult to control. This anxiety and worry are related to many life events and activities and also occur more days than not for 6 months or longer. People with GAD also have three or more of the following symptoms (one or more in children):  Restlessness.   Fatigue.  Difficulty concentrating.   Irritability.  Muscle tension.  Difficulty sleeping or unsatisfying sleep. DIAGNOSIS GAD is diagnosed through an assessment by your health care provider. Your health care provider will ask you questions aboutyour mood,physical symptoms, and events in your life. Your health care provider may ask you about your medical history and use of alcohol or drugs, including prescription medicines. Your health care provider may also do a physical exam and blood tests.  Certain medical conditions and the use of certain substances can cause   symptoms similar to those associated with GAD. Your health care provider may refer you to a mental health specialist for further evaluation. TREATMENT The following therapies are usually used to treat GAD:   Medication. Antidepressant medication usually is prescribed for long-term daily control. Antianxiety medicines may be added in severe cases, especially when panic attacks occur.   Talk therapy (psychotherapy). Certain types of talk therapy can be helpful in treating GAD by providing support, education, and guidance. A form of talk therapy called cognitive behavioral therapy can teach you healthy ways to think about and react to daily life events and activities.  Stress managementtechniques. These include yoga, meditation, and exercise and can be very helpful when they are practiced regularly. A mental health specialist can help determine which treatment is best for you. Some people see improvement with one therapy. However, other people require a combination of therapies. Document Released: 04/03/2013 Document Revised: 04/23/2014 Document Reviewed: 04/03/2013 [facility] Patient Information 2015 [place], [place]. This information is not intended to replace advice given to you by your health care provider. Make sure you discuss any questions you have with your health care provider. Depression Depression refers to feeling sad, low, down in the dumps, blue, gloomy, or empty. In general, there are two kinds of depression:  Normal sadness or normal grief. This kind of depression is one that we all feel from time to time after upsetting life experiences, such as the loss of a job or the ending of a relationship. This kind of depression is considered normal, is short lived, and resolves within a few days to 2 weeks. Depression experienced after the loss of a loved one (bereavement) often lasts longer than 2 weeks but normally gets  better with time.  Clinical depression. This kind of depression lasts longer than normal sadness or normal grief or interferes with your ability to function at home, at work, and in school. It also interferes with your personal relationships. It affects almost every aspect of your life. Clinical depression is an illness. Symptoms of depression can also be caused by conditions other than those mentioned above, such as:  Physical illness. Some physical illnesses, including underactive thyroid gland (hypothyroidism), severe anemia, specific types of cancer, diabetes, uncontrolled seizures, heart and lung problems, strokes, and chronic pain are commonly associated with symptoms of depression.  Side effects of some prescription medicine. In some people, certain types of medicine can cause symptoms of depression.  Substance abuse. Abuse of alcohol and illicit drugs can cause symptoms of depression. SYMPTOMS Symptoms of normal sadness and normal grief include the following:  Feeling sad or crying for short periods of time.  Not caring about anything (apathy).  Difficulty sleeping or sleeping too much.  No longer able to enjoy the things you used to enjoy.  Desire to be by oneself all the time (social isolation).  Lack of energy or motivation.  Difficulty concentrating or remembering.  Change in appetite or weight.  Restlessness or agitation. Symptoms of clinical depression include the same symptoms of normal sadness or normal grief and also the following symptoms:  Feeling sad or crying all the time.  Feelings of guilt or worthlessness.  Feelings of hopelessness or helplessness.  Thoughts of suicide or the desire to harm yourself (suicidal ideation).  Loss of touch with reality (psychotic symptoms). Seeing or hearing things that are not real (hallucinations) or having false beliefs about your life or the people around you (delusions and paranoia). DIAGNOSIS  The diagnosis of  clinical depression is  usually based on how bad the symptoms are and how long they have lasted. Your health care provider will also ask you questions about your medical history and substance use to find out if physical illness, use of prescription medicine, or substance abuse is causing your depression. Your health care provider may also order blood tests. TREATMENT  Often, normal sadness and normal grief do not require treatment. However, sometimes antidepressant medicine is given for bereavement to ease the depressive symptoms until they resolve. The treatment for clinical depression depends on how bad the symptoms are but often includes antidepressant medicine, counseling with a mental health professional, or both. Your health care provider will help to determine what treatment is best for you. Depression caused by physical illness usually goes away with appropriate medical treatment of the illness. If prescription medicine is causing depression, talk with your health care provider about stopping the medicine, decreasing the dose, or changing to another medicine. Depression caused by the abuse of alcohol or illicit drugs goes away when you stop using these substances. Some adults need professional help in order to stop drinking or using drugs. SEEK IMMEDIATE MEDICAL CARE IF:  You have thoughts about hurting yourself or others.  You lose touch with reality (have psychotic symptoms).  You are taking medicine for depression and have a serious side effect. FOR MORE INFORMATION  [employer] on Mental [employer]ness: www.nami.[employer]: https://carter.com/ Document Released: 12/04/2000 Document Revised: 04/23/2014 Document Reviewed: 03/07/2012 [facility] Patient Information 2015 [place], [place]. This information is not intended to replace advice given to you by your health care provider. Make sure you discuss any questions you have with your health care provider. Urinary  Incontinence Urinary incontinence is the involuntary loss of urine from your bladder. CAUSES  There are many causes of urinary incontinence. They include:  Medicines.  Infections.  Prostatic enlargement, leading to overflow of urine from your bladder.  Surgery.  Neurological diseases.  Emotional factors. SIGNS AND SYMPTOMS Urinary Incontinence can be divided into four types:  Urge incontinence. Urge incontinence is the involuntary loss of urine before you have the opportunity to go to the bathroom. There is a sudden urge to void but not enough time to reach a bathroom.  Stress incontinence. Stress incontinence is the sudden loss of urine with any activity that forces urine to pass. It is commonly caused by anatomical changes to the pelvis and sphincter areas of your body.  Overflow incontinence. Overflow incontinence is the loss of urine from an obstructed opening to your bladder. This results in a backup of urine and a resultant buildup of pressure within the bladder. When the pressure within the bladder exceeds the closing pressure of the sphincter, the urine overflows, which causes incontinence, similar to water overflowing a dam.  Total incontinence. Total incontinence is the loss of urine as a result of the inability to store urine within your bladder. DIAGNOSIS  Evaluating the cause of incontinence may require:  A thorough and complete medical and obstetric history.  A complete physical exam.  Laboratory tests such as a urine culture and sensitivities. When additional tests are indicated, they can include:  An ultrasound exam.  Kidney and bladder X-rays.  Cystoscopy. This is an exam of the bladder using a narrow scope.  Urodynamic testing to test the nerve function to the bladder and sphincter areas. TREATMENT  Treatment for urinary incontinence depends on the cause:  For urge incontinence caused by a bacterial infection, antibiotics will be prescribed.  If the urge  incontinence is related to medicines you take, your health care provider may have you change the medicine.  For stress incontinence, surgery to re-establish anatomical support to the bladder or sphincter, or both, will often correct the condition.  For overflow incontinence caused by an enlarged prostate, an operation to open the channel through the enlarged prostate will allow the flow of urine out of the bladder. In women with fibroids, a hysterectomy may be recommended.  For total incontinence, surgery on your urinary sphincter may help. An artificial urinary sphincter (an inflatable cuff placed around the urethra) may be required. In women who have developed a hole-like passage between their bladder and vagina (vesicovaginal fistula), surgery to close the fistula often is required. HOME CARE INSTRUCTIONS  Normal daily hygiene and the use of pads or adult diapers that are changed regularly will help prevent odors and skin damage.  Avoid caffeine. It can overstimulate your bladder.  Use the bathroom regularly. Try about every 2-3 hours to go to the bathroom, even if you do not feel the need to do so. Take time to empty your bladder completely. After urinating, wait a minute. Then try to urinate again.  For causes involving nerve dysfunction, keep a log of the medicines you take and a journal of the times you go to the bathroom. SEEK MEDICAL CARE IF:  You experience worsening of pain instead of improvement in pain after your procedure.  Your incontinence becomes worse instead of better. SEE IMMEDIATE MEDICAL CARE IF:  You experience fever or shaking chills.  You are unable to pass your urine.  You have redness spreading into your groin or down into your thighs. MAKE SURE YOU:   Understand these instructions.   Will watch your condition.  Will get help right away if you are not doing well or get worse. Document Released: 01/14/2005 Document Revised: 09/27/2013 Document Reviewed:  05/16/2013 [facility] Patient Information 2015 [place], [place]. This information is not intended to replace advice given to you by your health care provider. Make sure you discuss any questions you have with your health care provider.

## 2015-07-05 NOTE — Progress Notes (Signed)
Subjective:    Patient ID: [name], female    DOB: [birth year], 59 y.o.   MRN: [id number]  Pt presents to the office today for chronic follow up. 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 Pt is tearful and states she is feeling fatigue and depressed.  Diabetes She presents for her follow-up diabetic visit. She has type 2 diabetes mellitus. Her disease course has been stable. Hypoglycemia symptoms include confusion, dizziness and nervousness/anxiousness. Pertinent negatives for hypoglycemia include no headaches. Associated symptoms include blurred vision and foot paresthesias. Pertinent negatives for diabetes include no foot ulcerations and no visual change. There are no hypoglycemic complications. Pertinent negatives for hypoglycemia complications include no blackouts. Symptoms are stable. Diabetic complications include peripheral neuropathy. Pertinent negatives for diabetic complications include no CVA, heart disease or nephropathy. Risk factors for coronary artery disease include diabetes mellitus, obesity and post-menopausal. Current diabetic treatment includes oral agent (dual therapy). She is compliant with treatment all of the time. She is following a generally healthy diet. (Pt not taking blood sugars  ) An ACE inhibitor/angiotensin II receptor blocker is being taken. Eye exam is not current.  Hyperlipidemia This is a chronic problem. The current episode started more than 1 year ago. The problem is uncontrolled. Recent lipid tests were reviewed and are high. Exacerbating diseases include diabetes. Pertinent negatives include no shortness of breath. Current antihyperlipidemic treatment includes statins. The current treatment provides moderate improvement of lipids. Risk factors for coronary artery disease include diabetes mellitus, dyslipidemia, family history, obesity and post-menopausal.  Gastrophageal Reflux She reports no belching, no choking, no  coughing, no heartburn, no sore throat or no tooth decay. This is a chronic problem. The current episode started more than 1 year ago. The problem occurs rarely. The symptoms are aggravated by certain foods and lying down. Pertinent negatives include no muscle weakness. She has tried a PPI for the symptoms. The treatment provided significant relief.  Anxiety Presents for follow-up visit. Onset was 1 to 6 months ago. The problem has been gradually worsening. Symptoms include confusion, depressed mood, dizziness, excessive worry, irritability, nervous/anxious behavior and panic. Patient reports no palpitations or shortness of breath. Symptoms occur constantly. The severity of symptoms is moderate. Exacerbated by: Pt currently being treated for cancer.   Her past medical history is significant for anxiety/panic attacks and depression. Past treatments include benzodiazephines and SSRIs. The treatment provided mild relief.  COPD  Pt states she uses her Advair BID and states her breathing is good. Pt states she has been staying in the house which helps.  Mixed Incontinence  PT states her mouth has become so dry it feels like sand paper even though she "constantly is drinking water". Pt states she stopped her Ditropan. Pt would like to try another medication   Review of Systems  Constitutional: Positive for irritability.  HENT: Negative.  Negative for sore throat.   Eyes: Positive for blurred vision.  Respiratory: Negative.  Negative for cough, choking and shortness of breath.   Cardiovascular: Negative.  Negative for palpitations.  Gastrointestinal: Negative.  Negative for heartburn.  Endocrine: Negative.   Genitourinary: Negative.   Musculoskeletal: Negative.  Negative for muscle weakness.  Neurological: Positive for dizziness. Negative for headaches.  Hematological: Negative.   Psychiatric/Behavioral: Positive for confusion. The patient is nervous/anxious.   All other systems reviewed and are  negative.      Objective:   Physical Exam  Constitutional: She is oriented to person, place, and time. She appears  well-developed and well-nourished. No distress.  HENT:  Head: Normocephalic and atraumatic.  Right Ear: External ear normal.  Left Ear: External ear normal.  Nose: Nose normal.  Mouth/Throat: Oropharynx is clear and moist.  Eyes: Pupils are equal, round, and reactive to light.  Neck: Normal range of motion. Neck supple. No thyromegaly present.  Cardiovascular: Normal rate, regular rhythm, normal heart sounds and intact distal pulses.   No murmur heard. Pulmonary/Chest: Effort normal and breath sounds normal. No respiratory distress. She has no wheezes.  Abdominal: Soft. Bowel sounds are normal. She exhibits no distension. There is no tenderness.  Musculoskeletal: Normal range of motion. She exhibits no edema or tenderness.  Neurological: She is alert and oriented to person, place, and time. She has normal reflexes. No cranial nerve deficit.  Skin: Skin is warm and dry.  Psychiatric: She has a normal mood and affect. Her behavior is normal. Judgment and thought content normal.  Vitals reviewed.     BP 113/74 mmHg  Pulse 87  Temp(Src) 96.8 F (36 C) (Oral)  Ht '5\' 8"'  (1.727 m)  Wt 204 lb (92.534 kg)  BMI 31.03 kg/m2     Assessment & Plan:  1. Chronic obstructive pulmonary disease, unspecified COPD, unspecified chronic bronchitis type - CMP14+EGFR  2. 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 Type 2 diabetes mellitus without complication - POCT glycosylated hemoglobin (Hb A1C) - POCT UA - Microalbumin - CMP14+EGFR -Referral to Ophthalmology for diabetic eye exam  5. GAD (generalized anxiety disorder) -Pt to stop Prozac and start Cymbalta 60 mg -RTO in 1 month to discuss - CMP14+EGFR - DULoxetine (CYMBALTA) 60 MG  capsule; Take 1 capsule (60 mg total) by mouth daily.  Dispense: 90 capsule; Refill: 3  6. Hyperlipidemia - CMP14+EGFR - Lipid panel  7. Insomnia - CMP14+EGFR  8. Vitamin D deficiency - CMP14+EGFR - Vit D  25 hydroxy (rtn osteoporosis monitoring)  9. Mixed incontinence urge and stress Ditropan stopped r/t to adverse effects Pt started on myrbetriq 25 mg today - CMP14+EGFR - mirabegron ER ([name]) 25 MG TB24 tablet; Take 1 tablet (25 mg total) by mouth daily.  Dispense: 90 tablet; Refill: 3  10. Depression - CMP14+EGFR - Thyroid Panel With TSH - DULoxetine (CYMBALTA) 60 MG capsule; Take 1 capsule (60 mg total) by mouth daily.  Dispense: 90 capsule; Refill: 3   Continue all meds Labs pending Health Maintenance reviewed Diet and exercise encouraged RTO 1 month to discuss GAD, Depression and OAB  Evelina Dun, FNP

## 2015-07-06 LAB — CMP14+EGFR
ALBUMIN: 3.9 g/dL (ref 3.6–4.8)
ALK PHOS: 69 IU/L (ref 39–117)
ALT: 14 IU/L (ref 0–32)
AST: 17 IU/L (ref 0–40)
Albumin/Globulin Ratio: 2 (ref 1.1–2.5)
BILIRUBIN TOTAL: 0.2 mg/dL (ref 0.0–1.2)
BUN / CREAT RATIO: 11 (ref 11–26)
BUN: 10 mg/dL (ref 8–27)
CHLORIDE: 101 mmol/L (ref 97–108)
CO2: 24 mmol/L (ref 18–29)
Calcium: 7.6 mg/dL — ABNORMAL LOW (ref 8.7–10.3)
Creatinine, Ser: 0.88 mg/dL (ref 0.57–1.00)
GFR calc non Af Amer: 72 mL/min/{1.73_m2} (ref >59)
GFR, EST AFRICAN AMERICAN: 83 mL/min/{1.73_m2} (ref >59)
Globulin, Total: 2 g/dL (ref 1.5–4.5)
Glucose: 200 mg/dL — ABNORMAL HIGH (ref 65–99)
POTASSIUM: 3.9 mmol/L (ref 3.5–5.2)
SODIUM: 140 mmol/L (ref 134–144)
Total Protein: 5.9 g/dL — ABNORMAL LOW (ref 6.0–8.5)

## 2015-07-06 LAB — LIPID PANEL
Chol/HDL Ratio: 4 ratio units (ref 0.0–4.4)
Cholesterol, Total: 147 mg/dL (ref 100–199)
HDL: 37 mg/dL — AB (ref >39)
LDL Calculated: 65 mg/dL (ref 0–99)
Triglycerides: 226 mg/dL — ABNORMAL HIGH (ref 0–149)
VLDL Cholesterol Cal: 45 mg/dL — ABNORMAL HIGH (ref 5–40)

## 2015-07-06 LAB — THYROID PANEL WITH TSH
Free Thyroxine Index: 2.9 (ref 1.2–4.9)
T3 UPTAKE RATIO: 28 % (ref 24–39)
T4, Total: 10.2 ug/dL (ref 4.5–12.0)
TSH: 1.56 u[IU]/mL (ref 0.450–4.500)

## 2015-07-06 LAB — VITAMIN D 25 HYDROXY (VIT D DEFICIENCY, FRACTURES): Vit D, 25-Hydroxy: 43 ng/mL (ref 30.0–100.0)

## 2015-07-07 LAB — MICROALBUMIN, URINE: Microalbumin, Urine: 10.6 ug/mL

## 2015-07-11 ENCOUNTER — Telehealth: Payer: Self-pay | Admitting: Family

## 2015-07-11 NOTE — Telephone Encounter (Signed)
Pt aware.

## 2015-07-15 ENCOUNTER — Other Ambulatory Visit: Payer: Self-pay | Admitting: Family

## 2015-07-15 ENCOUNTER — Other Ambulatory Visit: Payer: Self-pay | Admitting: Nurse Practitioner

## 2015-07-19 ENCOUNTER — Ambulatory Visit (INDEPENDENT_AMBULATORY_CARE_PROVIDER_SITE_OTHER): Payer: Medicaid Other | Admitting: Family

## 2015-07-19 ENCOUNTER — Encounter: Payer: Self-pay | Admitting: Family

## 2015-07-19 VITALS — BP 109/70 | HR 95 | Temp 97.7°F | Ht 68.0 in | Wt 194.0 lb

## 2015-07-19 DIAGNOSIS — F329 Major depressive disorder, single episode, unspecified: Secondary | ICD-10-CM | POA: Diagnosis not present

## 2015-07-19 DIAGNOSIS — F32A Depression, unspecified: Secondary | ICD-10-CM

## 2015-07-19 DIAGNOSIS — N3946 Mixed incontinence: Secondary | ICD-10-CM

## 2015-07-19 DIAGNOSIS — R159 Full incontinence of feces: Secondary | ICD-10-CM | POA: Diagnosis not present

## 2015-07-19 DIAGNOSIS — F411 Generalized anxiety disorder: Secondary | ICD-10-CM | POA: Diagnosis not present

## 2015-07-19 NOTE — Patient Instructions (Signed)
Urinary Incontinence Urinary incontinence is the involuntary loss of urine from your bladder. CAUSES  There are many causes of urinary incontinence. They include:  Medicines.  Infections.  Prostatic enlargement, leading to overflow of urine from your bladder.  Surgery.  Neurological diseases.  Emotional factors. SIGNS AND SYMPTOMS Urinary Incontinence can be divided into four types: 1. Urge incontinence. Urge incontinence is the involuntary loss of urine before you have the opportunity to go to the bathroom. There is a sudden urge to void but not enough time to reach a bathroom. 2. Stress incontinence. Stress incontinence is the sudden loss of urine with any activity that forces urine to pass. It is commonly caused by anatomical changes to the pelvis and sphincter areas of your body. 3. Overflow incontinence. Overflow incontinence is the loss of urine from an obstructed opening to your bladder. This results in a backup of urine and a resultant buildup of pressure within the bladder. When the pressure within the bladder exceeds the closing pressure of the sphincter, the urine overflows, which causes incontinence, similar to water overflowing a dam. 4. Total incontinence. Total incontinence is the loss of urine as a result of the inability to store urine within your bladder. DIAGNOSIS  Evaluating the cause of incontinence may require:  A thorough and complete medical and obstetric history.  A complete physical exam.  Laboratory tests such as a urine culture and sensitivities. When additional tests are indicated, they can include:  An ultrasound exam.  Kidney and bladder X-rays.  Cystoscopy. This is an exam of the bladder using a narrow scope.  Urodynamic testing to test the nerve function to the bladder and sphincter areas. TREATMENT  Treatment for urinary incontinence depends on the cause:  For urge incontinence caused by a bacterial infection, antibiotics will be prescribed.  If the urge incontinence is related to medicines you take, your health care provider may have you change the medicine.  For stress incontinence, surgery to re-establish anatomical support to the bladder or sphincter, or both, will often correct the condition.  For overflow incontinence caused by an enlarged prostate, an operation to open the channel through the enlarged prostate will allow the flow of urine out of the bladder. In women with fibroids, a hysterectomy may be recommended.  For total incontinence, surgery on your urinary sphincter may help. An artificial urinary sphincter (an inflatable cuff placed around the urethra) may be required. In women who have developed a hole-like passage between their bladder and vagina (vesicovaginal fistula), surgery to close the fistula often is required. HOME CARE INSTRUCTIONS  Normal daily hygiene and the use of pads or adult diapers that are changed regularly will help prevent odors and skin damage.  Avoid caffeine. It can overstimulate your bladder.  Use the bathroom regularly. Try about every 2-3 hours to go to the bathroom, even if you do not feel the need to do so. Take time to empty your bladder completely. After urinating, wait a minute. Then try to urinate again.  For causes involving nerve dysfunction, keep a log of the medicines you take and a journal of the times you go to the bathroom. SEEK MEDICAL CARE IF:  You experience worsening of pain instead of improvement in pain after your procedure.  Your incontinence becomes worse instead of better. SEE IMMEDIATE MEDICAL CARE IF:  You experience fever or shaking chills.  You are unable to pass your urine.  You have redness spreading into your groin or down into your thighs. MAKE SURE   YOU:   Understand these instructions.   Will watch your condition.  Will get help right away if you are not doing well or get worse. Document Released: 01/14/2005 Document Revised: 09/27/2013 Document  Reviewed: 05/16/2013 [facility] Patient Information 2015 [place], [place]. This information is not intended to replace advice given to you by your health care provider. Make sure you discuss any questions you have with your health care provider. Fecal Incontinence Fecal incontinence is the inability to control your bowels. When you feel the urge to have a bowel movement, you may not be able to wait until you can get to a restroom. Stool may leak from the rectum unexpectedly. CAUSES   Constipation.  Damage to the nerves of the anal sphincter muscles or the rectum.  Diarrhea.  Damage to the anal sphincter muscles.  Loss of storage capacity in the rectum.  Pelvic floor dysfunction. DIAGNOSIS  Your caregiver will ask questions, do a physical exam, and possibly order other tests. These tests may include: 5. Anal manometry. This checks the anal sphincter tightness and its ability to respond to signals, as well as the sensitivity and function of the rectum. 6. Anorectal ultrasonography. This test evaluates the structure of the anal sphincters. 7. Proctography (also called defecography). This test shows how much stool the rectum can hold, how well the rectum holds it, and how well the rectum can get rid of the stool. 8. Proctosigmoidoscopy. This test allows caregivers to look inside the rectum for signs of disease or other problems that could cause fecal incontinence, such as inflammation, tumors, or scar tissue. 9. Anal electromyography. This tests for nerve damage. TREATMENT  Treatment depends on the cause and severity of fecal incontinence. It may include dietary changes, medicine, bowel training, or surgery. More than one treatment may be necessary for successful control. Treatment may include:  Adjusting what and how you eat.  Medicine to help you develop a more regular bowel pattern. Medicine may also be prescribed for diarrhea.  Bowel training to help you learn how to control your bowels  (biofeedback). In some cases, it involves strengthening muscles. In others, it means training the bowels to empty at a specific time of day.  Surgery to repair damaged areas or to replace the anal muscle.  A colostomy if other treatments fail. This involves removing a portion of the bowel. The remaining part is then attached to either the anus, or to a hole in the abdomen (stoma) through which stool leaves the body and is collected in a pouch. HOME CARE INSTRUCTIONS   Eat high fiber foods only if directed by your caregiver. Fiber adds bulk and makes stool easier to control. Oppositely, high fiber foods can act as a laxative and can make the problem worse.  Keep a food diary. List what you eat, how much you eat, and when you have an incontinent episode. After a few days, you may begin to see a pattern involving certain foods and incontinence. After you identify foods that seem to cause problems, cut back on them and see whether incontinence improves.  Avoid the following foods and drinks that often cause diarrhea:  Caffeine.  Spicy foods.  Fatty and greasy foods.  Dairy products (milk, cheese, and ice cream).  Cured or smoked meat like sausage, ham, or [place].  Alcohol.  Fruits like apples, peaches, or pears.  Ask your doctor if you need a vitamin supplement.  Drink enough water and fluids to keep your urine clear or pale yellow.  Wear  cotton underwear and loose clothes that "breathe." Tight clothes that block air can worsen anal problems. Change soiled underwear as soon as possible.  Wash the anal area with water, not soap, after each bowel movement to help with anal discomfort. Use pre-moistened, alcohol-free wipes. Try using non-medicated talcum powder or corn starch to relieve anal discomfort.  Your caregiver may recommend an appropriate cream or ointment that can help prevent skin irritation from direct contact with stool.  Talk to your caregiver if you are having emotional  distress. TIPS  Take a bag containing cleanup supplies and a change of clothing with you everywhere.  Locate public restrooms before you need them so you know where to go.  Use the toilet before heading out.  Wear disposable undergarments or sanitary pads if you think an episode is likely.  Use oral fecal deodorants to add to your comfort level if episodes are frequent. FOR MORE INFORMATION  American Academy [employer]: www.AromatherapyParty.no International Foundation for Functional Gastrointestinal Disorders: www.iffgd.org Document Released: 11/18/2004 Document Revised: 02/29/2012 Document Reviewed: 04/14/2010 [facility] Patient Information 2015 [place], [place]. This information is not intended to replace advice given to you by your health care provider. Make sure you discuss any questions you have with your health care provider.

## 2015-07-19 NOTE — Progress Notes (Signed)
   Subjective:    Patient ID: [name], female    DOB: 1956/07/31, 59 y.o.   MRN: [id number]  HPI Pt presents to the office today to recheck GAD, Depression, and Mixed incontinence. Pt states her anxiety and depression is a lot better since starting the Cymbalta. Pt is continuing the xanax. Pt states her incontinence is worse and states she is having incontinence of stools at times. Pt states she has had her "bladder fall out" years ago and had surgery, but it failed.    Review of Systems  Constitutional: Negative.   HENT: Negative.   Eyes: Negative.   Respiratory: Negative.  Negative for shortness of breath.   Cardiovascular: Negative.  Negative for palpitations.  Gastrointestinal: Negative.   Endocrine: Negative.   Genitourinary: Negative.   Musculoskeletal: Negative.   Neurological: Negative.  Negative for headaches.  Hematological: Negative.   Psychiatric/Behavioral: Negative.   All other systems reviewed and are negative.      Objective:   Physical Exam  Constitutional: She is oriented to person, place, and time. She appears well-developed and well-nourished. No distress.  HENT:  Head: Normocephalic and atraumatic.  Right Ear: External ear normal.  Left Ear: External ear normal.  Nose: Nose normal.  Mouth/Throat: Oropharynx is clear and moist.  Eyes: Pupils are equal, round, and reactive to light.  Neck: Normal range of motion. Neck supple. No thyromegaly present.  Cardiovascular: Normal rate, regular rhythm, normal heart sounds and intact distal pulses.   No murmur heard. Pulmonary/Chest: Effort normal and breath sounds normal. No respiratory distress. She has no wheezes.  Abdominal: Soft. Bowel sounds are normal. She exhibits no distension. There is no tenderness.  Musculoskeletal: Normal range of motion. She exhibits no edema or tenderness.  Neurological: She is alert and oriented to person, place, and time. She has normal reflexes. No cranial nerve deficit.    Skin: Skin is warm and dry.  Psychiatric: She has a normal mood and affect. Her behavior is normal. Judgment and thought content normal.  Vitals reviewed.   BP 109/70 mmHg  Pulse 95  Temp(Src) 97.7 F (36.5 C) (Oral)  Ht 5\' 8"  (1.727 m)  Wt 194 lb (87.998 kg)  BMI 29.50 kg/m2       Assessment & Plan:  1. Mixed incontinence urge and stress Kegel exercises encouraged  -Avoid caffeine -Empty bladder regularly - Ambulatory referral to Urogynecology  2. GAD (generalized anxiety disorder) -Stress management discussed -Continue medications  3. Depression -Stress management discussed -Continue medications   4. Stool incontinence -Bring extra clothing with you at all times - Ambulatory referral to Urogynecology  Evelina Dun, FNP

## 2015-07-24 ENCOUNTER — Other Ambulatory Visit: Payer: Self-pay | Admitting: Family

## 2015-07-24 NOTE — Telephone Encounter (Signed)
Last seen 07/19/15  [facility]

## 2015-08-22 ENCOUNTER — Telehealth: Payer: Self-pay

## 2015-08-22 NOTE — Telephone Encounter (Signed)
Pt has been on oxybutynin in past but could tolerate because of adverse effects

## 2015-08-22 NOTE — Telephone Encounter (Signed)
Medicaid non preferred  [name]  Preferred is Oxybutynin syrup tablet m [facility] or [facility] [employer]

## 2015-08-27 ENCOUNTER — Telehealth: Payer: Self-pay

## 2015-08-27 MED ORDER — SOLIFENACIN SUCCINATE 5 MG PO TABS: 5.0000 mg | ORAL_TABLET | Freq: Every day | ORAL | Status: DC

## 2015-08-27 NOTE — Telephone Encounter (Signed)
Insurance denied mybetriq rx, vesicare 5 mg Prescription sent to pharmacy

## 2015-08-27 NOTE — Telephone Encounter (Signed)
Called Medicaid to try and get [name] authorized and told them she was intolerant to Oxybutynin and they said she needs to try one other of the preferred  [facility] or [facility]

## 2015-08-27 NOTE — Telephone Encounter (Signed)
Patient aware that rx has been changed

## 2015-08-29 ENCOUNTER — Telehealth: Payer: Self-pay

## 2015-08-29 NOTE — Telephone Encounter (Signed)
Insurance denied [name]   She needs to try [place] or [employer]

## 2015-09-09 ENCOUNTER — Other Ambulatory Visit: Payer: Self-pay | Admitting: *Deleted

## 2015-09-09 DIAGNOSIS — F411 Generalized anxiety disorder: Secondary | ICD-10-CM

## 2015-09-09 MED ORDER — ALPRAZOLAM 1 MG PO TABS: 1.0000 mg | ORAL_TABLET | Freq: Two times a day (BID) | ORAL | Status: DC

## 2015-09-09 NOTE — Telephone Encounter (Signed)
Left message on [facility] with authorization

## 2015-09-09 NOTE — Telephone Encounter (Signed)
Last filled 08/12/15, last seen 07/19/15. Route to pool, nurse call in at [place] [phone]

## 2015-10-14 ENCOUNTER — Other Ambulatory Visit: Payer: Self-pay | Admitting: Family

## 2015-10-14 ENCOUNTER — Other Ambulatory Visit: Payer: Self-pay | Admitting: Nurse Practitioner

## 2015-10-21 ENCOUNTER — Ambulatory Visit (INDEPENDENT_AMBULATORY_CARE_PROVIDER_SITE_OTHER): Payer: Medicaid Other | Admitting: Family

## 2015-10-21 ENCOUNTER — Encounter: Payer: Self-pay | Admitting: Family

## 2015-10-21 VITALS — BP 103/76 | HR 90 | Temp 98.0°F | Ht 68.0 in | Wt 179.5 lb

## 2015-10-21 DIAGNOSIS — F411 Generalized anxiety disorder: Secondary | ICD-10-CM

## 2015-10-21 DIAGNOSIS — G4700 Insomnia, unspecified: Secondary | ICD-10-CM | POA: Diagnosis not present

## 2015-10-21 DIAGNOSIS — F329 Major depressive disorder, single episode, unspecified: Secondary | ICD-10-CM | POA: Diagnosis not present

## 2015-10-21 DIAGNOSIS — E785 Hyperlipidemia, unspecified: Secondary | ICD-10-CM

## 2015-10-21 DIAGNOSIS — J449 Chronic obstructive pulmonary disease, unspecified: Secondary | ICD-10-CM

## 2015-10-21 DIAGNOSIS — E119 Type 2 diabetes mellitus without complications: Secondary | ICD-10-CM | POA: Diagnosis not present

## 2015-10-21 DIAGNOSIS — C080 Malignant neoplasm of submandibular gland: Secondary | ICD-10-CM | POA: Diagnosis not present

## 2015-10-21 DIAGNOSIS — E559 Vitamin D deficiency, unspecified: Secondary | ICD-10-CM | POA: Diagnosis not present

## 2015-10-21 DIAGNOSIS — N3946 Mixed incontinence: Secondary | ICD-10-CM | POA: Diagnosis not present

## 2015-10-21 DIAGNOSIS — K219 Gastro-esophageal reflux disease without esophagitis: Secondary | ICD-10-CM | POA: Diagnosis not present

## 2015-10-21 DIAGNOSIS — Z1159 Encounter for screening for other viral diseases: Secondary | ICD-10-CM

## 2015-10-21 DIAGNOSIS — F32A Depression, unspecified: Secondary | ICD-10-CM

## 2015-10-21 MED ORDER — OMEPRAZOLE 40 MG PO CPDR: DELAYED_RELEASE_CAPSULE | ORAL | Status: DC

## 2015-10-21 MED ORDER — MELOXICAM 15 MG PO TABS: 15.0000 mg | ORAL_TABLET | Freq: Every day | ORAL | Status: DC

## 2015-10-21 MED ORDER — ALPRAZOLAM 1 MG PO TABS: 1.0000 mg | ORAL_TABLET | Freq: Two times a day (BID) | ORAL | Status: DC

## 2015-10-21 MED ORDER — TRAZODONE HCL 150 MG PO TABS: 150.0000 mg | ORAL_TABLET | Freq: Every day | ORAL | Status: DC

## 2015-10-21 MED ORDER — MIRABEGRON ER 50 MG PO TB24: 50.0000 mg | ORAL_TABLET | Freq: Every day | ORAL | Status: DC

## 2015-10-21 MED ORDER — PRAVASTATIN SODIUM 20 MG PO TABS: 20.0000 mg | ORAL_TABLET | Freq: Every day | ORAL | Status: DC

## 2015-10-21 NOTE — Progress Notes (Signed)
Subjective:    Patient ID: [name], female    DOB: 09-09-1956, 59 y.o.   MRN: [id number]  Pt presents to the office today for chronic follow up. 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  Diabetes She presents for her follow-up diabetic visit. She has type 2 diabetes mellitus. Her disease course has been stable. Hypoglycemia symptoms include confusion, dizziness and nervousness/anxiousness. Pertinent negatives for hypoglycemia include no headaches. Associated symptoms include blurred vision and foot paresthesias. Pertinent negatives for diabetes include no foot ulcerations and no visual change. There are no hypoglycemic complications. Pertinent negatives for hypoglycemia complications include no blackouts. Symptoms are stable. Diabetic complications include peripheral neuropathy. Pertinent negatives for diabetic complications include no CVA, heart disease or nephropathy. Risk factors for coronary artery disease include diabetes mellitus, obesity and post-menopausal. Current diabetic treatment includes oral agent (dual therapy). She is compliant with treatment all of the time. She is following a generally healthy diet. (Pt not taking blood sugars  ) An ACE inhibitor/angiotensin II receptor blocker is being taken. Eye exam is not current.  Hyperlipidemia This is a chronic problem. The current episode started more than 1 year ago. The problem is controlled. Recent lipid tests were reviewed and are normal. Exacerbating diseases include diabetes. Pertinent negatives include no shortness of breath. Current antihyperlipidemic treatment includes statins. The current treatment provides moderate improvement of lipids. Risk factors for coronary artery disease include diabetes mellitus, dyslipidemia, family history, obesity and post-menopausal.  Gastroesophageal Reflux She reports no belching, no choking, no coughing, no heartburn, no sore throat or no tooth decay. This is a  chronic problem. The current episode started more than 1 year ago. The problem occurs rarely. The symptoms are aggravated by certain foods and lying down. Pertinent negatives include no muscle weakness. She has tried a PPI for the symptoms. The treatment provided significant relief.  Anxiety Presents for follow-up visit. Onset was 1 to 6 months ago. The problem has been gradually worsening. Symptoms include confusion, depressed mood, dizziness, excessive worry, irritability, nervous/anxious behavior, panic and restlessness. Patient reports no palpitations or shortness of breath. Symptoms occur constantly. The severity of symptoms is moderate. Exacerbated by: Pt currently being treated for cancer.   Her past medical history is significant for anxiety/panic attacks and depression. Past treatments include benzodiazephines and SSRIs. The treatment provided mild relief.  Depression      The patient presents with depression.  This is a chronic problem.  The current episode started more than 1 year ago.   The onset quality is gradual.   The problem occurs constantly.  The problem has been waxing and waning since onset.  Associated symptoms include restlessness and sad.  Associated symptoms include no helplessness, no hopelessness and no headaches.     The symptoms are aggravated by family issues.  Compliance with treatment is good.  Past medical history includes anxiety and depression.   COPD  Pt states she uses her Advair BID and states her breathing is good. Pt states she has been staying in the house which helps.  Mixed Incontinence  PT states she is currently taking [name] 50 mg daily. Pt is currently going to Urologists who is manages this. Pt states she is probably going to have surgery soon.      Review of Systems  Constitutional: Positive for irritability.  HENT: Negative.  Negative for sore throat.   Eyes: Positive for blurred vision.  Respiratory: Negative.  Negative for cough, choking and  shortness of breath.   Cardiovascular: Negative.  Negative for palpitations.  Gastrointestinal: Negative.  Negative for heartburn.  Endocrine: Negative.   Genitourinary: Negative.   Musculoskeletal: Negative.  Negative for muscle weakness.  Neurological: Positive for dizziness. Negative for headaches.  Hematological: Negative.   Psychiatric/Behavioral: Positive for depression and confusion. The patient is nervous/anxious.   All other systems reviewed and are negative.      Objective:   Physical Exam  Constitutional: She is oriented to person, place, and time. She appears well-developed and well-nourished. No distress.  HENT:  Head: Normocephalic and atraumatic.  Right Ear: External ear normal.  Left Ear: External ear normal.  Nose: Nose normal.  Mouth/Throat: Oropharynx is clear and moist.  Eyes: Pupils are equal, round, and reactive to light.  Neck: Normal range of motion. Neck supple. No thyromegaly present.  Cardiovascular: Normal rate, regular rhythm, normal heart sounds and intact distal pulses.   No murmur heard. Pulmonary/Chest: Effort normal and breath sounds normal. No respiratory distress. She has no wheezes.  Abdominal: Soft. Bowel sounds are normal. She exhibits no distension. There is no tenderness.  Musculoskeletal: Normal range of motion. She exhibits no edema or tenderness.  Neurological: She is alert and oriented to person, place, and time. She has normal reflexes. No cranial nerve deficit.  Skin: Skin is warm and dry.  Psychiatric: She has a normal mood and affect. Her behavior is normal. Judgment and thought content normal.  Vitals reviewed.   Temp(Src) 98 F (36.7 C) (Oral)  Ht '5\' 8"'  (1.727 m)  Wt 179 lb 8 oz (81.421 kg)  BMI 27.30 kg/m2       Assessment & Plan:  1. 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 Gastroesophageal reflux  disease, esophagitis presence not specified - CMP14+EGFR  4. Type 2 diabetes mellitus without complication, without long-term current use of insulin ([facility]) - POCT glycosylated hemoglobin (Hb A1C) - CMP14+EGFR  5. Depression - CMP14+EGFR  6. GAD (generalized anxiety disorder)  - CMP14+EGFR - ALPRAZolam (XANAX) 1 MG tablet; Take 1 tablet (1 mg total) by mouth 2 (two) times daily.  Dispense: 60 tablet; Refill: 2  7. Hyperlipidemia - CMP14+EGFR - Lipid panel - pravastatin (PRAVACHOL) 20 MG tablet; Take 1 tablet (20 mg total) by mouth daily.  Dispense: 90 tablet; Refill: 3  8. Insomnia  - CMP14+EGFR - traZODone (DESYREL) 150 MG tablet; Take 1 tablet (150 mg total) by mouth at bedtime.  Dispense: 90 tablet; Refill: 1  9. Mixed incontinence urge and stress  - CMP14+EGFR  10. Vitamin D deficiency  - CMP14+EGFR - Vit D  25 hydroxy (rtn osteoporosis monitoring)  11. Need for hepatitis C screening test  - CMP14+EGFR - Hepatitis C antibody   Continue all meds Labs pending Health Maintenance reviewed Diet and exercise encouraged RTO 3 months  Evelina Dun, FNP

## 2015-10-21 NOTE — Patient Instructions (Signed)

## 2015-10-31 ENCOUNTER — Telehealth: Payer: Self-pay | Admitting: Family

## 2015-10-31 NOTE — Telephone Encounter (Signed)
Patient given appointment for tomorrow at 11:10 with [facility].

## 2015-11-01 ENCOUNTER — Encounter: Payer: Self-pay | Admitting: Family

## 2015-11-01 ENCOUNTER — Ambulatory Visit (INDEPENDENT_AMBULATORY_CARE_PROVIDER_SITE_OTHER): Payer: Medicaid Other | Admitting: Family

## 2015-11-01 VITALS — BP 100/65 | HR 100 | Temp 97.8°F | Ht 68.0 in | Wt 180.0 lb

## 2015-11-01 DIAGNOSIS — F32A Depression, unspecified: Secondary | ICD-10-CM

## 2015-11-01 DIAGNOSIS — F329 Major depressive disorder, single episode, unspecified: Secondary | ICD-10-CM

## 2015-11-01 DIAGNOSIS — J020 Streptococcal pharyngitis: Secondary | ICD-10-CM | POA: Diagnosis not present

## 2015-11-01 DIAGNOSIS — C080 Malignant neoplasm of submandibular gland: Secondary | ICD-10-CM

## 2015-11-01 DIAGNOSIS — F411 Generalized anxiety disorder: Secondary | ICD-10-CM | POA: Diagnosis not present

## 2015-11-01 LAB — POCT RAPID STREP A (OFFICE): Rapid Strep A Screen: NEGATIVE

## 2015-11-01 MED ORDER — BUPROPION HCL ER (XL) 150 MG PO TB24
150.0000 mg | ORAL_TABLET | Freq: Every day | ORAL | Status: DC
Start: 2015-11-01 — End: 2016-03-09

## 2015-11-01 NOTE — Progress Notes (Signed)
Subjective:    Patient ID: Kenton Kingfisher, female    DOB: 07/12/1956, 59 y.o.   MRN: [id number]  Pt presents to the office today for sore throat. Pt has a history of left submandibular carcinoma and has had XRT.  Sore Throat  This is a new problem. The current episode started 1 to 4 weeks ago. The problem has been unchanged. The pain is worse on the left side. There has been no fever. The pain is at a severity of 9/10. The pain is moderate. Associated symptoms include congestion, ear pain, headaches, a hoarse voice, a plugged ear sensation, shortness of breath and trouble swallowing. Pertinent negatives include no coughing, ear discharge or vomiting. She has had no exposure to strep or mono. She has tried NSAIDs (flonase) for the symptoms. The treatment provided mild relief.  Depression      The patient presents with depression.  This is a chronic problem.  The current episode started more than 1 year ago.   The onset quality is gradual.   The problem occurs constantly.  The problem has been waxing and waning since onset.  Associated symptoms include fatigue, hopelessness, irritable, restlessness, headaches and sad.  Associated symptoms include no suicidal ideas.     The symptoms are aggravated by family issues.  Past treatments include SNRIs - Serotonin and norepinephrine reuptake inhibitors.  Past medical history includes anxiety and depression.   Anxiety Presents for follow-up visit. Onset was more than 5 years ago. The problem has been waxing and waning. Symptoms include depressed mood, dry mouth, excessive worry, irritability, nervous/anxious behavior, panic, restlessness and shortness of breath. Patient reports no palpitations or suicidal ideas. Symptoms occur most days. The symptoms are aggravated by family issues. The quality of sleep is poor.   Her past medical history is significant for anxiety/panic attacks and depression.      Review of Systems  Constitutional: Positive for  irritability and fatigue.  HENT: Positive for congestion, ear pain, hoarse voice and trouble swallowing. Negative for ear discharge.   Eyes: Negative.   Respiratory: Positive for shortness of breath. Negative for cough.   Cardiovascular: Negative.  Negative for palpitations.  Gastrointestinal: Negative.  Negative for vomiting.  Endocrine: Negative.   Genitourinary: Negative.   Musculoskeletal: Negative.   Neurological: Positive for headaches.  Hematological: Negative.   Psychiatric/Behavioral: Positive for depression. Negative for suicidal ideas. The patient is nervous/anxious.   All other systems reviewed and are negative.      Objective:   Physical Exam  Constitutional: She is oriented to person, place, and time. She appears well-developed and well-nourished. She is irritable. No distress.  HENT:  Head: Normocephalic and atraumatic.  Right Ear: External ear normal.  Left Ear: External ear normal.  Nose: Nose normal.  Oropharynx erythemas    Eyes: Pupils are equal, round, and reactive to light.  Neck: Normal range of motion. Neck supple. No thyromegaly present.  Cardiovascular: Normal rate, regular rhythm, normal heart sounds and intact distal pulses.   No murmur heard. Pulmonary/Chest: Effort normal and breath sounds normal. No respiratory distress. She has no wheezes.  Abdominal: Soft. Bowel sounds are normal. She exhibits no distension. There is no tenderness.  Musculoskeletal: Normal range of motion. She exhibits no edema or tenderness.  Neurological: She is alert and oriented to person, place, and time. She has normal reflexes. No cranial nerve deficit.  Skin: Skin is warm and dry.  Psychiatric: She has a normal mood and affect. Her behavior is normal.  Judgment and thought content normal.  Vitals reviewed.     BP 100/65 mmHg  Pulse 100  Temp(Src) 97.8 F (36.6 C) (Oral)  Ht 5\' 8"  (1.727 m)  Wt 180 lb (81.647 kg)  BMI 27.38 kg/m2     Assessment & Plan:  1.  Streptococcal sore throat - POCT rapid strep A  2. GAD (generalized anxiety disorder) -Stress management discussed -Wellbutrin 150 mg added today -Referral to behavioral health - buPROPion (WELLBUTRIN XL) 150 MG 24 hr tablet; Take 1 tablet (150 mg total) by mouth daily.  Dispense: 90 tablet; Refill: 1 - Ambulatory referral to Psychology  3. Depression - buPROPion (WELLBUTRIN XL) 150 MG 24 hr tablet; Take 1 tablet (150 mg total) by mouth daily.  Dispense: 90 tablet; Refill: 1 - Ambulatory referral to Psychology  4. 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 Pt tearful at times  Evelina Dun, FNP

## 2015-11-01 NOTE — Patient Instructions (Signed)
° °Generalized Anxiety Disorder °Generalized anxiety disorder (GAD) is a mental disorder. It interferes with life functions, including relationships, work, and school. °GAD is different from normal anxiety, which everyone experiences at some point in their lives in response to specific life events and activities. Normal anxiety actually helps us prepare for and get through these life events and activities. Normal anxiety goes away after the event or activity is over.  °GAD causes anxiety that is not necessarily related to specific events or activities. It also causes excess anxiety in proportion to specific events or activities. The anxiety associated with GAD is also difficult to control. GAD can vary from mild to severe. People with severe GAD can have intense waves of anxiety with physical symptoms (panic attacks).  °SYMPTOMS °The anxiety and worry associated with GAD are difficult to control. This anxiety and worry are related to many life events and activities and also occur more days than not for 6 months or longer. People with GAD also have three or more of the following symptoms (one or more in children): °· Restlessness.   °· Fatigue. °· Difficulty concentrating.   °· Irritability. °· Muscle tension. °· Difficulty sleeping or unsatisfying sleep. °DIAGNOSIS °GAD is diagnosed through an assessment by your health care provider. Your health care provider will ask you questions about your mood, physical symptoms, and events in your life. Your health care provider may ask you about your medical history and use of alcohol or drugs, including prescription medicines. Your health care provider may also do a physical exam and blood tests. Certain medical conditions and the use of certain substances can cause symptoms similar to those associated with GAD. Your health care provider may refer you to a mental health specialist for further evaluation. °TREATMENT °The following therapies are usually used to treat GAD:   °· Medication. Antidepressant medication usually is prescribed for long-term daily control. Antianxiety medicines may be added in severe cases, especially when panic attacks occur.   °· Talk therapy (psychotherapy). Certain types of talk therapy can be helpful in treating GAD by providing support, education, and guidance. A form of talk therapy called cognitive behavioral therapy can teach you healthy ways to think about and react to daily life events and activities. °· Stress management techniques. These include yoga, meditation, and exercise and can be very helpful when they are practiced regularly. °A mental health specialist can help determine which treatment is best for you. Some people see improvement with one therapy. However, other people require a combination of therapies. °  °This information is not intended to replace advice given to you by your health care provider. Make sure you discuss any questions you have with your health care provider. °  °Document Released: 04/03/2013 Document Revised: 12/28/2014 Document Reviewed: 04/03/2013 °Elsevier Interactive Patient Education ©2016 [employer] ° °Major Depressive Disorder °Major depressive disorder is a mental illness. It also may be called clinical depression or unipolar depression. Major depressive disorder usually causes feelings of sadness, hopelessness, or helplessness. Some people with this disorder do not feel particularly sad but lose interest in doing things they used to enjoy (anhedonia). Major depressive disorder also can cause physical symptoms. It can interfere with work, school, relationships, and other normal everyday activities. The disorder varies in severity but is longer lasting and more serious than the sadness we all feel from time to time in our lives. °Major depressive disorder often is triggered by stressful life events or major life changes. Examples of these triggers include divorce, loss of your job or home,   a move, and the  death of a family member or close friend. Sometimes this disorder occurs for no obvious reason at all. People who have family members with major depressive disorder or bipolar disorder are at higher risk for developing this disorder, with or without life stressors. Major depressive disorder can occur at any age. It may occur just once in your life (single episode major depressive disorder). It may occur multiple times (recurrent major depressive disorder). °SYMPTOMS °People with major depressive disorder have either anhedonia or depressed mood on nearly a daily basis for at least 2 weeks or longer. Symptoms of depressed mood include: °· Feelings of sadness (blue or down in the dumps) or emptiness. °· Feelings of hopelessness or helplessness. °· Tearfulness or episodes of crying (may be observed by others). °· Irritability (children and adolescents). °In addition to depressed mood or anhedonia or both, people with this disorder have at least four of the following symptoms: °· Difficulty sleeping or sleeping too much.   °· Significant change (increase or decrease) in appetite or weight.   °· Lack of energy or motivation. °· Feelings of guilt and worthlessness.   °· Difficulty concentrating, remembering, or making decisions. °· Unusually slow movement (psychomotor retardation) or restlessness (as observed by others).   °· Recurrent wishes for death, recurrent thoughts of self-harm (suicide), or a suicide attempt. °People with major depressive disorder commonly have persistent negative thoughts about themselves, other people, and the world. People with severe major depressive disorder may experience distorted beliefs or perceptions about the world (psychotic delusions). They also may see or hear things that are not real (psychotic hallucinations). °DIAGNOSIS °Major depressive disorder is diagnosed through an assessment by your health care provider. Your health care provider will ask about aspects of your daily life,  such as mood, sleep, and appetite, to see if you have the diagnostic symptoms of major depressive disorder. Your health care provider may ask about your medical history and use of alcohol or drugs, including prescription medicines. Your health care provider also may do a physical exam and blood work. This is because certain medical conditions and the use of certain substances can cause major depressive disorder-like symptoms (secondary depression). Your health care provider also may refer you to a mental health specialist for further evaluation and treatment. °TREATMENT °It is important to recognize the symptoms of major depressive disorder and seek treatment. The following treatments can be prescribed for this disorder:   °· Medicine. Antidepressant medicines usually are prescribed. Antidepressant medicines are thought to correct chemical imbalances in the brain that are commonly associated with major depressive disorder. Other types of medicine may be added if the symptoms do not respond to antidepressant medicines alone or if psychotic delusions or hallucinations occur. °· Talk therapy. Talk therapy can be helpful in treating major depressive disorder by providing support, education, and guidance. Certain types of talk therapy also can help with negative thinking (cognitive behavioral therapy) and with relationship issues that trigger this disorder (interpersonal therapy). °A mental health specialist can help determine which treatment is best for you. Most people with major depressive disorder do well with a combination of medicine and talk therapy. Treatments involving electrical stimulation of the brain can be used in situations with extremely severe symptoms or when medicine and talk therapy do not work over time. These treatments include electroconvulsive therapy, transcranial magnetic stimulation, and vagal nerve stimulation. °  °This information is not intended to replace advice given to you by your health  care provider. Make sure you discuss any questions you have   with your health care provider. °  °Document Released: 04/03/2013 Document Revised: 12/28/2014 Document Reviewed: 04/03/2013 °Elsevier Interactive Patient Education ©2016 [employer] ° °

## 2015-11-21 ENCOUNTER — Other Ambulatory Visit: Payer: Self-pay | Admitting: Family

## 2015-12-18 ENCOUNTER — Other Ambulatory Visit: Payer: Self-pay | Admitting: Nurse Practitioner

## 2015-12-24 ENCOUNTER — Other Ambulatory Visit: Payer: Self-pay

## 2015-12-24 ENCOUNTER — Other Ambulatory Visit: Payer: Self-pay | Admitting: Family

## 2015-12-24 DIAGNOSIS — Z1231 Encounter for screening mammogram for malignant neoplasm of breast: Secondary | ICD-10-CM

## 2015-12-27 ENCOUNTER — Ambulatory Visit (INDEPENDENT_AMBULATORY_CARE_PROVIDER_SITE_OTHER): Payer: Medicaid Other | Admitting: Family

## 2015-12-27 ENCOUNTER — Encounter: Payer: Self-pay | Admitting: Family

## 2015-12-27 VITALS — BP 129/77 | HR 83 | Temp 97.2°F | Ht 68.0 in | Wt 171.5 lb

## 2015-12-27 DIAGNOSIS — F411 Generalized anxiety disorder: Secondary | ICD-10-CM

## 2015-12-27 DIAGNOSIS — F329 Major depressive disorder, single episode, unspecified: Secondary | ICD-10-CM

## 2015-12-27 DIAGNOSIS — N3946 Mixed incontinence: Secondary | ICD-10-CM

## 2015-12-27 DIAGNOSIS — E119 Type 2 diabetes mellitus without complications: Secondary | ICD-10-CM | POA: Diagnosis not present

## 2015-12-27 DIAGNOSIS — F32A Depression, unspecified: Secondary | ICD-10-CM

## 2015-12-27 LAB — POCT GLYCOSYLATED HEMOGLOBIN (HGB A1C): Hemoglobin A1C: 5.6

## 2015-12-27 MED ORDER — MAGIC MOUTHWASH: 5.0000 mL | Freq: Four times a day (QID) | ORAL | Status: DC | PRN

## 2015-12-27 MED ORDER — ALPRAZOLAM 1 MG PO TABS: 1.0000 mg | ORAL_TABLET | Freq: Two times a day (BID) | ORAL | Status: DC

## 2015-12-27 MED ORDER — FLUCONAZOLE 150 MG PO TABS: 150.0000 mg | ORAL_TABLET | ORAL | Status: DC

## 2015-12-27 MED ORDER — SODIUM FLUORIDE 1.1 % DT GEL: DENTAL | Status: DC

## 2015-12-27 NOTE — Addendum Note (Signed)
Addended by: Evelina Dun A on: 12/27/2015 03:24 PM   Modules accepted: Orders

## 2015-12-27 NOTE — Patient Instructions (Addendum)
Major Depressive Disorder Major depressive disorder is a mental illness. It also may be called clinical depression or unipolar depression. Major depressive disorder usually causes feelings of sadness, hopelessness, or helplessness. Some people with this disorder do not feel particularly sad but lose interest in doing things they used to enjoy (anhedonia). Major depressive disorder also can cause physical symptoms. It can interfere with work, school, relationships, and other normal everyday activities. The disorder varies in severity but is longer lasting and more serious than the sadness we all feel from time to time in our lives. Major depressive disorder often is triggered by stressful life events or major life changes. Examples of these triggers include divorce, loss of your job or home, a move, and the death of a family member or close friend. Sometimes this disorder occurs for no obvious reason at all. People who have family members with major depressive disorder or bipolar disorder are at higher risk for developing this disorder, with or without life stressors. Major depressive disorder can occur at any age. It may occur just once in your life (single episode major depressive disorder). It may occur multiple times (recurrent major depressive disorder). SYMPTOMS People with major depressive disorder have either anhedonia or depressed mood on nearly a daily basis for at least 2 weeks or longer. Symptoms of depressed mood include:  Feelings of sadness (blue or down in the dumps) or emptiness.  Feelings of hopelessness or helplessness.  Tearfulness or episodes of crying (may be observed by others).  Irritability (children and adolescents). In addition to depressed mood or anhedonia or both, people with this disorder have at least four of the following symptoms: 1. Difficulty sleeping or sleeping too much.  2. Significant change (increase or decrease) in appetite or weight.  3. Lack of energy or  motivation. 4. Feelings of guilt and worthlessness.  5. Difficulty concentrating, remembering, or making decisions. 6. Unusually slow movement (psychomotor retardation) or restlessness (as observed by others).  7. Recurrent wishes for death, recurrent thoughts of self-harm (suicide), or a suicide attempt. People with major depressive disorder commonly have persistent negative thoughts about themselves, other people, and the world. People with severe major depressive disorder may experiencedistorted beliefs or perceptions about the world (psychotic delusions). They also may see or hear things that are not real (psychotic hallucinations). DIAGNOSIS Major depressive disorder is diagnosed through an assessment by your health care provider. Your health care provider will ask aboutaspects of your daily life, such as mood,sleep, and appetite, to see if you have the diagnostic symptoms of major depressive disorder. Your health care provider may ask about your medical history and use of alcohol or drugs, including prescription medicines. Your health care provider also may do a physical exam and blood work. This is because certain medical conditions and the use of certain substances can cause major depressive disorder-like symptoms (secondary depression). Your health care provider also may refer you to a mental health specialist for further evaluation and treatment. TREATMENT It is important to recognize the symptoms of major depressive disorder and seek treatment. The following treatments can be prescribed for this disorder:   Medicine. Antidepressant medicines usually are prescribed. Antidepressant medicines are thought to correct chemical imbalances in the brain that are commonly associated with major depressive disorder. Other types of medicine may be added if the symptoms do not respond to antidepressant medicines alone or if psychotic delusions or hallucinations occur.  Talk therapy. Talk therapy can  be helpful in treating major depressive disorder by providing  support, education, and guidance. Certain types of talk therapy also can help with negative thinking (cognitive behavioral therapy) and with relationship issues that trigger this disorder (interpersonal therapy). A mental health specialist can help determine which treatment is best for you. Most people with major depressive disorder do well with a combination of medicine and talk therapy. Treatments involving electrical stimulation of the brain can be used in situations with extremely severe symptoms or when medicine and talk therapy do not work over time. These treatments include electroconvulsive therapy, transcranial magnetic stimulation, and vagal nerve stimulation.   This information is not intended to replace advice given to you by your health care provider. Make sure you discuss any questions you have with your health care provider.   Document Released: 04/03/2013 Document Revised: 12/28/2014 Document Reviewed: 04/03/2013 Elsevier Interactive Patient Education 2016 Else[employer] Generalized Anxiety Disorder Generalized anxiety disorder (GAD) is a mental disorder. It interferes with life functions, including relationships, work, and school. GAD is different from normal anxiety, which everyone experiences at some point in their lives in response to specific life events and activities. Normal anxiety actually helps [place] prepare for and get through these life events and activities. Normal anxiety goes away after the event or activity is over.  GAD causes anxiety that is not necessarily related to specific events or activities. It also causes excess anxiety in proportion to specific events or activities. The anxiety associated with GAD is also difficult to control. GAD can vary from mild to severe. People with severe GAD can have intense waves of anxiety with physical symptoms (panic attacks).  SYMPTOMS The anxiety and worry associated with  GAD are difficult to control. This anxiety and worry are related to many life events and activities and also occur more days than not for 6 months or longer. People with GAD also have three or more of the following symptoms (one or more in children):  Restlessness.   Fatigue.  Difficulty concentrating.   Irritability.  Muscle tension.  Difficulty sleeping or unsatisfying sleep. DIAGNOSIS GAD is diagnosed through an assessment by your health care provider. Your health care provider will ask you questions aboutyour mood,physical symptoms, and events in your life. Your health care provider may ask you about your medical history and use of alcohol or drugs, including prescription medicines. Your health care provider may also do a physical exam and blood tests. Certain medical conditions and the use of certain substances can cause symptoms similar to those associated with GAD. Your health care provider may refer you to a mental health specialist for further evaluation. TREATMENT The following therapies are usually used to treat GAD:  8. Medication. Antidepressant medication usually is prescribed for long-term daily control. Antianxiety medicines may be added in severe cases, especially when panic attacks occur.  9. Talk therapy (psychotherapy). Certain types of talk therapy can be helpful in treating GAD by providing support, education, and guidance. A form of talk therapy called cognitive behavioral therapy can teach you healthy ways to think about and react to daily life events and activities. 10. Stress managementtechniques. These include yoga, meditation, and exercise and can be very helpful when they are practiced regularly. A mental health specialist can help determine which treatment is best for you. Some people see improvement with one therapy. However, other people require a combination of therapies.   This information is not intended to replace advice given to you by your health care  provider. Make sure you discuss any questions you have with your  health care provider.   Document Released: 04/03/2013 Document Revised: 12/28/2014 Document Reviewed: 04/03/2013 Urinary Incontinence Urinary incontinence is the involuntary loss of urine from your bladder. CAUSES  There are many causes of urinary incontinence. They include:  Medicines.  Infections.  Prostatic enlargement, leading to overflow of urine from your bladder.  Surgery.  Neurological diseases.  Emotional factors. SIGNS AND SYMPTOMS Urinary Incontinence can be divided into four types: 11. Urge incontinence. Urge incontinence is the involuntary loss of urine before you have the opportunity to go to the bathroom. There is a sudden urge to void but not enough time to reach a bathroom. 12. Stress incontinence. Stress incontinence is the sudden loss of urine with any activity that forces urine to pass. It is commonly caused by anatomical changes to the pelvis and sphincter areas of your body. 13. Overflow incontinence. Overflow incontinence is the loss of urine from an obstructed opening to your bladder. This results in a backup of urine and a resultant buildup of pressure within the bladder. When the pressure within the bladder exceeds the closing pressure of the sphincter, the urine overflows, which causes incontinence, similar to water overflowing a dam. 14. Total incontinence. Total incontinence is the loss of urine as a result of the inability to store urine within your bladder. DIAGNOSIS  Evaluating the cause of incontinence may require:  A thorough and complete medical and obstetric history.  A complete physical exam.  Laboratory tests such as a urine culture and sensitivities. When additional tests are indicated, they can include:  An ultrasound exam.  Kidney and bladder X-rays.  Cystoscopy. This is an exam of the bladder using a narrow scope.  Urodynamic testing to test the nerve function to the  bladder and sphincter areas. TREATMENT  Treatment for urinary incontinence depends on the cause:  For urge incontinence caused by a bacterial infection, antibiotics will be prescribed. If the urge incontinence is related to medicines you take, your health care provider may have you change the medicine.  For stress incontinence, surgery to re-establish anatomical support to the bladder or sphincter, or both, will often correct the condition.  For overflow incontinence caused by an enlarged prostate, an operation to open the channel through the enlarged prostate will allow the flow of urine out of the bladder. In women with fibroids, a hysterectomy may be recommended.  For total incontinence, surgery on your urinary sphincter may help. An artificial urinary sphincter (an inflatable cuff placed around the urethra) may be required. In women who have developed a hole-like passage between their bladder and vagina (vesicovaginal fistula), surgery to close the fistula often is required. HOME CARE INSTRUCTIONS  Normal daily hygiene and the use of pads or adult diapers that are changed regularly will help prevent odors and skin damage.  Avoid caffeine. It can overstimulate your bladder.  Use the bathroom regularly. Try about every 2-3 hours to go to the bathroom, even if you do not feel the need to do so. Take time to empty your bladder completely. After urinating, wait a minute. Then try to urinate again.  For causes involving nerve dysfunction, keep a log of the medicines you take and a journal of the times you go to the bathroom. SEEK MEDICAL CARE IF:  You experience worsening of pain instead of improvement in pain after your procedure.  Your incontinence becomes worse instead of better. SEE IMMEDIATE MEDICAL CARE IF:  You experience fever or shaking chills.  You are unable to pass your urine.  You have redness spreading into your groin or down into your thighs. MAKE SURE YOU:   Understand  these instructions.   Will watch your condition.  Will get help right away if you are not doing well or get worse.   This information is not intended to replace advice given to you by your health care provider. Make sure you discuss any questions you have with your health care provider.   Document Released: 01/14/2005 Document Revised: 12/28/2014 Document Reviewed: 05/16/2013 Elsevier Interactive Patient Education 2016 [employer]. Chartered certified accountant Patient Education [employer].

## 2015-12-27 NOTE — Progress Notes (Signed)
Subjective:    Patient ID: [name], female    DOB: Nov 05, 1956, 60 y.o.   MRN: [id number]  PT presents to the office today to recheck GAD and Depression.  Anxiety Presents for follow-up visit. Onset was more than 5 years ago. The problem has been waxing and waning. Symptoms include depressed mood, dry mouth, excessive worry, palpitations, panic, restlessness and shortness of breath. Patient reports no irritability or suicidal ideas. Symptoms occur most days. The severity of symptoms is moderate. The symptoms are aggravated by family issues. The quality of sleep is fair.   Her past medical history is significant for anxiety/panic attacks and depression. Past treatments include SSRIs, benzodiazephines and non-SSRI antidepressants. The treatment provided mild relief.  Depression      The patient presents with depression.  This is a chronic problem.  The current episode started more than 1 year ago.   The onset quality is gradual.   The problem occurs intermittently.  The problem has been waxing and waning since onset.  Associated symptoms include hopelessness, restlessness and sad.  Associated symptoms include no helplessness, no headaches and no suicidal ideas.     The symptoms are aggravated by family issues.  Past treatments include SSRIs - Selective serotonin reuptake inhibitors and TCAs - Tricyclic antidepressants.  Compliance with treatment is good.  Previous treatment provided moderate relief.  Past medical history includes depression.   Diabetes She presents for her follow-up diabetic visit. She has type 2 diabetes mellitus. Her disease course has been stable. There are no hypoglycemic associated symptoms. Pertinent negatives for hypoglycemia include no headaches. There are no diabetic associated symptoms. There are no hypoglycemic complications. Symptoms are stable. Pertinent negatives for diabetic complications include no CVA, heart disease, nephropathy or peripheral neuropathy. Risk  factors for coronary artery disease include stress, sedentary lifestyle, post-menopausal, obesity and dyslipidemia. Current diabetic treatment includes oral agent (monotherapy). She is following a generally unhealthy diet. Her breakfast blood glucose range is generally 110-130 mg/dl.      Review of Systems  Constitutional: Negative for irritability.  Eyes: Negative.   Respiratory: Positive for shortness of breath.   Cardiovascular: Positive for palpitations.  Gastrointestinal: Negative.   Endocrine: Negative.   Genitourinary: Negative.   Musculoskeletal: Negative.   Neurological: Negative.  Negative for headaches.  Hematological: Negative.   Psychiatric/Behavioral: Positive for depression. Negative for suicidal ideas.  All other systems reviewed and are negative.      Objective:   Physical Exam  Constitutional: She is oriented to person, place, and time. She appears well-developed and well-nourished. No distress.  HENT:  Head: Normocephalic and atraumatic.  Right Ear: External ear normal.  Left Ear: External ear normal.  Nose: Nose normal.  Mouth/Throat: Oropharynx is clear and moist.  Eyes: Pupils are equal, round, and reactive to light.  Neck: Normal range of motion. Neck supple. No thyromegaly present.  Cardiovascular: Normal rate, regular rhythm, normal heart sounds and intact distal pulses.   No murmur heard. Pulmonary/Chest: Effort normal and breath sounds normal. No respiratory distress. She has no wheezes.  Abdominal: Soft. Bowel sounds are normal. She exhibits no distension. There is no tenderness.  Musculoskeletal: Normal range of motion. She exhibits no edema or tenderness.  Neurological: She is alert and oriented to person, place, and time. She has normal reflexes. No cranial nerve deficit.  Skin: Skin is warm and dry.  Psychiatric: Her behavior is normal. Judgment and thought content normal. Her mood appears anxious. She expresses no suicidal ideation.  Vitals  reviewed.     BP 129/77 mmHg  Pulse 83  Temp(Src) 97.2 F (36.2 C) (Oral)  Ht '5\' 8"'  (1.727 m)  Wt 171 lb 8 oz (77.792 kg)  BMI 26.08 kg/m2     Assessment & Plan:  1. Type 2 diabetes mellitus without complication, without long-term current use of insulin (HCC) -If HgbA1C WNL- Will stop Invokana - CMP14+EGFR - POCT glycosylated hemoglobin (Hb A1C)  2. Depression -List of [place] given to pt to schedule appt -Stress management  -Continue Wellbutrin and Cymbalta - CMP14+EGFR  3. GAD (generalized anxiety disorder) -List of [place] given to pt to schedule appt -Stress management  -Continue Wellbutrin and Cymbalta - CMP14+EGFR  4. Mixed incontinence urge and stress -Continue the [name] 50 mg daily - CMP14+EGFR - Ambulatory referral to Urology   Continue all meds Labs pending Health Maintenance reviewed Diet and exercise encouraged RTO 3 months   Evelina Dun, FNP

## 2015-12-28 LAB — CMP14+EGFR
ALBUMIN: 4.6 g/dL (ref 3.6–4.8)
ALK PHOS: 69 IU/L (ref 39–117)
ALT: 7 IU/L (ref 0–32)
AST: 13 IU/L (ref 0–40)
Albumin/Globulin Ratio: 2.2 (ref 1.1–2.5)
BILIRUBIN TOTAL: 0.2 mg/dL (ref 0.0–1.2)
BUN / CREAT RATIO: 16 (ref 11–26)
BUN: 13 mg/dL (ref 8–27)
CO2: 28 mmol/L (ref 18–29)
CREATININE: 0.83 mg/dL (ref 0.57–1.00)
Calcium: 9.5 mg/dL (ref 8.7–10.3)
Chloride: 96 mmol/L (ref 96–106)
GFR calc non Af Amer: 77 mL/min/{1.73_m2} (ref >59)
GFR, EST AFRICAN AMERICAN: 89 mL/min/{1.73_m2} (ref >59)
GLOBULIN, TOTAL: 2.1 g/dL (ref 1.5–4.5)
GLUCOSE: 79 mg/dL (ref 65–99)
Potassium: 4.2 mmol/L (ref 3.5–5.2)
SODIUM: 139 mmol/L (ref 134–144)
TOTAL PROTEIN: 6.7 g/dL (ref 6.0–8.5)

## 2015-12-30 ENCOUNTER — Ambulatory Visit (HOSPITAL_COMMUNITY): Payer: Self-pay

## 2015-12-30 ENCOUNTER — Other Ambulatory Visit: Payer: Self-pay | Admitting: Family

## 2016-01-02 ENCOUNTER — Ambulatory Visit (HOSPITAL_COMMUNITY)
Admission: RE | Admit: 2016-01-02 | Discharge: 2016-01-02 | Disposition: A | Discharge status: Home / Self Care | Payer: Medicaid Other | Source: Ambulatory Visit | Attending: Family | Admitting: Family

## 2016-01-02 DIAGNOSIS — Z1231 Encounter for screening mammogram for malignant neoplasm of breast: Secondary | ICD-10-CM | POA: Insufficient documentation

## 2016-01-06 ENCOUNTER — Other Ambulatory Visit: Payer: Self-pay | Admitting: Family

## 2016-01-06 DIAGNOSIS — IMO0002 Reserved for concepts with insufficient information to code with codable children: Secondary | ICD-10-CM

## 2016-01-06 DIAGNOSIS — R229 Localized swelling, mass and lump, unspecified: Principal | ICD-10-CM

## 2016-01-06 DIAGNOSIS — R928 Other abnormal and inconclusive findings on diagnostic imaging of breast: Secondary | ICD-10-CM

## 2016-01-14 ENCOUNTER — Ambulatory Visit (HOSPITAL_COMMUNITY)
Admission: RE | Admit: 2016-01-14 | Discharge: 2016-01-14 | Disposition: A | Discharge status: Home / Self Care | Payer: Medicaid Other | Source: Ambulatory Visit | Attending: Family | Admitting: Family

## 2016-01-14 ENCOUNTER — Ambulatory Visit (HOSPITAL_COMMUNITY)
Admission: RE | Admit: 2016-01-14 | Discharge: 2016-01-14 | Disposition: A | Discharge status: Home / Self Care | Payer: Medicaid Other | Source: Ambulatory Visit | Attending: Family Medicine | Admitting: Family Medicine

## 2016-01-14 ENCOUNTER — Other Ambulatory Visit: Payer: Self-pay | Admitting: Family

## 2016-01-14 DIAGNOSIS — N63 Unspecified lump in breast: Secondary | ICD-10-CM | POA: Diagnosis not present

## 2016-01-14 DIAGNOSIS — R928 Other abnormal and inconclusive findings on diagnostic imaging of breast: Secondary | ICD-10-CM

## 2016-01-14 DIAGNOSIS — R229 Localized swelling, mass and lump, unspecified: Principal | ICD-10-CM

## 2016-01-14 DIAGNOSIS — IMO0002 Reserved for concepts with insufficient information to code with codable children: Secondary | ICD-10-CM

## 2016-01-16 ENCOUNTER — Ambulatory Visit: Payer: Self-pay

## 2016-01-21 ENCOUNTER — Encounter (HOSPITAL_COMMUNITY): Payer: Self-pay

## 2016-01-21 ENCOUNTER — Ambulatory Visit (HOSPITAL_COMMUNITY)
Admission: RE | Admit: 2016-01-21 | Discharge: 2016-01-21 | Disposition: A | Discharge status: Home / Self Care | Payer: Medicaid Other | Source: Ambulatory Visit | Attending: *Deleted | Admitting: *Deleted

## 2016-01-21 ENCOUNTER — Other Ambulatory Visit (HOSPITAL_COMMUNITY): Payer: Self-pay | Admitting: *Deleted

## 2016-01-21 ENCOUNTER — Ambulatory Visit (HOSPITAL_COMMUNITY)
Admission: RE | Admit: 2016-01-21 | Discharge: 2016-01-21 | Disposition: A | Discharge status: Home / Self Care | Payer: Medicaid Other | Source: Ambulatory Visit | Attending: Family Medicine | Admitting: Family Medicine

## 2016-01-21 DIAGNOSIS — N63 Unspecified lump in breast: Secondary | ICD-10-CM | POA: Diagnosis not present

## 2016-01-21 DIAGNOSIS — Z853 Personal history of malignant neoplasm of breast: Secondary | ICD-10-CM | POA: Insufficient documentation

## 2016-01-21 DIAGNOSIS — IMO0002 Reserved for concepts with insufficient information to code with codable children: Secondary | ICD-10-CM

## 2016-01-21 DIAGNOSIS — N6310 Unspecified lump in the right breast, unspecified quadrant: Secondary | ICD-10-CM

## 2016-01-21 DIAGNOSIS — R229 Localized swelling, mass and lump, unspecified: Secondary | ICD-10-CM

## 2016-01-21 MED ORDER — LIDOCAINE HCL (PF) 1 % IJ SOLN
INTRAMUSCULAR | Status: AC
  Filled 2016-01-21: qty 10

## 2016-01-22 ENCOUNTER — Other Ambulatory Visit: Payer: Self-pay | Admitting: Family

## 2016-01-22 NOTE — Telephone Encounter (Signed)
Patient had breast biopsy yesterday and she is in a lot of pain. She wants to know if there is anything that she can take for the pain or is there something we can give her?

## 2016-01-22 NOTE — Telephone Encounter (Signed)
I see that she has in her history prescribed both Mobic and ibuprofen, both of those would be good places to start and taken for at least a few days to see if they're getting better. If she wants to she can take Tylenol on top of that. She can also ice the area to help.

## 2016-01-22 NOTE — Telephone Encounter (Signed)
Pt aware of Dr. Merita Norton recommendations.

## 2016-01-23 NOTE — Telephone Encounter (Signed)
Last vit D level was 43. Route to pool, if changing

## 2016-02-11 ENCOUNTER — Other Ambulatory Visit: Payer: Self-pay | Admitting: Family Medicine

## 2016-03-09 ENCOUNTER — Other Ambulatory Visit: Payer: Self-pay | Admitting: Family

## 2016-04-02 ENCOUNTER — Ambulatory Visit (INDEPENDENT_AMBULATORY_CARE_PROVIDER_SITE_OTHER): Payer: Medicaid Other

## 2016-04-02 ENCOUNTER — Ambulatory Visit (INDEPENDENT_AMBULATORY_CARE_PROVIDER_SITE_OTHER): Payer: Medicaid Other | Admitting: Family

## 2016-04-02 ENCOUNTER — Encounter: Payer: Self-pay | Admitting: Family

## 2016-04-02 VITALS — BP 90/53 | HR 87 | Temp 97.4°F | Ht 68.0 in | Wt 176.6 lb

## 2016-04-02 DIAGNOSIS — M79644 Pain in right finger(s): Secondary | ICD-10-CM

## 2016-04-02 DIAGNOSIS — R1310 Dysphagia, unspecified: Secondary | ICD-10-CM

## 2016-04-02 DIAGNOSIS — Z86010 Personal history of colonic polyps: Secondary | ICD-10-CM | POA: Diagnosis not present

## 2016-04-02 MED ORDER — SOLIFENACIN SUCCINATE 10 MG PO TABS: 10.0000 mg | ORAL_TABLET | Freq: Every day | ORAL | Status: DC

## 2016-04-02 NOTE — Progress Notes (Signed)
   Subjective:    Patient ID: [name], female    DOB: 04/30/1956, 60 y.o.   MRN: [id number]  HPI Pt presents to the office today for recurrent trouble swallowing. Pt states in the past she has had to had her "esophageal stretched" in the past and feels like she needs this done again. 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 PT states she gets choked on her food every time she eats and at points she vomits up her food because she can not get it down.   PT states she is having constant pain in her right ring finger. PT states she fell in January and landed on her right hand that is causing pain of 9 out 10. Pt states she takes motrin with mild relief.    Review of Systems  Constitutional: Negative.   HENT: Negative.   Eyes: Negative.   Respiratory: Negative.  Negative for shortness of breath.   Cardiovascular: Negative.  Negative for palpitations.  Gastrointestinal: Negative.   Endocrine: Negative.   Genitourinary: Negative.   Musculoskeletal: Negative.   Neurological: Negative.  Negative for headaches.  Hematological: Negative.   Psychiatric/Behavioral: Negative.   All other systems reviewed and are negative.      Objective:   Physical Exam  Constitutional: She is oriented to person, place, and time. She appears well-developed and well-nourished. No distress.  HENT:  Head: Normocephalic and atraumatic.  Right Ear: External ear normal.  Mouth/Throat: Oropharynx is clear and moist.  Eyes: Pupils are equal, round, and reactive to light.  Neck: Normal range of motion. Neck supple. No thyromegaly present.  Cardiovascular: Normal rate, regular rhythm, normal heart sounds and intact distal pulses.   No murmur heard. Pulmonary/Chest: Effort normal and breath sounds normal. No respiratory distress. She has no wheezes.  Abdominal: Soft. Bowel sounds are normal. She exhibits no distension. There is no tenderness.    Musculoskeletal: Normal range of motion. She exhibits edema (trace amt of swelling or right ring finger). She exhibits no tenderness.  Neurological: She is alert and oriented to person, place, and time. She has normal reflexes. No cranial nerve deficit.  Skin: Skin is warm and dry.  Psychiatric: She has a normal mood and affect. Her behavior is normal. Judgment and thought content normal.  Vitals reviewed.    BP 90/53 mmHg  Pulse 87  Temp(Src) 97.4 F (36.3 C) (Oral)  Ht 5\' 8"  (1.727 m)  Wt 176 lb 9.6 oz (80.105 kg)  BMI 26.86 kg/m2      Assessment & Plan:  1. Hx of adenomatous colonic polyps - Ambulatory referral to Gastroenterology  2. Dysphagia - Ambulatory referral to Gastroenterology  3. Finger pain, right - DG Finger Ring Right; Future   Continue all meds Labs pending Health Maintenance reviewed Diet and exercise encouraged RTO as needed and keep chronic follow up  Evelina Dun, FNP

## 2016-04-02 NOTE — Patient Instructions (Signed)

## 2016-04-07 ENCOUNTER — Encounter: Payer: Self-pay | Admitting: Internal Medicine

## 2016-04-07 ENCOUNTER — Telehealth: Payer: Self-pay | Admitting: Family

## 2016-04-07 ENCOUNTER — Other Ambulatory Visit: Payer: Self-pay | Admitting: Family

## 2016-04-07 DIAGNOSIS — Z86010 Personal history of colonic polyps: Secondary | ICD-10-CM

## 2016-04-07 DIAGNOSIS — R1310 Dysphagia, unspecified: Secondary | ICD-10-CM

## 2016-04-07 MED ORDER — IBUPROFEN 600 MG PO TABS: 600.0000 mg | ORAL_TABLET | Freq: Three times a day (TID) | ORAL | Status: DC | PRN

## 2016-04-07 NOTE — Telephone Encounter (Signed)
Prescription sent to pharmacy.

## 2016-04-07 NOTE — Telephone Encounter (Signed)
Pt aware Rx sent to pharmacy 

## 2016-04-13 ENCOUNTER — Other Ambulatory Visit: Payer: Self-pay | Admitting: Family

## 2016-04-27 ENCOUNTER — Other Ambulatory Visit: Payer: Self-pay

## 2016-04-27 ENCOUNTER — Encounter: Payer: Self-pay | Admitting: Gastroenterology

## 2016-04-27 ENCOUNTER — Ambulatory Visit (INDEPENDENT_AMBULATORY_CARE_PROVIDER_SITE_OTHER): Payer: Medicaid Other | Admitting: Gastroenterology

## 2016-04-27 VITALS — BP 123/74 | HR 80 | Temp 97.4°F | Ht 67.0 in | Wt 179.8 lb

## 2016-04-27 DIAGNOSIS — K219 Gastro-esophageal reflux disease without esophagitis: Secondary | ICD-10-CM | POA: Diagnosis not present

## 2016-04-27 DIAGNOSIS — R1314 Dysphagia, pharyngoesophageal phase: Secondary | ICD-10-CM | POA: Diagnosis not present

## 2016-04-27 DIAGNOSIS — J302 Other seasonal allergic rhinitis: Secondary | ICD-10-CM | POA: Insufficient documentation

## 2016-04-27 DIAGNOSIS — Z86010 Personal history of colonic polyps: Secondary | ICD-10-CM

## 2016-04-27 DIAGNOSIS — R1319 Other dysphagia: Secondary | ICD-10-CM

## 2016-04-27 DIAGNOSIS — R1310 Dysphagia, unspecified: Secondary | ICD-10-CM

## 2016-04-27 MED ORDER — PEG 3350-KCL-NA BICARB-NACL 420 G PO SOLR: 4000.0000 mL | ORAL | Status: DC

## 2016-04-27 MED ORDER — LINACLOTIDE 145 MCG PO CAPS: 145.0000 ug | ORAL_CAPSULE | Freq: Every day | ORAL | Status: DC

## 2016-04-27 NOTE — Patient Instructions (Signed)
1. Start Linzess 145 g daily before breakfast for constipation. Samples provided. Prescription sent to pharmacy. Please let me know if we need to adjust her dose. 2. Colonoscopy and upper endoscopy as scheduled. Please see separate instructions.

## 2016-04-27 NOTE — Progress Notes (Signed)
Primary Care Physician:  Evelina Dun, FNP  Primary Gastroenterologist:  Garfield Cornea, MD   Chief Complaint  Patient presents with  . set up TCS    HPI:  [name] is a 60 y.o. female here For further evaluation of dysphagia and history of adenomatous colon polyps. Patient was last seen around 2013. 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 Esophagus was dilated with a 54 [place] Maloney dilator. She had a 7 mm flat polyp just inside the anal verge, single diminutive polyp in the base of the cecum both removed, pathology of cecal polyp was tubular adenoma. Advised to have a follow-up colonoscopy in 5 years.  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 Status post resection and radiation therapy. In June of this year and will be 1 year since she completed radiation therapy. Still going to the effects of radiation with poor taste, diminished salivation, has to think to swallow. She feels like her swallowing issues are also related to her softest just like in the past. Feels like food sticking in the upper esophagus. Note 70 pound weight loss since her cancer diagnosis and treatment, has gained 10 pounds back. Heartburn controlled on omeprazole. Complains of new constipation for 1 year. Has to take a laxative to have a bowel movement. Feels bloated. No melena or bright red blood per rectum. Has the urge to have a bowel movement but difficulty passing stool. Sometimes utilizes suppositories. Can go weeks without a bowel movement if she doesn't take something.    Current Outpatient Prescriptions  Medication Sig Dispense Refill  . ACCU-CHEK AVIVA PLUS test strip USE TO TEST TWO TO THREE TIMES DAILY 100 each 2  . ACCU-CHEK SOFTCLIX LANCETS lancets USE TO TEST THREE TIMES DAILY 100 each 2  . ADVAIR DISKUS 250-50 MCG/DOSE AEPB INHALE 1 PUFF BY MOUTH TWICE DAILY 60 each 4  .  ALPRAZolam (XANAX) 1 MG tablet Take 1 tablet (1 mg total) by mouth 2 (two) times daily. 60 tablet 3  . buPROPion (WELLBUTRIN XL) 150 MG 24 hr tablet TAKE 1 TABLET BY MOUTH EVERY DAY 90 tablet 0  . DULoxetine (CYMBALTA) 60 MG capsule Take 1 capsule (60 mg total) by mouth daily. 90 capsule 3  . fluticasone (FLONASE) 50 MCG/ACT nasal spray INSTILL 2 SPRAYS INTO EACH NOSTRIL EVERY DAY 48 g 0  . ibuprofen (ADVIL,MOTRIN) 600 MG tablet Take 1 tablet (600 mg total) by mouth every 8 (eight) hours as needed. Patient takes only as needed - less than daily 30 tablet 3  . loratadine (CLARITIN) 10 MG tablet TAKE 1 TABLET BY MOUTH EVERY DAY 90 tablet 0  . magic mouthwash SOLN Take 5 mLs by mouth 4 (four) times daily as needed for mouth pain. 500 mL 6  . meloxicam (MOBIC) 15 MG tablet TAKE 1 TABLET BY MOUTH EVERY DAY 90 tablet 0  . metFORMIN (GLUCOPHAGE) 1000 MG tablet TAKE 1 TABLET BY MOUTH TWICE DAILY 180 tablet 0  . omeprazole (PRILOSEC) 40 MG capsule TAKE 1 CAPSULE BY MOUTH EVERY DAY 90 capsule 0  . pravastatin (PRAVACHOL) 20 MG tablet Take 1 tablet (20 mg total) by mouth daily. 90 tablet 3  . PROAIR HFA 108 (90 Base) MCG/ACT inhaler INHALE 2 PUFFS EVERY 4 TO 6 HOURS AS NEEDED 32 g 0  . sodium fluoride (FLUORISHIELD) 1.1 % GEL dental gel Instill one drop of gel per tooth space of  fluoride tray. Place over teeth for 5 minutes. Remove. Spit out excess. Repeat nightly. 120 mL 11  . solifenacin (VESICARE) 10 MG tablet Take 1 tablet (10 mg total) by mouth daily. 90 tablet 1  . tiotropium (SPIRIVA HANDIHALER) 18 MCG inhalation capsule Place 1 capsule (18 mcg total) into inhaler and inhale daily. 30 capsule 5  . traZODone (DESYREL) 150 MG tablet TAKE 1 TABLET BY MOUTH AT BEDTIME 90 tablet 0  . Vitamin D, Ergocalciferol, (DRISDOL) 50000 units CAPS capsule TAKE 1 CAPSULE BY MOUTH ONCE A WEEK 12 capsule 3   No current facility-administered medications for this visit.    Allergies as of 04/27/2016 - Review Complete  04/27/2016  Allergen Reaction Noted  . Penicillins Anaphylaxis and Swelling 03/08/2012  . Bee venom  03/08/2012    Past Medical History  Diagnosis Date  . Diabetes mellitus   . COPD (chronic obstructive pulmonary disease) ([place]) 2011  . GERD (gastroesophageal reflux disease)   . Hypertriglyceridemia     ?  Marland Kitchen Allergic rhinitis   . Breast cancer ([facility]) 2008    chemotherapy, XRT  . Reflux esophagitis 2008    egd  . Adenomatous colon polyp 01/2007  . Anxiety   . Bipolar disorder ([place])     [facility]  . Neoplasm of submandibular gland     left    Past Surgical History  Procedure Laterality Date  . Esophagogastroduodenoscopy  02/14/07    distal esophageal erosions   . Colonoscopy  02/14/07    engorged external and internal hemorrhoids with recent bleeding, diminutive polyp in the cecum, tubular adenoma, normal TI  . Mastectomy partial / lumpectomy  2008  . Tubal ligation    . Ectopic pregnancy surgery    . Vaginal hysterectomy  10/2007    with vaginal vault suspension  . Colonoscopy  03/30/2012    RMR: rectal and colonic polyps -removed as described above. Mellanosis coli  . 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     Family History  Problem Relation Age of Onset  . Colon cancer Paternal Grandmother 54    deceased  . Liver disease Neg Hx   . Diabetes Daughter 7    type 1  . Diabetes Mother   . Hypertension Mother   . Cancer Mother     unknown primary    Social History   Social History  . Marital Status: Married    Spouse Name: N/A  . Number of Children: 3  . Years of Education: N/A   Occupational History  . Not on file.   Social History Main Topics  . Smoking status: Former Smoker -- 1.50 packs/day for 25 years    Types: Cigarettes    Quit date: 01/09/1996  . Smokeless tobacco: Never Used  . Alcohol Use: No  . Drug Use: No  . Sexual Activity: Not on file   Other Topics Concern  . Not on  file   Social History Narrative      ROS:  General: Negative for anorexia, weight loss, fever, chills, fatigue, weakness. Eyes: Negative for vision changes.  ENT: see hpi CV: Negative for chest pain, angina, palpitations, dyspnea on exertion, peripheral edema.  Respiratory: Negative for dyspnea at rest, dyspnea on exertion, cough, sputum, wheezing.  GI: See history of present illness. GU:  Negative for dysuria, hematuria, urinary incontinence, urinary frequency, nocturnal urination.  MS: Negative for joint pain, low back pain.  Derm: Negative for rash or itching.  Neuro: Negative for weakness, abnormal sensation, seizure, frequent headaches, memory loss, confusion.  Psych: Negative for anxiety, depression, suicidal ideation, hallucinations.  Endo: Negative for unusual weight change.  Heme: Negative for bruising or bleeding. Allergy: Negative for rash or hives.    Physical Examination:  BP 123/74 mmHg  Pulse 80  Temp(Src) 97.4 F (36.3 C)  Ht 5\' 7"  (1.702 m)  Wt 179 lb 12.8 oz (81.557 kg)  BMI 28.15 kg/m2   General: Well-nourished, well-developed in no acute distress.  Head: Normocephalic, atraumatic.   Eyes: Conjunctiva pink, no icterus. Mouth: Oropharyngeal mucosa moist and pink , no lesions erythema or exudate. Neck: Supple without thyromegaly, masses, or lymphadenopathy.  Lungs: Clear to auscultation bilaterally.  Heart: Regular rate and rhythm, no murmurs rubs or gallops.  Abdomen: Bowel sounds are normal, nontender, nondistended, no hepatosplenomegaly or masses, no abdominal bruits or    hernia , no rebound or guarding.   Rectal: not performed Extremities: No lower extremity edema. No clubbing or deformities.  Neuro: Alert and oriented x 4 , grossly normal neurologically.  Skin: Warm and dry, no rash or jaundice.   Psych: Alert and cooperative, normal mood and affect.  Labs: Lab Results  Component Value Date   CREATININE 0.83 12/27/2015   BUN 13 12/27/2015    NA 139 12/27/2015   K 4.2 12/27/2015   CL 96 12/27/2015   CO2 28 12/27/2015   Lab Results  Component Value Date   ALT 7 12/27/2015   AST 13 12/27/2015   ALKPHOS 69 12/27/2015   BILITOT 0.2 12/27/2015     Imaging Studies: Dg Finger Ring Right  04/03/2016  CLINICAL DATA:  Injured finger in January. Continued pain and swelling. EXAM: RIGHT RING FINGER 2+V COMPARISON:  None. FINDINGS: No acute bony abnormality. Specifically, no fracture, subluxation, or dislocation. Soft tissues are intact. IMPRESSION: No acute bony abnormality. Electronically Signed   By: Rolm Baptise M.D.   On: 04/03/2016 08:56

## 2016-04-29 NOTE — Progress Notes (Signed)
cc'ed to pcp °

## 2016-04-29 NOTE — Assessment & Plan Note (Signed)
Patient with history of adenomatous colon polyps, was due for surveillance colonoscopy next year. She presents for change in bowel habits, new onset constipation for 1 year requiring laxative use for bowel movement. Plan on pursuing colonoscopy at this time. Plan for deep sedation in the OR given polypharmacy.  I have discussed the risks, alternatives, benefits with regards to but not limited to the risk of reaction to medication, bleeding, infection, perforation and the patient is agreeable to proceed. Written consent to be obtained.  Started Linzess 145 g daily for constipation.

## 2016-04-29 NOTE — Assessment & Plan Note (Signed)
60 year old female with left submandibular cancer status post resection and radiation therapy last year who presents with complaints of dysphagia. Continues to have side effects related to radiation including poor taste, decreased salivation. Mentally has to think about swallowing per patient. Feels like solid foods are more difficult to swallow with some sticking sensation in the upper chest region similar to dysphagia in the past requiring esophageal dilation. Offered upper endoscopy with esophageal dilation in the near future. Given polypharmacy plan on deep sedation in the OR.  I have discussed the risks, alternatives, benefits with regards to but not limited to the risk of reaction to medication, bleeding, infection, perforation and the patient is agreeable to proceed. Written consent to be obtained.

## 2016-05-13 ENCOUNTER — Other Ambulatory Visit: Payer: Self-pay | Admitting: Family

## 2016-05-13 NOTE — Telephone Encounter (Signed)
Last seen 04/02/16  [facility]

## 2016-05-19 NOTE — Patient Instructions (Addendum)
MARCELLENE RAGGS  05/19/2016     @PREFPERIOPPHARMACY @   Your procedure is scheduled on 05/25/2016.  Report to [facility] at 12:30 P.M.  Call this number if you have problems the morning of surgery:  [phone]   Remember:  Do not eat food or drink liquids after midnight.  Take these medicines the morning of surgery with A SIP OF WATER Advair, Xanax, Wellbutrin, Cymbalta, Flonase, Linzess, Spiriva, Claritin, [place]ic mouthwash, Mobic, Mybetriq, Prilosec, Pro Air, Vesicare   DO NOT TAKE DIABETIC MEDICATION DAY OF PROCEDURE   Do not wear jewelry, make-up or nail polish.  Do not wear lotions, powders, or perfumes.  You may wear deodorant.  Do not shave 48 hours prior to surgery.  Men may shave face and neck.  Do not bring valuables to the hospital.  [facility] is not responsible for any belongings or valuables.  Contacts, dentures or bridgework may not be worn into surgery.  Leave your suitcase in the car.  After surgery it may be brought to your room.  For patients admitted to the hospital, discharge time will be determined by your treatment team.  Patients discharged the day of surgery will not be allowed to drive home.    Please read over the following fact sheets that you were given. Anesthesia Post-op Instructions     PATIENT INSTRUCTIONS POST-ANESTHESIA  IMMEDIATELY FOLLOWING SURGERY:  Do not drive or operate machinery for the first twenty four hours after surgery.  Do not make any important decisions for twenty four hours after surgery or while taking narcotic pain medications or sedatives.  If you develop intractable nausea and vomiting or a severe headache please notify your doctor immediately.  FOLLOW-UP:  Please make an appointment with your surgeon as instructed. You do not need to follow up with anesthesia unless specifically instructed to do so.  WOUND CARE INSTRUCTIONS (if applicable):  Keep a dry clean dressing on the anesthesia/puncture wound site if there is  drainage.  Once the wound has quit draining you may leave it open to air.  Generally you should leave the bandage intact for twenty four hours unless there is drainage.  If the epidural site drains for more than 36-48 hours please call the anesthesia department.  QUESTIONS?:  Please feel free to call your physician or the hospital operator if you have any questions, and they will be happy to assist you.      Colonoscopy A colonoscopy is an exam to look at the entire large intestine (colon). This exam can help find problems such as tumors, polyps, inflammation, and areas of bleeding. The exam takes about 1 hour.  LET [facility] CARE PROVIDER KNOW ABOUT:   Any allergies you have.  All medicines you are taking, including vitamins, herbs, eye drops, creams, and over-the-counter medicines.  Previous problems you or members of your family have had with the use of anesthetics.  Any blood disorders you have.  Previous surgeries you have had.  Medical conditions you have. RISKS AND COMPLICATIONS  Generally, this is a safe procedure. However, as with any procedure, complications can occur. Possible complications include:  Bleeding.  Tearing or rupture of the colon wall.  Reaction to medicines given during the exam.  Infection (rare). BEFORE THE PROCEDURE   Ask your health care provider about changing or stopping your regular medicines.  You may be prescribed an oral bowel prep. This involves drinking a large amount of medicated liquid, starting the day before your procedure. The liquid  will cause you to have multiple loose stools until your stool is almost clear or light green. This cleans out your colon in preparation for the procedure.  Do not eat or drink anything else once you have started the bowel prep, unless your health care provider tells you it is safe to do so.  Arrange for someone to drive you home after the procedure. PROCEDURE   You will be given medicine to help you  relax (sedative).  You will lie on your side with your knees bent.  A long, flexible tube with a light and camera on the end (colonoscope) will be inserted through the rectum and into the colon. The camera sends video back to a computer screen as it moves through the colon. The colonoscope also releases carbon dioxide gas to inflate the colon. This helps your health care provider see the area better.  During the exam, your health care provider may take a small tissue sample (biopsy) to be examined under a microscope if any abnormalities are found.  The exam is finished when the entire colon has been viewed. AFTER THE PROCEDURE   Do not drive for 24 hours after the exam.  You may have a small amount of blood in your stool.  You may pass moderate amounts of gas and have mild abdominal cramping or bloating. This is caused by the gas used to inflate your colon during the exam.  Ask when your test results will be ready and how you will get your results. Make sure you get your test results.   This information is not intended to replace advice given to you by your health care provider. Make sure you discuss any questions you have with your health care provider.   Document Released: 12/04/2000 Document Revised: 09/27/2013 Document Reviewed: 08/14/2013 Elsevier Interactive Patient Education 2016 [employer]. Esophagogastroduodenoscopy Esophagogastroduodenoscopy (EGD) is a procedure that is used to examine the lining of the esophagus, stomach, and first part of the small intestine (duodenum). A long, flexible, lighted tube with a camera attached (endoscope) is inserted down the throat to view these organs. This procedure is done to detect problems or abnormalities, such as inflammation, bleeding, ulcers, or growths, in order to treat them. The procedure lasts 5-20 minutes. It is usually an outpatient procedure, but it may need to be performed in a hospital in emergency cases. LET [facility] CARE  PROVIDER KNOW ABOUT:  Any allergies you have.  All medicines you are taking, including vitamins, herbs, eye drops, creams, and over-the-counter medicines.  Previous problems you or members of your family have had with the use of anesthetics.  Any blood disorders you have.  Previous surgeries you have had.  Medical conditions you have. RISKS AND COMPLICATIONS Generally, this is a safe procedure. However, problems can occur and include:  Infection.  Bleeding.  Tearing (perforation) of the esophagus, stomach, or duodenum.  Difficulty breathing or not being able to breathe.  Excessive sweating.  Spasms of the larynx.  Slowed heartbeat.  Low blood pressure. BEFORE THE PROCEDURE  Do not eat or drink anything after midnight on the night before the procedure or as directed by your health care provider.  Do not take your regular medicines before the procedure if your health care provider asks you not to. Ask your health care provider about changing or stopping those medicines.  If you wear dentures, be prepared to remove them before the procedure.  Arrange for someone to drive you home after the procedure. PROCEDURE  A numbing medicine (local anesthetic) may be sprayed in your throat for comfort and to stop you from gagging or coughing.  You will have an IV tube inserted in a vein in your hand or arm. You will receive medicines and fluids through this tube.  You will be given a medicine to relax you (sedative).  A pain reliever will be given through the IV tube.  A mouth guard may be placed in your mouth to protect your teeth and to keep you from biting on the endoscope.  You will be asked to lie on your left side.  The endoscope will be inserted down your throat and into your esophagus, stomach, and duodenum.  Air will be put through the endoscope to allow your health care provider to clearly view the lining of your esophagus.  The lining of your esophagus, stomach,  and duodenum will be examined. During the exam, your health care provider may:  Remove tissue to be examined under a microscope (biopsy) for inflammation, infection, or other medical problems.  Remove growths.  Remove objects (foreign bodies) that are stuck.  Treat any bleeding with medicines or other devices that stop tissues from bleeding (hot cautery, clipping devices).  Widen (dilate) or stretch narrowed areas of your esophagus and stomach.  The endoscope will be withdrawn. AFTER THE PROCEDURE  You will be taken to a recovery area for observation. Your blood pressure, heart rate, breathing rate, and blood oxygen level will be monitored often until the medicines you were given have worn off.  Do not eat or drink anything until the numbing medicine has worn off and your gag reflex has returned. You may choke.  Your health care provider should be able to discuss his or her findings with you. It will take longer to discuss the test results if any biopsies were taken.   This information is not intended to replace advice given to you by your health care provider. Make sure you discuss any questions you have with your health care provider.   Document Released: 04/09/2005 Document Revised: 12/28/2014 Document Reviewed: 11/09/2012 Elsevier Interactive Patient Education 2016 Elsevier Inc. Esophageal Dilatation Esophageal dilatation is a procedure to open a blocked or narrowed part of the esophagus. The esophagus is the long tube in your throat that carries food and liquid from your mouth to your stomach. The procedure is also called esophageal dilation.  You may need this procedure if you have a buildup of scar tissue in your esophagus that makes it difficult, painful, or even impossible to swallow. This can be caused by gastroesophageal reflux disease (GERD). In rare cases, people need this procedure because they have cancer of the esophagus or a problem with the way food moves through the  esophagus. Sometimes you may need to have another dilatation to enlarge the opening of the esophagus gradually. LET [facility] CARE PROVIDER KNOW ABOUT:   Any allergies you have.  All medicines you are taking, including vitamins, herbs, eye drops, creams, and over-the-counter medicines.  Previous problems you or members of your family have had with the use of anesthetics.  Any blood disorders you have.  Previous surgeries you have had.  Medical conditions you have.  Any antibiotic medicines you are required to take before dental procedures. RISKS AND COMPLICATIONS Generally, this is a safe procedure. However, problems can occur and include:  Bleeding from a tear in the lining of the esophagus.  A hole (perforation) in the esophagus. BEFORE THE PROCEDURE  Do not eat  or drink anything after midnight on the night before the procedure or as directed by your health care provider.  Ask your health care provider about changing or stopping your regular medicines. This is especially important if you are taking diabetes medicines or blood thinners.  Plan to have someone take you home after the procedure. PROCEDURE   You will be given a medicine that makes you relaxed and sleepy (sedative).  A medicine may be sprayed or gargled to numb the back of the throat.  Your health care provider can use various instruments to do an esophageal dilatation. During the procedure, the instrument used will be placed in your mouth and passed down into your esophagus. Options include:  Simple dilators. This instrument is carefully placed in the esophagus to stretch it.  Guided wire bougies. In this method, a flexible tube (endoscope) is used to insert a wire into the esophagus. The dilator is passed over this wire to enlarge the esophagus. Then the wire is removed.  Balloon dilators. An endoscope with a small balloon at the end is passed down into the esophagus. Inflating the balloon gently stretches  the esophagus and opens it up. AFTER THE PROCEDURE  Your blood pressure, heart rate, breathing rate, and blood oxygen level will be monitored often until the medicines you were given have worn off.  Your throat may feel slightly sore and will probably still feel numb. This will improve slowly over time.  You will not be allowed to eat or drink until the throat numbness has resolved.  If this is a same-day procedure, you may be allowed to go home once you have been able to drink, urinate, and sit on the edge of the bed without nausea or dizziness.  If this is a same-day procedure, you should have a friend or family member with you for the next 24 hours after the procedure.   This information is not intended to replace advice given to you by your health care provider. Make sure you discuss any questions you have with your health care provider.   Document Released: 01/28/2006 Document Revised: 12/28/2014 Document Reviewed: 04/18/2014 Elsevier Interactive Patient Education [employer].

## 2016-05-20 ENCOUNTER — Encounter (HOSPITAL_COMMUNITY)
Admission: RE | Admit: 2016-05-20 | Discharge: 2016-05-20 | Disposition: A | Discharge status: Home / Self Care | Payer: Medicaid Other | Source: Ambulatory Visit | Attending: Internal Medicine | Admitting: Internal Medicine

## 2016-05-20 NOTE — Patient Instructions (Addendum)
[name]  05/20/2016     @PREFPERIOPPHARMACY @   Your procedure is scheduled on 05/25/2016.  Report to [facility] at 6:45 P.M.  Call this number if you have problems the morning of surgery:  [phone]   Remember:  Do not eat food or drink liquids after midnight.  Take these medicines the morning of surgery with A SIP OF WATER Magic MW if needed, Mobic, [name], Prilosec, Pro Air inhaler, Advair, Xanax, Wellbutrin, Xanax  Cymbalta, Flonase, Vesicare, Spiriva, Claritin   DO NOT TAKE DIABETIC MEDICATION DAY OF PROCEDURE   Do not wear jewelry, make-up or nail polish.  Do not wear lotions, powders, or perfumes.  You may wear deodorant.  Do not shave 48 hours prior to surgery.  Men may shave face and neck.  Do not bring valuables to the hospital.  [facility] is not responsible for any belongings or valuables.  Contacts, dentures or bridgework may not be worn into surgery.  Leave your suitcase in the car.  After surgery it may be brought to your room.  For patients admitted to the hospital, discharge time will be determined by your treatment team.  Patients discharged the day of surgery will not be allowed to drive home.    Please read over the following fact sheets that you were given. Anesthesia Post-op Instructions     PATIENT INSTRUCTIONS POST-ANESTHESIA  IMMEDIATELY FOLLOWING SURGERY:  Do not drive or operate machinery for the first twenty four hours after surgery.  Do not make any important decisions for twenty four hours after surgery or while taking narcotic pain medications or sedatives.  If you develop intractable nausea and vomiting or a severe headache please notify your doctor immediately.  FOLLOW-UP:  Please make an appointment with your surgeon as instructed. You do not need to follow up with anesthesia unless specifically instructed to do so.  WOUND CARE INSTRUCTIONS (if applicable):  Keep a dry clean dressing on the anesthesia/puncture wound site if  there is drainage.  Once the wound has quit draining you may leave it open to air.  Generally you should leave the bandage intact for twenty four hours unless there is drainage.  If the epidural site drains for more than 36-48 hours please call the anesthesia department.  QUESTIONS?:  Please feel free to call your physician or the hospital operator if you have any questions, and they will be happy to assist you.      Esophageal Dilatation Esophageal dilatation is a procedure to open a blocked or narrowed part of the esophagus. The esophagus is the long tube in your throat that carries food and liquid from your mouth to your stomach. The procedure is also called esophageal dilation.  You may need this procedure if you have a buildup of scar tissue in your esophagus that makes it difficult, painful, or even impossible to swallow. This can be caused by gastroesophageal reflux disease (GERD). In rare cases, people need this procedure because they have cancer of the esophagus or a problem with the way food moves through the esophagus. Sometimes you may need to have another dilatation to enlarge the opening of the esophagus gradually. LET [facility] CARE PROVIDER KNOW ABOUT:   Any allergies you have.  All medicines you are taking, including vitamins, herbs, eye drops, creams, and over-the-counter medicines.  Previous problems you or members of your family have had with the use of anesthetics.  Any blood disorders you have.  Previous surgeries you have had.  Medical conditions you have.  Any antibiotic medicines you are required to take before dental procedures. RISKS AND COMPLICATIONS Generally, this is a safe procedure. However, problems can occur and include:  Bleeding from a tear in the lining of the esophagus.  A hole (perforation) in the esophagus. BEFORE THE PROCEDURE  Do not eat or drink anything after midnight on the night before the procedure or as directed by your health care  provider.  Ask your health care provider about changing or stopping your regular medicines. This is especially important if you are taking diabetes medicines or blood thinners.  Plan to have someone take you home after the procedure. PROCEDURE   You will be given a medicine that makes you relaxed and sleepy (sedative).  A medicine may be sprayed or gargled to numb the back of the throat.  Your health care provider can use various instruments to do an esophageal dilatation. During the procedure, the instrument used will be placed in your mouth and passed down into your esophagus. Options include:  Simple dilators. This instrument is carefully placed in the esophagus to stretch it.  Guided wire bougies. In this method, a flexible tube (endoscope) is used to insert a wire into the esophagus. The dilator is passed over this wire to enlarge the esophagus. Then the wire is removed.  Balloon dilators. An endoscope with a small balloon at the end is passed down into the esophagus. Inflating the balloon gently stretches the esophagus and opens it up. AFTER THE PROCEDURE  Your blood pressure, heart rate, breathing rate, and blood oxygen level will be monitored often until the medicines you were given have worn off.  Your throat may feel slightly sore and will probably still feel numb. This will improve slowly over time.  You will not be allowed to eat or drink until the throat numbness has resolved.  If this is a same-day procedure, you may be allowed to go home once you have been able to drink, urinate, and sit on the edge of the bed without nausea or dizziness.  If this is a same-day procedure, you should have a friend or family member with you for the next 24 hours after the procedure.   This information is not intended to replace advice given to you by your health care provider. Make sure you discuss any questions you have with your health care provider.   Document Released: 01/28/2006  Document Revised: 12/28/2014 Document Reviewed: 04/18/2014 Elsevier Interactive Patient Education 2016 [employer]. Esophagogastroduodenoscopy Esophagogastroduodenoscopy (EGD) is a procedure that is used to examine the lining of the esophagus, stomach, and first part of the small intestine (duodenum). A long, flexible, lighted tube with a camera attached (endoscope) is inserted down the throat to view these organs. This procedure is done to detect problems or abnormalities, such as inflammation, bleeding, ulcers, or growths, in order to treat them. The procedure lasts 5-20 minutes. It is usually an outpatient procedure, but it may need to be performed in a hospital in emergency cases. LET [facility] CARE PROVIDER KNOW ABOUT:  Any allergies you have.  All medicines you are taking, including vitamins, herbs, eye drops, creams, and over-the-counter medicines.  Previous problems you or members of your family have had with the use of anesthetics.  Any blood disorders you have.  Previous surgeries you have had.  Medical conditions you have. RISKS AND COMPLICATIONS Generally, this is a safe procedure. However, problems can occur and include:  Infection.  Bleeding.  Tearing (perforation)  of the esophagus, stomach, or duodenum.  Difficulty breathing or not being able to breathe.  Excessive sweating.  Spasms of the larynx.  Slowed heartbeat.  Low blood pressure. BEFORE THE PROCEDURE  Do not eat or drink anything after midnight on the night before the procedure or as directed by your health care provider.  Do not take your regular medicines before the procedure if your health care provider asks you not to. Ask your health care provider about changing or stopping those medicines.  If you wear dentures, be prepared to remove them before the procedure.  Arrange for someone to drive you home after the procedure. PROCEDURE  A numbing medicine (local anesthetic) may be sprayed in your  throat for comfort and to stop you from gagging or coughing.  You will have an IV tube inserted in a vein in your hand or arm. You will receive medicines and fluids through this tube.  You will be given a medicine to relax you (sedative).  A pain reliever will be given through the IV tube.  A mouth guard may be placed in your mouth to protect your teeth and to keep you from biting on the endoscope.  You will be asked to lie on your left side.  The endoscope will be inserted down your throat and into your esophagus, stomach, and duodenum.  Air will be put through the endoscope to allow your health care provider to clearly view the lining of your esophagus.  The lining of your esophagus, stomach, and duodenum will be examined. During the exam, your health care provider may:  Remove tissue to be examined under a microscope (biopsy) for inflammation, infection, or other medical problems.  Remove growths.  Remove objects (foreign bodies) that are stuck.  Treat any bleeding with medicines or other devices that stop tissues from bleeding (hot cautery, clipping devices).  Widen (dilate) or stretch narrowed areas of your esophagus and stomach.  The endoscope will be withdrawn. AFTER THE PROCEDURE  You will be taken to a recovery area for observation. Your blood pressure, heart rate, breathing rate, and blood oxygen level will be monitored often until the medicines you were given have worn off.  Do not eat or drink anything until the numbing medicine has worn off and your gag reflex has returned. You may choke.  Your health care provider should be able to discuss his or her findings with you. It will take longer to discuss the test results if any biopsies were taken.   This information is not intended to replace advice given to you by your health care provider. Make sure you discuss any questions you have with your health care provider.   Document Released: 04/09/2005 Document Revised:  12/28/2014 Document Reviewed: 11/09/2012 Elsevier Interactive Patient Education 2016 [employer]. Colonoscopy A colonoscopy is an exam to look at the entire large intestine (colon). This exam can help find problems such as tumors, polyps, inflammation, and areas of bleeding. The exam takes about 1 hour.  LET [facility] CARE PROVIDER KNOW ABOUT:   Any allergies you have.  All medicines you are taking, including vitamins, herbs, eye drops, creams, and over-the-counter medicines.  Previous problems you or members of your family have had with the use of anesthetics.  Any blood disorders you have.  Previous surgeries you have had.  Medical conditions you have. RISKS AND COMPLICATIONS  Generally, this is a safe procedure. However, as with any procedure, complications can occur. Possible complications include:  Bleeding.  Tearing  or rupture of the colon wall.  Reaction to medicines given during the exam.  Infection (rare). BEFORE THE PROCEDURE   Ask your health care provider about changing or stopping your regular medicines.  You may be prescribed an oral bowel prep. This involves drinking a large amount of medicated liquid, starting the day before your procedure. The liquid will cause you to have multiple loose stools until your stool is almost clear or light green. This cleans out your colon in preparation for the procedure.  Do not eat or drink anything else once you have started the bowel prep, unless your health care provider tells you it is safe to do so.  Arrange for someone to drive you home after the procedure. PROCEDURE   You will be given medicine to help you relax (sedative).  You will lie on your side with your knees bent.  A long, flexible tube with a light and camera on the end (colonoscope) will be inserted through the rectum and into the colon. The camera sends video back to a computer screen as it moves through the colon. The colonoscope also releases carbon  dioxide gas to inflate the colon. This helps your health care provider see the area better.  During the exam, your health care provider may take a small tissue sample (biopsy) to be examined under a microscope if any abnormalities are found.  The exam is finished when the entire colon has been viewed. AFTER THE PROCEDURE   Do not drive for 24 hours after the exam.  You may have a small amount of blood in your stool.  You may pass moderate amounts of gas and have mild abdominal cramping or bloating. This is caused by the gas used to inflate your colon during the exam.  Ask when your test results will be ready and how you will get your results. Make sure you get your test results.   This information is not intended to replace advice given to you by your health care provider. Make sure you discuss any questions you have with your health care provider.   Document Released: 12/04/2000 Document Revised: 09/27/2013 Document Reviewed: 08/14/2013 Elsevier Interactive Patient Education [employer].

## 2016-05-21 ENCOUNTER — Telehealth: Payer: Self-pay | Admitting: Internal Medicine

## 2016-05-21 MED ORDER — LINACLOTIDE 290 MCG PO CAPS: 290.0000 ug | ORAL_CAPSULE | Freq: Every day | ORAL | Status: DC

## 2016-05-21 NOTE — Telephone Encounter (Signed)
rx sent for linzess 279mcg daily.

## 2016-05-21 NOTE — Addendum Note (Signed)
Addended by: Mahala Menghini on: 05/21/2016 03:32 PM   Modules accepted: Orders

## 2016-05-21 NOTE — Telephone Encounter (Signed)
Routing to [facility] 

## 2016-05-21 NOTE — Telephone Encounter (Signed)
Pt called to say that the Linzess she is taking is not helping and was told to call us and we would increase the dosage.

## 2016-05-22 ENCOUNTER — Encounter (HOSPITAL_COMMUNITY): Payer: Self-pay

## 2016-05-22 ENCOUNTER — Encounter (HOSPITAL_COMMUNITY)
Admission: RE | Admit: 2016-05-22 | Discharge: 2016-05-22 | Disposition: A | Discharge status: Home / Self Care | Payer: Medicaid Other | Source: Ambulatory Visit | Attending: Internal Medicine | Admitting: Internal Medicine

## 2016-05-22 ENCOUNTER — Other Ambulatory Visit: Payer: Self-pay

## 2016-05-22 DIAGNOSIS — Z01810 Encounter for preprocedural cardiovascular examination: Secondary | ICD-10-CM | POA: Diagnosis present

## 2016-05-22 DIAGNOSIS — Z86010 Personal history of colonic polyps: Secondary | ICD-10-CM | POA: Insufficient documentation

## 2016-05-22 DIAGNOSIS — R1310 Dysphagia, unspecified: Secondary | ICD-10-CM | POA: Insufficient documentation

## 2016-05-22 DIAGNOSIS — Z01812 Encounter for preprocedural laboratory examination: Secondary | ICD-10-CM | POA: Insufficient documentation

## 2016-05-22 HISTORY — DX: Headache: R51

## 2016-05-22 HISTORY — DX: Unspecified osteoarthritis, unspecified site: M19.90

## 2016-05-22 HISTORY — DX: Personal history of other diseases of the digestive system: Z87.19

## 2016-05-22 HISTORY — DX: Anemia, unspecified: D64.9

## 2016-05-22 HISTORY — DX: Headache, unspecified: R51.9

## 2016-05-22 LAB — CBC
HCT: 35.9 % — ABNORMAL LOW (ref 36.0–46.0)
Hemoglobin: 11.9 g/dL — ABNORMAL LOW (ref 12.0–15.0)
MCH: 30 pg (ref 26.0–34.0)
MCHC: 33.1 g/dL (ref 30.0–36.0)
MCV: 90.4 fL (ref 78.0–100.0)
Platelets: 250 10*3/uL (ref 150–400)
RBC: 3.97 MIL/uL (ref 3.87–5.11)
RDW: 12.8 % (ref 11.5–15.5)
WBC: 2.9 10*3/uL — ABNORMAL LOW (ref 4.0–10.5)

## 2016-05-22 LAB — BASIC METABOLIC PANEL
Anion gap: 6 (ref 5–15)
BUN: 12 mg/dL (ref 6–20)
CALCIUM: 9.4 mg/dL (ref 8.9–10.3)
CO2: 30 mmol/L (ref 22–32)
Chloride: 99 mmol/L — ABNORMAL LOW (ref 101–111)
Creatinine, Ser: 0.91 mg/dL (ref 0.44–1.00)
GFR calc Af Amer: >60 mL/min (ref >60)
GLUCOSE: 115 mg/dL — AB (ref 65–99)
Potassium: 4.4 mmol/L (ref 3.5–5.1)
Sodium: 135 mmol/L (ref 135–145)

## 2016-05-25 ENCOUNTER — Encounter (HOSPITAL_COMMUNITY): Payer: Self-pay | Admitting: *Deleted

## 2016-05-25 ENCOUNTER — Encounter (HOSPITAL_COMMUNITY)
Admission: RE | Disposition: A | Discharge status: Home / Self Care | Payer: Self-pay | Source: Ambulatory Visit | Attending: Internal Medicine

## 2016-05-25 ENCOUNTER — Ambulatory Visit (HOSPITAL_COMMUNITY)
Admission: RE | Admit: 2016-05-25 | Discharge: 2016-05-25 | Disposition: A | Discharge status: Home / Self Care | Payer: Medicaid Other | Source: Ambulatory Visit | Attending: Internal Medicine | Admitting: Internal Medicine

## 2016-05-25 ENCOUNTER — Ambulatory Visit (HOSPITAL_COMMUNITY): Payer: Medicaid Other | Admitting: Anesthesiology

## 2016-05-25 DIAGNOSIS — K222 Esophageal obstruction: Secondary | ICD-10-CM | POA: Diagnosis not present

## 2016-05-25 DIAGNOSIS — Z87891 Personal history of nicotine dependence: Secondary | ICD-10-CM | POA: Diagnosis not present

## 2016-05-25 DIAGNOSIS — E119 Type 2 diabetes mellitus without complications: Secondary | ICD-10-CM | POA: Diagnosis not present

## 2016-05-25 DIAGNOSIS — Z853 Personal history of malignant neoplasm of breast: Secondary | ICD-10-CM | POA: Diagnosis not present

## 2016-05-25 DIAGNOSIS — Z7984 Long term (current) use of oral hypoglycemic drugs: Secondary | ICD-10-CM | POA: Insufficient documentation

## 2016-05-25 DIAGNOSIS — Z860100 Personal history of colon polyps, unspecified: Secondary | ICD-10-CM | POA: Insufficient documentation

## 2016-05-25 DIAGNOSIS — Z79899 Other long term (current) drug therapy: Secondary | ICD-10-CM | POA: Insufficient documentation

## 2016-05-25 DIAGNOSIS — F419 Anxiety disorder, unspecified: Secondary | ICD-10-CM | POA: Insufficient documentation

## 2016-05-25 DIAGNOSIS — R12 Heartburn: Secondary | ICD-10-CM | POA: Diagnosis not present

## 2016-05-25 DIAGNOSIS — J449 Chronic obstructive pulmonary disease, unspecified: Secondary | ICD-10-CM | POA: Diagnosis not present

## 2016-05-25 DIAGNOSIS — K5900 Constipation, unspecified: Secondary | ICD-10-CM | POA: Insufficient documentation

## 2016-05-25 DIAGNOSIS — K219 Gastro-esophageal reflux disease without esophagitis: Secondary | ICD-10-CM | POA: Insufficient documentation

## 2016-05-25 DIAGNOSIS — R1310 Dysphagia, unspecified: Secondary | ICD-10-CM | POA: Insufficient documentation

## 2016-05-25 DIAGNOSIS — Z1211 Encounter for screening for malignant neoplasm of colon: Secondary | ICD-10-CM | POA: Diagnosis not present

## 2016-05-25 DIAGNOSIS — K449 Diaphragmatic hernia without obstruction or gangrene: Secondary | ICD-10-CM | POA: Diagnosis not present

## 2016-05-25 DIAGNOSIS — F319 Bipolar disorder, unspecified: Secondary | ICD-10-CM | POA: Diagnosis not present

## 2016-05-25 HISTORY — PX: ESOPHAGOGASTRODUODENOSCOPY (EGD) WITH PROPOFOL: SHX5813

## 2016-05-25 HISTORY — PX: MALONEY DILATION: SHX5535

## 2016-05-25 HISTORY — PX: COLONOSCOPY WITH PROPOFOL: SHX5780

## 2016-05-25 LAB — GLUCOSE, CAPILLARY
GLUCOSE-CAPILLARY: 102 mg/dL — AB (ref 65–99)
Glucose-Capillary: 102 mg/dL — ABNORMAL HIGH (ref 65–99)

## 2016-05-25 SURGERY — COLONOSCOPY WITH PROPOFOL
Anesthesia: Monitor Anesthesia Care

## 2016-05-25 MED ORDER — MIDAZOLAM HCL 5 MG/5ML IJ SOLN
INTRAMUSCULAR | Status: DC | PRN
  Administered 2016-05-25: 2 mg via INTRAVENOUS

## 2016-05-25 MED ORDER — ONDANSETRON HCL 4 MG/2ML IJ SOLN
INTRAMUSCULAR | Status: AC
  Filled 2016-05-25: qty 2

## 2016-05-25 MED ORDER — FENTANYL CITRATE (PF) 100 MCG/2ML IJ SOLN
25.0000 ug | INTRAMUSCULAR | Status: AC
  Administered 2016-05-25 (×2): 25 ug via INTRAVENOUS

## 2016-05-25 MED ORDER — ONDANSETRON HCL 4 MG/2ML IJ SOLN: 4.0000 mg | Freq: Once | INTRAMUSCULAR | Status: DC | PRN

## 2016-05-25 MED ORDER — ONDANSETRON HCL 4 MG/2ML IJ SOLN
4.0000 mg | Freq: Once | INTRAMUSCULAR | Status: AC
  Administered 2016-05-25: 4 mg via INTRAVENOUS

## 2016-05-25 MED ORDER — MIDAZOLAM HCL 2 MG/2ML IJ SOLN
1.0000 mg | INTRAMUSCULAR | Status: DC | PRN
  Administered 2016-05-25: 2 mg via INTRAVENOUS
  Filled 2016-05-25: qty 2

## 2016-05-25 MED ORDER — FENTANYL CITRATE (PF) 100 MCG/2ML IJ SOLN
INTRAMUSCULAR | Status: AC
  Filled 2016-05-25: qty 2

## 2016-05-25 MED ORDER — FENTANYL CITRATE (PF) 100 MCG/2ML IJ SOLN: 25.0000 ug | INTRAMUSCULAR | Status: DC | PRN

## 2016-05-25 MED ORDER — LIDOCAINE VISCOUS 2 % MT SOLN
OROMUCOSAL | Status: AC
  Filled 2016-05-25: qty 15

## 2016-05-25 MED ORDER — LACTATED RINGERS IV SOLN
INTRAVENOUS | Status: DC
  Administered 2016-05-25: 08:00:00 via INTRAVENOUS

## 2016-05-25 MED ORDER — PROPOFOL 500 MG/50ML IV EMUL
INTRAVENOUS | Status: DC | PRN
  Administered 2016-05-25: 125 ug/kg/min via INTRAVENOUS
  Administered 2016-05-25 (×2): via INTRAVENOUS

## 2016-05-25 MED ORDER — PROPOFOL 10 MG/ML IV BOLUS
INTRAVENOUS | Status: AC
  Filled 2016-05-25: qty 40

## 2016-05-25 MED ORDER — MIDAZOLAM HCL 2 MG/2ML IJ SOLN
INTRAMUSCULAR | Status: AC
  Filled 2016-05-25: qty 2

## 2016-05-25 MED ORDER — LIDOCAINE VISCOUS 2 % MT SOLN
5.0000 mL | Freq: Once | OROMUCOSAL | Status: AC
  Administered 2016-05-25: 5 mL via OROMUCOSAL

## 2016-05-25 NOTE — Anesthesia Preprocedure Evaluation (Signed)
Anesthesia Evaluation  Patient identified by MRN, date of birth, ID band Patient awake    Reviewed: Allergy & Precautions, NPO status , Patient's Chart, lab work & pertinent test results  Airway Mallampati: I  TM Distance: >3 FB     Dental  (+) Teeth Intact, Missing   Pulmonary COPD,  COPD inhaler, former smoker,    breath sounds clear to auscultation       Cardiovascular  Rhythm:Regular Rate:Normal     Neuro/Psych  Headaches, PSYCHIATRIC DISORDERS Anxiety Depression Bipolar Disorder    GI/Hepatic hiatal hernia, GERD  ,  Endo/Other  diabetes, Type 2, Oral Hypoglycemic Agents  Renal/GU      Musculoskeletal   Abdominal   Peds  Hematology   Anesthesia Other Findings   Reproductive/Obstetrics                             Anesthesia Physical Anesthesia Plan  ASA: III  Anesthesia Plan: MAC   Post-op Pain Management:    Induction: Intravenous  Airway Management Planned: Simple Face Mask  Additional Equipment:   Intra-op Plan:   Post-operative Plan:   Informed Consent: I have reviewed the patients History and Physical, chart, labs and discussed the procedure including the risks, benefits and alternatives for the proposed anesthesia with the patient or authorized representative who has indicated his/her understanding and acceptance.     Plan Discussed with:   Anesthesia Plan Comments:         Anesthesia Quick Evaluation

## 2016-05-25 NOTE — Interval H&P Note (Signed)
History and Physical Interval Note:  05/25/2016 8:30 AM  Kenton Kingfisher  has presented today for surgery, with the diagnosis of history of polyps/change in bowels  The various methods of treatment have been discussed with the patient and family. After consideration of risks, benefits and other options for treatment, the patient has consented to  Procedure(s) with comments: COLONOSCOPY WITH PROPOFOL (N/A) - 1400  ESOPHAGOGASTRODUODENOSCOPY (EGD) WITH PROPOFOL (N/A) MALONEY DILATION (N/A) as a surgical intervention .  The patient's history has been reviewed, patient examined, no change in status, stable for surgery.  I have reviewed the patient's chart and labs.  Questions were answered to the patient's satisfaction.     Robert Rourk  No change;  EGD/ED and colonoscopy per plan.The risks, benefits, limitations, imponderables and alternatives regarding both EGD and colonoscopy have been reviewed with the patient. Questions have been answered. All parties agreeable.

## 2016-05-25 NOTE — Anesthesia Postprocedure Evaluation (Signed)
Anesthesia Post Note  Patient: [name]  Procedure(s) Performed: Procedure(s) (LRB): COLONOSCOPY WITH PROPOFOL (N/A) ESOPHAGOGASTRODUODENOSCOPY (EGD) WITH PROPOFOL (N/A) MALONEY DILATION (N/A)  Patient location during evaluation: PACU Anesthesia Type: MAC Level of consciousness: awake and alert, oriented and patient cooperative Pain management: pain level controlled Vital Signs Assessment: post-procedure vital signs reviewed and stable Respiratory status: spontaneous breathing, nonlabored ventilation and respiratory function stable Cardiovascular status: blood pressure returned to baseline and stable Postop Assessment: no signs of nausea or vomiting Anesthetic complications: no    Last Vitals:  Filed Vitals:   05/25/16 0820 05/25/16 0825  BP: 121/61   Temp:    Resp: 19 9    Last Pain:  Filed Vitals:   05/25/16 0825  PainSc: 5                  Duru Reiger J

## 2016-05-25 NOTE — Transfer of Care (Signed)
Immediate Anesthesia Transfer of Care Note  Patient: [name]  Procedure(s) Performed: Procedure(s) with comments: COLONOSCOPY WITH PROPOFOL (N/A) - 1400  ESOPHAGOGASTRODUODENOSCOPY (EGD) WITH PROPOFOL (N/A) MALONEY DILATION (N/A)  Patient Location: PACU  Anesthesia Type:MAC  Level of Consciousness: awake, alert  and patient cooperative  Airway & Oxygen Therapy: Patient Spontanous Breathing and Patient connected to face mask oxygen  Post-op Assessment: Report given to RN, Post -op Vital signs reviewed and stable and Patient moving all extremities  Post vital signs: Reviewed and stable  Last Vitals:  Filed Vitals:   05/25/16 0820 05/25/16 0825  BP: 121/61   Temp:    Resp: 19 9    Last Pain:  Filed Vitals:   05/25/16 0825  PainSc: 5       Patients Stated Pain Goal: 3 ([id number])  Complications: No apparent anesthesia complications

## 2016-05-25 NOTE — Op Note (Signed)
[facility] Patient Name: [name] Procedure Date: 05/25/2016 8:17 AM MRN: [id number] Date of Birth: July 26, 1956 Attending MD: Gennette Pac , MD CSN: [id number] Age: 60 Admit Type: Outpatient Procedure:                Upper GI endoscopy with Elease Hashimoto dilation Indications:              Dysphagia Providers:                Gennette Pac, MD, Nena Polio, RN, Burke Keels, Technician Referring MD:              Medicines:                Monitored Anesthesia Care Complications:            No immediate complications. Estimated Blood Loss:     Estimated blood loss was minimal. Procedure:                Pre-Anesthesia Assessment:                           - Prior to the procedure, a History and Physical                            was performed, and patient medications and                            allergies were reviewed. The patient's tolerance of                            previous anesthesia was also reviewed. The risks                            and benefits of the procedure and the sedation                            options and risks were discussed with the patient.                            All questions were answered, and informed consent                            was obtained. ASA Grade Assessment: II - A patient                            with mild systemic disease. After reviewing the                            risks and benefits, the patient was deemed in                            satisfactory condition to undergo the procedure.  After obtaining informed consent, the endoscope was                            passed under direct vision. Throughout the                            procedure, the patient's blood pressure, pulse, and                            oxygen saturations were monitored continuously. The                            EG-[id number] ([id number]) scope was introduced through the         mouth, and advanced to the second part of duodenum.                            The upper GI endoscopy was accomplished without                            difficulty. The patient tolerated the procedure                            well. Scope In: 8:40:27 AM Scope Out: 8:46:37 AM Total Procedure Duration: 0 hours 6 minutes 10 seconds  Findings:      A low-grade of narrowing Schatzki ring (acquired) was found at the       gastroesophageal junction. No esophagitis. No Barrett's epithelium.      A small hiatal hernia was present. Stomach empty. Gastric mucosa       appeared normal.      The exam was otherwise without abnormality.      The second portion of the duodenum was normal. The scope was withdrawn.       Dilation was performed with a Maloney dilator with mild resistance at 56       Fr. The dilation site was examined following endoscope reinsertion and       showed moderate improvement in luminal narrowing. Estimated blood loss       was minimal. Impression:               - Low-grade of narrowing Schatzki ring. Dilated.                           - Small hiatal hernia.                           - The examination was otherwise normal.                           - Normal second portion of the duodenum.                           - No specimens collected. Moderate Sedation:      Moderate (conscious) sedation was personally administered by an       anesthesia professional. The following parameters were monitored: oxygen       saturation, heart rate, blood pressure, respiratory rate, EKG, adequacy  of pulmonary ventilation, and response to care. Total physician       intraservice time was 42 minutes. Recommendation:           - Patient has a contact number available for                            emergencies. The signs and symptoms of potential                            delayed complications were discussed with the                            patient. Return to normal activities  tomorrow.                            Written discharge instructions were provided to the                            patient.                           - Advance diet as tolerated today.                           - Continue present medications.                           - Return to GI office after studies are complete. Procedure Code(s):        --- Professional ---                           [phone], Esophagogastroduodenoscopy, flexible,                            transoral; diagnostic, including collection of                            specimen(s) by brushing or washing, when performed                            (separate procedure)                           43450, Dilation of esophagus, by unguided sound or                            bougie, single or multiple passes Diagnosis Code(s):        --- Professional ---                           K22.2, Esophageal obstruction                           K44.9, Diaphragmatic hernia without obstruction or  gangrene                           R13.10, Dysphagia, unspecified CPT copyright 2016 American Medical Association. All rights reserved. The codes documented in this report are preliminary and upon coder review may  be revised to meet current compliance requirements. [name] Friends. Khayden Herzberg, MD Gennette Pac, MD 05/25/2016 9:16:31 AM This report has been signed electronically. Number of Addenda: 0

## 2016-05-25 NOTE — Discharge Instructions (Signed)
Colonoscopy Discharge Instructions  Read the instructions outlined below and refer to this sheet in the next few weeks. These discharge instructions provide you with general information on caring for yourself after you leave the hospital. Your doctor may also give you specific instructions. While your treatment has been planned according to the most current medical practices available, unavoidable complications occasionally occur. If you have any problems or questions after discharge, call Dr. Gala Romney at [phone]. ACTIVITY  You may resume your regular activity, but move at a slower pace for the next 24 hours.   Take frequent rest periods for the next 24 hours.   Walking will help get rid of the air and reduce the bloated feeling in your belly (abdomen).   No driving for 24 hours (because of the medicine (anesthesia) used during the test).    Do not sign any important legal documents or operate any machinery for 24 hours (because of the anesthesia used during the test).  NUTRITION  Drink plenty of fluids.   You may resume your normal diet as instructed by your doctor.   Begin with a light meal and progress to your normal diet. Heavy or fried foods are harder to digest and may make you feel sick to your stomach (nauseated).   Avoid alcoholic beverages for 24 hours or as instructed.  MEDICATIONS  You may resume your normal medications unless your doctor tells you otherwise.  WHAT YOU CAN EXPECT TODAY  Some feelings of bloating in the abdomen.   Passage of more gas than usual.   Spotting of blood in your stool or on the toilet paper.  IF YOU HAD POLYPS REMOVED DURING THE COLONOSCOPY:  No aspirin products for 7 days or as instructed.   No alcohol for 7 days or as instructed.   Eat a soft diet for the next 24 hours.  FINDING OUT THE RESULTS OF YOUR TEST Not all test results are available during your visit. If your test results are not back during the visit, make an appointment  with your caregiver to find out the results. Do not assume everything is normal if you have not heard from your caregiver or the medical facility. It is important for you to follow up on all of your test results.  SEEK IMMEDIATE MEDICAL ATTENTION IF:  You have more than a spotting of blood in your stool.   Your belly is swollen (abdominal distention).   You are nauseated or vomiting.   You have a temperature over 101.  You have abdominal pain or discomfort that is severe or gets worse throughout the day. EGD Discharge instructions Please read the instructions outlined below and refer to this sheet in the next few weeks. These discharge instructions provide you with general information on caring for yourself after you leave the hospital. Your doctor may also give you specific instructions. While your treatment has been planned according to the most current medical practices available, unavoidable complications occasionally occur. If you have any problems or questions after discharge, please call your doctor. ACTIVITY You may resume your regular activity but move at a slower pace for the next 24 hours.  Take frequent rest periods for the next 24 hours.  Walking will help expel (get rid of) the air and reduce the bloated feeling in your abdomen.  No driving for 24 hours (because of the anesthesia (medicine) used during the test).  You may shower.  Do not sign any important legal documents or operate any machinery for 24  hours (because of the anesthesia used during the test).  NUTRITION Drink plenty of fluids.  You may resume your normal diet.  Begin with a light meal and progress to your normal diet.  Avoid alcoholic beverages for 24 hours or as instructed by your caregiver.  MEDICATIONS You may resume your normal medications unless your caregiver tells you otherwise.  WHAT YOU CAN EXPECT TODAY You may experience abdominal discomfort such as a feeling of fullness or gas pains.   FOLLOW-UP Your doctor will discuss the results of your test with you.  SEEK IMMEDIATE MEDICAL ATTENTION IF ANY OF THE FOLLOWING OCCUR: Excessive nausea (feeling sick to your stomach) and/or vomiting.  Severe abdominal pain and distention (swelling).  Trouble swallowing.  Temperature over 101 F (37.8 C).  Rectal bleeding or vomiting of blood.    Office visit in 6 months    Dec 5th 9:00 am with Neil Crouch  Repeat colonoscopy in 5 years

## 2016-05-25 NOTE — Op Note (Signed)
[facility] Patient Name: [name] Procedure Date: 05/25/2016 8:46 AM MRN: [id number] Date of Birth: [birth year] Attending MD: Gennette Pac , MD CSN: [id number] Age: 60 Admit Type: Outpatient Procedure:                Colonoscopy Indications:              High risk colon cancer surveillance: Personal                            history of non-advanced adenoma Providers:                Gennette Pac, MD, Nena Polio, RN, Burke Keels, Technician Referring MD:              Medicines:                Monitored Anesthesia Care Complications:            No immediate complications. Estimated Blood Loss:     Estimated blood loss: none. Procedure:                Pre-Anesthesia Assessment:                           - Prior to the procedure, a History and Physical                            was performed, and patient medications and                            allergies were reviewed. The patient's tolerance of                            previous anesthesia was also reviewed. The risks                            and benefits of the procedure and the sedation                            options and risks were discussed with the patient.                            All questions were answered, and informed consent                            was obtained. ASA Grade Assessment: II - A patient                            with mild systemic disease. After reviewing the                            risks and benefits, the patient was deemed in  satisfactory condition to undergo the procedure.                           After obtaining informed consent, the colonoscope                            was passed under direct vision. Throughout the                            procedure, the patient's blood pressure, pulse, and                            oxygen saturations were monitored continuously. The                            EC-[id number]  ([id number]) scope was introduced through                            the anus and advanced to the the cecum, identified                            by appendiceal orifice and ileocecal valve. The                            ileocecal valve, appendiceal orifice, and rectum                            were photographed. The entire colon was examined.                            The colonoscopy was performed without difficulty.                            The patient tolerated the procedure well. The                            quality of the bowel preparation was adequate. Scope In: 8:55:14 AM Scope Out: 9:13:32 AM Scope Withdrawal Time: 0 hours 9 minutes 30 seconds  Total Procedure Duration: 0 hours 18 minutes 18 seconds  Findings:      The perianal and digital rectal examinations were normal.      The colon (entire examined portion) appeared normal.      No additional abnormalities were found on retroflexion. Impression:               - The entire examined colon is normal.                           - No specimens collected. Moderate Sedation:      Moderate (conscious) sedation was personally administered by an       anesthesia professional. The following parameters were monitored: oxygen       saturation, heart rate, blood pressure, respiratory rate, EKG, adequacy       of pulmonary ventilation, and response to care. Total physician       intraservice time was 42 minutes. Recommendation:           -  Patient has a contact number available for                            emergencies. The signs and symptoms of potential                            delayed complications were discussed with the                            patient. Return to normal activities tomorrow.                            Written discharge instructions were provided to the                            patient.                           - Advance diet as tolerated.                           - Continue present medications.                            - Repeat colonoscopy in 5 years for surveillance.                           - Return to GI office in 6 months. Procedure Code(s):        --- Professional ---                           [phone], Colonoscopy, flexible; diagnostic, including                            collection of specimen(s) by brushing or washing,                            when performed (separate procedure) Diagnosis Code(s):        --- Professional ---                           Z86.010, Personal history of colonic polyps CPT copyright 2016 [employer]. All rights reserved. The codes documented in this report are preliminary and upon coder review may  be revised to meet current compliance requirements. Gerrit Friends. Rowdy Guerrini, MD Gennette Pac, MD 05/25/2016 9:19:43 AM This report has been signed electronically. Number of Addenda: 0

## 2016-05-25 NOTE — H&P (View-Only) (Signed)
Primary Care Physician:  Evelina Dun, FNP  Primary Gastroenterologist:  Garfield Cornea, MD   Chief Complaint  Patient presents with  . set up TCS    HPI:  [name] is a 60 y.o. female here For further evaluation of dysphagia and history of adenomatous colon polyps. Patient was last seen around 2013. 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 Esophagus was dilated with a 54 [place] Maloney dilator. She had a 7 mm flat polyp just inside the anal verge, single diminutive polyp in the base of the cecum both removed, pathology of cecal polyp was tubular adenoma. Advised to have a follow-up colonoscopy in 5 years.  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 Status post resection and radiation therapy. In June of this year and will be 1 year since she completed radiation therapy. Still going to the effects of radiation with poor taste, diminished salivation, has to think to swallow. She feels like her swallowing issues are also related to her softest just like in the past. Feels like food sticking in the upper esophagus. Note 70 pound weight loss since her cancer diagnosis and treatment, has gained 10 pounds back. Heartburn controlled on omeprazole. Complains of new constipation for 1 year. Has to take a laxative to have a bowel movement. Feels bloated. No melena or bright red blood per rectum. Has the urge to have a bowel movement but difficulty passing stool. Sometimes utilizes suppositories. Can go weeks without a bowel movement if she doesn't take something.    Current Outpatient Prescriptions  Medication Sig Dispense Refill  . ACCU-CHEK AVIVA PLUS test strip USE TO TEST TWO TO THREE TIMES DAILY 100 each 2  . ACCU-CHEK SOFTCLIX LANCETS lancets USE TO TEST THREE TIMES DAILY 100 each 2  . ADVAIR DISKUS 250-50 MCG/DOSE AEPB INHALE 1 PUFF BY MOUTH TWICE DAILY 60 each 4  .  ALPRAZolam (XANAX) 1 MG tablet Take 1 tablet (1 mg total) by mouth 2 (two) times daily. 60 tablet 3  . buPROPion (WELLBUTRIN XL) 150 MG 24 hr tablet TAKE 1 TABLET BY MOUTH EVERY DAY 90 tablet 0  . DULoxetine (CYMBALTA) 60 MG capsule Take 1 capsule (60 mg total) by mouth daily. 90 capsule 3  . fluticasone (FLONASE) 50 MCG/ACT nasal spray INSTILL 2 SPRAYS INTO EACH NOSTRIL EVERY DAY 48 g 0  . ibuprofen (ADVIL,MOTRIN) 600 MG tablet Take 1 tablet (600 mg total) by mouth every 8 (eight) hours as needed. Patient takes only as needed - less than daily 30 tablet 3  . loratadine (CLARITIN) 10 MG tablet TAKE 1 TABLET BY MOUTH EVERY DAY 90 tablet 0  . magic mouthwash SOLN Take 5 mLs by mouth 4 (four) times daily as needed for mouth pain. 500 mL 6  . meloxicam (MOBIC) 15 MG tablet TAKE 1 TABLET BY MOUTH EVERY DAY 90 tablet 0  . metFORMIN (GLUCOPHAGE) 1000 MG tablet TAKE 1 TABLET BY MOUTH TWICE DAILY 180 tablet 0  . omeprazole (PRILOSEC) 40 MG capsule TAKE 1 CAPSULE BY MOUTH EVERY DAY 90 capsule 0  . pravastatin (PRAVACHOL) 20 MG tablet Take 1 tablet (20 mg total) by mouth daily. 90 tablet 3  . PROAIR HFA 108 (90 Base) MCG/ACT inhaler INHALE 2 PUFFS EVERY 4 TO 6 HOURS AS NEEDED 32 g 0  . sodium fluoride (FLUORISHIELD) 1.1 % GEL dental gel Instill one drop of gel per tooth space of  fluoride tray. Place over teeth for 5 minutes. Remove. Spit out excess. Repeat nightly. 120 mL 11  . solifenacin (VESICARE) 10 MG tablet Take 1 tablet (10 mg total) by mouth daily. 90 tablet 1  . tiotropium (SPIRIVA HANDIHALER) 18 MCG inhalation capsule Place 1 capsule (18 mcg total) into inhaler and inhale daily. 30 capsule 5  . traZODone (DESYREL) 150 MG tablet TAKE 1 TABLET BY MOUTH AT BEDTIME 90 tablet 0  . Vitamin D, Ergocalciferol, (DRISDOL) 50000 units CAPS capsule TAKE 1 CAPSULE BY MOUTH ONCE A WEEK 12 capsule 3   No current facility-administered medications for this visit.    Allergies as of 04/27/2016 - Review Complete  04/27/2016  Allergen Reaction Noted  . Penicillins Anaphylaxis and Swelling 03/08/2012  . Bee venom  03/08/2012    Past Medical History  Diagnosis Date  . Diabetes mellitus   . COPD (chronic obstructive pulmonary disease) ([place]) 2011  . GERD (gastroesophageal reflux disease)   . Hypertriglyceridemia     ?  Marland Kitchen Allergic rhinitis   . Breast cancer ([place]) 2008    chemotherapy, XRT  . Reflux esophagitis 2008    egd  . Adenomatous colon polyp 01/2007  . Anxiety   . Bipolar disorder ([place])     [facility]  . Neoplasm of submandibular gland     left    Past Surgical History  Procedure Laterality Date  . Esophagogastroduodenoscopy  02/14/07    distal esophageal erosions   . Colonoscopy  02/14/07    engorged external and internal hemorrhoids with recent bleeding, diminutive polyp in the cecum, tubular adenoma, normal TI  . Mastectomy partial / lumpectomy  2008  . Tubal ligation    . Ectopic pregnancy surgery    . Vaginal hysterectomy  10/2007    with vaginal vault suspension  . Colonoscopy  03/30/2012    RMR: rectal and colonic polyps -removed as described above. Mellanosis coli  . 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     Family History  Problem Relation Age of Onset  . Colon cancer Paternal Grandmother 19    deceased  . Liver disease Neg Hx   . Diabetes Daughter 7    type 1  . Diabetes Mother   . Hypertension Mother   . Cancer Mother     unknown primary    Social History   Social History  . Marital Status: Married    Spouse Name: N/A  . Number of Children: 3  . Years of Education: N/A   Occupational History  . Not on file.   Social History Main Topics  . Smoking status: Former Smoker -- 1.50 packs/day for 25 years    Types: Cigarettes    Quit date: 01/09/1996  . Smokeless tobacco: Never Used  . Alcohol Use: No  . Drug Use: No  . Sexual Activity: Not on file   Other Topics Concern  . Not on  file   Social History Narrative      ROS:  General: Negative for anorexia, weight loss, fever, chills, fatigue, weakness. Eyes: Negative for vision changes.  ENT: see hpi CV: Negative for chest pain, angina, palpitations, dyspnea on exertion, peripheral edema.  Respiratory: Negative for dyspnea at rest, dyspnea on exertion, cough, sputum, wheezing.  GI: See history of present illness. GU:  Negative for dysuria, hematuria, urinary incontinence, urinary frequency, nocturnal urination.  MS: Negative for joint pain, low back pain.  Derm: Negative for rash or itching.  Neuro: Negative for weakness, abnormal sensation, seizure, frequent headaches, memory loss, confusion.  Psych: Negative for anxiety, depression, suicidal ideation, hallucinations.  Endo: Negative for unusual weight change.  Heme: Negative for bruising or bleeding. Allergy: Negative for rash or hives.    Physical Examination:  BP 123/74 mmHg  Pulse 80  Temp(Src) 97.4 F (36.3 C)  Ht 5\' 7"  (1.702 m)  Wt 179 lb 12.8 oz (81.557 kg)  BMI 28.15 kg/m2   General: Well-nourished, well-developed in no acute distress.  Head: Normocephalic, atraumatic.   Eyes: Conjunctiva pink, no icterus. Mouth: Oropharyngeal mucosa moist and pink , no lesions erythema or exudate. Neck: Supple without thyromegaly, masses, or lymphadenopathy.  Lungs: Clear to auscultation bilaterally.  Heart: Regular rate and rhythm, no murmurs rubs or gallops.  Abdomen: Bowel sounds are normal, nontender, nondistended, no hepatosplenomegaly or masses, no abdominal bruits or    hernia , no rebound or guarding.   Rectal: not performed Extremities: No lower extremity edema. No clubbing or deformities.  Neuro: Alert and oriented x 4 , grossly normal neurologically.  Skin: Warm and dry, no rash or jaundice.   Psych: Alert and cooperative, normal mood and affect.  Labs: Lab Results  Component Value Date   CREATININE 0.83 12/27/2015   BUN 13 12/27/2015    NA 139 12/27/2015   K 4.2 12/27/2015   CL 96 12/27/2015   CO2 28 12/27/2015   Lab Results  Component Value Date   ALT 7 12/27/2015   AST 13 12/27/2015   ALKPHOS 69 12/27/2015   BILITOT 0.2 12/27/2015     Imaging Studies: Dg Finger Ring Right  04/03/2016  CLINICAL DATA:  Injured finger in January. Continued pain and swelling. EXAM: RIGHT RING FINGER 2+V COMPARISON:  None. FINDINGS: No acute bony abnormality. Specifically, no fracture, subluxation, or dislocation. Soft tissues are intact. IMPRESSION: No acute bony abnormality. Electronically Signed   By: Rolm Baptise M.D.   On: 04/03/2016 08:56

## 2016-05-27 ENCOUNTER — Encounter (HOSPITAL_COMMUNITY): Payer: Self-pay | Admitting: Internal Medicine

## 2016-06-08 ENCOUNTER — Other Ambulatory Visit: Payer: Self-pay | Admitting: Family

## 2016-06-08 NOTE — Telephone Encounter (Signed)
Last filled 05/08/16, last seen 04/02/16. Route to pool to call in

## 2016-06-08 NOTE — Telephone Encounter (Signed)
rx called to [facility]

## 2016-06-12 ENCOUNTER — Other Ambulatory Visit: Payer: Self-pay | Admitting: Family

## 2016-06-21 ENCOUNTER — Other Ambulatory Visit: Payer: Self-pay | Admitting: Family

## 2016-07-02 ENCOUNTER — Ambulatory Visit: Payer: Medicaid Other | Admitting: Family

## 2016-07-03 ENCOUNTER — Encounter: Payer: Self-pay | Admitting: Family

## 2016-07-03 ENCOUNTER — Ambulatory Visit (INDEPENDENT_AMBULATORY_CARE_PROVIDER_SITE_OTHER): Payer: Medicaid Other | Admitting: Family

## 2016-07-03 ENCOUNTER — Other Ambulatory Visit: Payer: Self-pay | Admitting: *Deleted

## 2016-07-03 VITALS — BP 104/67 | HR 87 | Temp 97.2°F | Ht 67.0 in | Wt 181.6 lb

## 2016-07-03 DIAGNOSIS — F411 Generalized anxiety disorder: Secondary | ICD-10-CM

## 2016-07-03 DIAGNOSIS — K219 Gastro-esophageal reflux disease without esophagitis: Secondary | ICD-10-CM | POA: Diagnosis not present

## 2016-07-03 DIAGNOSIS — K5900 Constipation, unspecified: Secondary | ICD-10-CM | POA: Diagnosis not present

## 2016-07-03 DIAGNOSIS — E785 Hyperlipidemia, unspecified: Secondary | ICD-10-CM

## 2016-07-03 DIAGNOSIS — N3946 Mixed incontinence: Secondary | ICD-10-CM | POA: Diagnosis not present

## 2016-07-03 DIAGNOSIS — F329 Major depressive disorder, single episode, unspecified: Secondary | ICD-10-CM | POA: Diagnosis not present

## 2016-07-03 DIAGNOSIS — J449 Chronic obstructive pulmonary disease, unspecified: Secondary | ICD-10-CM | POA: Diagnosis not present

## 2016-07-03 DIAGNOSIS — G4700 Insomnia, unspecified: Secondary | ICD-10-CM

## 2016-07-03 DIAGNOSIS — E119 Type 2 diabetes mellitus without complications: Secondary | ICD-10-CM

## 2016-07-03 DIAGNOSIS — E559 Vitamin D deficiency, unspecified: Secondary | ICD-10-CM

## 2016-07-03 DIAGNOSIS — F32A Depression, unspecified: Secondary | ICD-10-CM

## 2016-07-03 LAB — BAYER DCA HB A1C WAIVED: HB A1C: 6 % (ref <7.0)

## 2016-07-03 MED ORDER — ALPRAZOLAM 1 MG PO TABS: 1.0000 mg | ORAL_TABLET | Freq: Two times a day (BID) | ORAL | Status: DC

## 2016-07-03 MED ORDER — BUPROPION HCL ER (XL) 300 MG PO TB24: 300.0000 mg | ORAL_TABLET | Freq: Every day | ORAL | Status: DC

## 2016-07-03 MED ORDER — ASPIRIN EC 81 MG PO TBEC: 81.0000 mg | DELAYED_RELEASE_TABLET | Freq: Every day | ORAL | Status: DC

## 2016-07-03 MED ORDER — VORTIOXETINE HBR 20 MG PO TABS
10.0000 mg | ORAL_TABLET | Freq: Every day | ORAL | Status: DC
Start: 2016-07-03 — End: 2016-07-03

## 2016-07-03 MED ORDER — LINACLOTIDE 145 MCG PO CAPS: 145.0000 ug | ORAL_CAPSULE | Freq: Every day | ORAL | Status: DC

## 2016-07-03 MED ORDER — VORTIOXETINE HBR 20 MG PO TABS: 20.0000 mg | ORAL_TABLET | Freq: Every day | ORAL | Status: DC

## 2016-07-03 MED ORDER — METFORMIN HCL 1000 MG PO TABS: 1000.0000 mg | ORAL_TABLET | Freq: Two times a day (BID) | ORAL | Status: DC

## 2016-07-03 NOTE — Progress Notes (Signed)
Subjective:    Patient ID: [name], female    DOB: 1956-08-25, 60 y.o.   MRN: [id number]  Pt presents to the office today for chronic follow up. Pt history of Left submandibular cancer.  Diabetes She presents for her follow-up diabetic visit. She has type 2 diabetes mellitus. Her disease course has been stable. Hypoglycemia symptoms include nervousness/anxiousness. Pertinent negatives for hypoglycemia include no headaches. Associated symptoms include blurred vision. Pertinent negatives for diabetes include no foot paresthesias, no foot ulcerations and no visual change. There are no hypoglycemic complications. Pertinent negatives for hypoglycemia complications include no blackouts. Symptoms are stable. Diabetic complications include peripheral neuropathy. Pertinent negatives for diabetic complications include no CVA, heart disease or nephropathy. Risk factors for coronary artery disease include diabetes mellitus, obesity and post-menopausal. Current diabetic treatment includes oral agent (dual therapy). She is compliant with treatment all of the time. She is following a generally healthy diet. Her breakfast blood glucose range is generally 90-110 mg/dl. (Pt not taking blood sugars  ) An ACE inhibitor/angiotensin II receptor blocker is being taken. Eye exam is current.  Hyperlipidemia This is a chronic problem. The current episode started more than 1 year ago. The problem is controlled. Recent lipid tests were reviewed and are normal. Exacerbating diseases include diabetes. Pertinent negatives include no shortness of breath. Current antihyperlipidemic treatment includes statins. The current treatment provides moderate improvement of lipids. Risk factors for coronary artery disease include diabetes mellitus, dyslipidemia, family history, obesity and post-menopausal.  Gastroesophageal Reflux She reports no belching, no choking, no coughing, no heartburn, no sore throat or no tooth decay. This is a  chronic problem. The current episode started more than 1 year ago. The problem occurs rarely. The symptoms are aggravated by certain foods and lying down. Pertinent negatives include no muscle weakness. She has tried a PPI for the symptoms. The treatment provided significant relief.  Anxiety Presents for follow-up visit. Onset was 1 to 6 months ago. The problem has been gradually worsening. Symptoms include depressed mood, excessive worry, insomnia, irritability, nervous/anxious behavior, palpitations, panic and restlessness. Patient reports no shortness of breath. Symptoms occur constantly. The severity of symptoms is moderate. Exacerbated by: Pt currently being treated for cancer. The quality of sleep is fair.   Her past medical history is significant for anxiety/panic attacks and depression. Past treatments include benzodiazephines and SSRIs. The treatment provided mild relief.  Depression      The patient presents with depression.  This is a chronic problem.  The current episode started more than 1 year ago.   The onset quality is gradual.   The problem occurs constantly.  The problem has been waxing and waning since onset.  Associated symptoms include insomnia, restlessness and sad.  Associated symptoms include no helplessness, no hopelessness and no headaches.     The symptoms are aggravated by family issues.  Compliance with treatment is good.  Past medical history includes anxiety and depression.   Insomnia Primary symptoms: fragmented sleep, difficulty falling asleep.  The current episode started more than one year. The onset quality is gradual. The problem has been waxing and waning since onset. PMH includes: depression.  Constipation This is a chronic problem. The current episode started more than 1 year ago. The problem has been waxing and waning since onset. Her stool frequency is 2 to 3 times per week. The patient is on a high fiber diet. She exercises regularly. Risk factors include obesity  and recent illness. She has tried laxatives and  diet changes for the symptoms. The treatment provided mild relief.  COPD  Pt states she uses her Advair BID and states her breathing is good. Pt states she has been staying in the house which helps.  Mixed Incontinence  PT states she is currently taking [name] 50 mg daily. Pt is currently going to Urologists who is manages this. Pt states she had her TEN's unit placed yesterday. Pt states this seems to be helping.     Review of Systems  Constitutional: Positive for irritability.  HENT: Negative.  Negative for sore throat.   Eyes: Positive for blurred vision.  Respiratory: Negative.  Negative for cough, choking and shortness of breath.   Cardiovascular: Positive for palpitations.  Gastrointestinal: Positive for constipation. Negative for heartburn.  Endocrine: Negative.   Genitourinary: Negative.   Musculoskeletal: Negative.  Negative for muscle weakness.  Neurological: Negative for headaches.  Hematological: Negative.   Psychiatric/Behavioral: Positive for depression. The patient is nervous/anxious and has insomnia.   All other systems reviewed and are negative.      Objective:   Physical Exam  Constitutional: She is oriented to person, place, and time. She appears well-developed and well-nourished. No distress.  HENT:  Head: Normocephalic and atraumatic.  Right Ear: External ear normal.  Left Ear: External ear normal.  Nose: Nose normal.  Mouth/Throat: Oropharynx is clear and moist.  Eyes: Pupils are equal, round, and reactive to light.  Neck: Normal range of motion. Neck supple. No thyromegaly present.  Cardiovascular: Normal rate, regular rhythm, normal heart sounds and intact distal pulses.   No murmur heard. Pulmonary/Chest: Effort normal and breath sounds normal. No respiratory distress. She has no wheezes.  Abdominal: Soft. Bowel sounds are normal. She exhibits no distension. There is no tenderness.  Musculoskeletal:  Normal range of motion. She exhibits no edema or tenderness.  Neurological: She is alert and oriented to person, place, and time. She has normal reflexes. No cranial nerve deficit.  Skin: Skin is warm and dry.  Psychiatric: Judgment and thought content normal. Her mood appears anxious. She is hyperactive.  Vitals reviewed.   BP 104/67 mmHg  Pulse 87  Temp(Src) 97.2 F (36.2 C) (Oral)  Ht _0  (1.702 m)  Wt 181 lb 9.6 oz (82.373 kg)  BMI 28.44 kg/m2       Assessment & Plan:  1. Chronic obstructive pulmonary disease, unspecified COPD type ([place]) - CMP14+EGFR  2. Gastroesophageal reflux disease, esophagitis presence not specified - CMP14+EGFR  3. Type 2 diabetes mellitus without complication, without long-term current use of insulin ([facility]) - CMP14+EGFR - Bayer DCA Hb A1c Waived - Microalbumin / creatinine urine ratio - aspirin EC 81 MG tablet; Take 1 tablet (81 mg total) by mouth daily.  Dispense: 30 tablet; Refill: 1 - metFORMIN (GLUCOPHAGE) 1000 MG tablet; Take 1 tablet (1,000 mg total) by mouth 2 (two) times daily.  Dispense: 180 tablet; Refill: 0  4. GAD (generalized anxiety disorder) -Cymbalta stopped today and started on Trintellix 20 mg Wellbutrin increased to 300 mg from 150 mg -Gave list of local psychologists to patient to contact -Stress management discussed - CMP14+EGFR - vortioxetine HBr (TRINTELLIX) 20 MG TABS; Take 10 mg by mouth daily.  Dispense: 30 tablet; Refill: 3 - ALPRAZolam (XANAX) 1 MG tablet; Take 1 tablet (1 mg total) by mouth 2 (two) times daily.  Dispense: 60 tablet; Refill: 3 - buPROPion (WELLBUTRIN XL) 300 MG 24 hr tablet; Take 1 tablet (300 mg total) by mouth daily.  Dispense: 90 tablet; Refill:  1  5. Insomnia - CMP14+EGFR  6. Vitamin D deficiency - CMP14+EGFR - VITAMIN D 25 Hydroxy (Vit-D Deficiency, Fractures)  7. Hyperlipidemia - CMP14+EGFR  8. Mixed incontinence urge and stress -Keep appts with Urologists  - CMP14+EGFR  9.  Depression -Cymbalta stopped today and started on Trintellix 20 mg Wellbutrin increased to 300 mg from 150 mg -Gave list of local psychologists to patient to contact -Stress management discussed - CMP14+EGFR - vortioxetine HBr (TRINTELLIX) 20 MG TABS; Take 10 mg by mouth daily.  Dispense: 30 tablet; Refill: 3 - buPROPion (WELLBUTRIN XL) 300 MG 24 hr tablet; Take 1 tablet (300 mg total) by mouth daily.  Dispense: 90 tablet; Refill: 1  10. Constipation, unspecified constipation type -Linzess decreased to 145 mcg today from 290 mcg - CMP14+EGFR - linaclotide (LINZESS) 145 MCG CAPS capsule; Take 1 capsule (145 mcg total) by mouth daily before breakfast.  Dispense: 30 capsule; Refill: 3   Continue all meds Labs pending Health Maintenance reviewed Diet and exercise encouraged RTO 3 months  Evelina Dun, FNP

## 2016-07-03 NOTE — Addendum Note (Signed)
Addended by: Nigel Berthold C on: 07/03/2016 12:31 PM   Modules accepted: Orders

## 2016-07-03 NOTE — Patient Instructions (Signed)
Stress and Stress Management Stress is a normal reaction to life events. It is what you feel when life demands more than you are used to or more than you can handle. Some stress can be useful. For example, the stress reaction can help you catch the last bus of the day, study for a test, or meet a deadline at work. But stress that occurs too often or for too long can cause problems. It can affect your emotional health and interfere with relationships and normal daily activities. Too much stress can weaken your immune system and increase your risk for physical illness. If you already have a medical problem, stress can make it worse. CAUSES  All sorts of life events may cause stress. An event that causes stress for one person may not be stressful for another person. Major life events commonly cause stress. These may be positive or negative. Examples include losing your job, moving into a new home, getting married, having a baby, or losing a loved one. Less obvious life events may also cause stress, especially if they occur day after day or in combination. Examples include working long hours, driving in traffic, caring for children, being in debt, or being in a difficult relationship. SIGNS AND SYMPTOMS Stress may cause emotional symptoms including, the following:  Anxiety. This is feeling worried, afraid, on edge, overwhelmed, or out of control.  Anger. This is feeling irritated or impatient.  Depression. This is feeling sad, down, helpless, or guilty.  Difficulty focusing, remembering, or making decisions. Stress may cause physical symptoms, including the following:   Aches and pains. These may affect your head, neck, back, stomach, or other areas of your body.  Tight muscles or clenched jaw.  Low energy or trouble sleeping. Stress may cause unhealthy behaviors, including the following:   Eating to feel better (overeating) or skipping meals.  Sleeping too little, too much, or both.  Working  too much or putting off tasks (procrastination).  Smoking, drinking alcohol, or using drugs to feel better. DIAGNOSIS  Stress is diagnosed through an assessment by your health care provider. Your health care provider will ask questions about your symptoms and any stressful life events.Your health care provider will also ask about your medical history and may order blood tests or other tests. Certain medical conditions and medicine can cause physical symptoms similar to stress. Mental illness can cause emotional symptoms and unhealthy behaviors similar to stress. Your health care provider may refer you to a mental health professional for further evaluation.  TREATMENT  Stress management is the recommended treatment for stress.The goals of stress management are reducing stressful life events and coping with stress in healthy ways.  Techniques for reducing stressful life events include the following:  Stress identification. Self-monitor for stress and identify what causes stress for you. These skills may help you to avoid some stressful events.  Time management. Set your priorities, keep a calendar of events, and learn to say "no." These tools can help you avoid making too many commitments. Techniques for coping with stress include the following:  Rethinking the problem. Try to think realistically about stressful events rather than ignoring them or overreacting. Try to find the positives in a stressful situation rather than focusing on the negatives.  Exercise. Physical exercise can release both physical and emotional tension. The key is to find a form of exercise you enjoy and do it regularly.  Relaxation techniques. These relax the body and mind. Examples include yoga, meditation, tai chi, biofeedback, deep  breathing, progressive muscle relaxation, listening to music, being out in nature, journaling, and other hobbies. Again, the key is to find one or more that you enjoy and can do  regularly.  Healthy lifestyle. Eat a balanced diet, get plenty of sleep, and do not smoke. Avoid using alcohol or drugs to relax.  Strong support network. Spend time with family, friends, or other people you enjoy being around.Express your feelings and talk things over with someone you trust. Counseling or talktherapy with a mental health professional may be helpful if you are having difficulty managing stress on your own. Medicine is typically not recommended for the treatment of stress.Talk to your health care provider if you think you need medicine for symptoms of stress. HOME CARE INSTRUCTIONS  Keep all follow-up visits as directed by your health care provider.  Take all medicines as directed by your health care provider. SEEK MEDICAL CARE IF:  Your symptoms get worse or you start having new symptoms.  You feel overwhelmed by your problems and can no longer manage them on your own. SEEK IMMEDIATE MEDICAL CARE IF:  You feel like hurting yourself or someone else.   This information is not intended to replace advice given to you by your health care provider. Make sure you discuss any questions you have with your health care provider.   Document Released: 06/02/2001 Document Revised: 12/28/2014 Document Reviewed: 08/01/2013 Elsevier Interactive Patient Education 2016 Else[employer] Inc[employer]  

## 2016-07-04 LAB — CMP14+EGFR
ALK PHOS: 69 IU/L (ref 39–117)
ALT: 12 IU/L (ref 0–32)
AST: 13 IU/L (ref 0–40)
Albumin/Globulin Ratio: 2.6 — ABNORMAL HIGH (ref 1.2–2.2)
Albumin: 4.7 g/dL (ref 3.6–4.8)
BUN/Creatinine Ratio: 12 (ref 12–28)
BUN: 13 mg/dL (ref 8–27)
Bilirubin Total: 0.3 mg/dL (ref 0.0–1.2)
CHLORIDE: 95 mmol/L — AB (ref 96–106)
CO2: 27 mmol/L (ref 18–29)
CREATININE: 1.1 mg/dL — AB (ref 0.57–1.00)
Calcium: 9.7 mg/dL (ref 8.7–10.3)
GFR calc Af Amer: 63 mL/min/{1.73_m2} (ref >59)
GFR calc non Af Amer: 54 mL/min/{1.73_m2} — ABNORMAL LOW (ref >59)
GLOBULIN, TOTAL: 1.8 g/dL (ref 1.5–4.5)
Glucose: 108 mg/dL — ABNORMAL HIGH (ref 65–99)
POTASSIUM: 4.6 mmol/L (ref 3.5–5.2)
SODIUM: 136 mmol/L (ref 134–144)
Total Protein: 6.5 g/dL (ref 6.0–8.5)

## 2016-07-04 LAB — MICROALBUMIN / CREATININE URINE RATIO
CREATININE, UR: 22.7 mg/dL
MICROALB/CREAT RATIO: <13.2 mg/g creat (ref 0.0–30.0)

## 2016-07-04 LAB — VITAMIN D 25 HYDROXY (VIT D DEFICIENCY, FRACTURES): Vit D, 25-Hydroxy: 59.8 ng/mL (ref 30.0–100.0)

## 2016-07-13 ENCOUNTER — Other Ambulatory Visit: Payer: Self-pay | Admitting: Family

## 2016-07-17 ENCOUNTER — Other Ambulatory Visit: Payer: Self-pay | Admitting: Urology

## 2016-07-21 ENCOUNTER — Other Ambulatory Visit: Payer: Self-pay | Admitting: Family

## 2016-07-27 ENCOUNTER — Encounter: Payer: Self-pay | Admitting: Family

## 2016-07-27 ENCOUNTER — Ambulatory Visit (INDEPENDENT_AMBULATORY_CARE_PROVIDER_SITE_OTHER): Payer: Medicaid Other | Admitting: Family

## 2016-07-27 VITALS — BP 105/64 | HR 80 | Temp 97.3°F | Ht 67.0 in | Wt 181.8 lb

## 2016-07-27 DIAGNOSIS — F411 Generalized anxiety disorder: Secondary | ICD-10-CM | POA: Diagnosis not present

## 2016-07-27 DIAGNOSIS — F410 Panic disorder [episodic paroxysmal anxiety] without agoraphobia: Secondary | ICD-10-CM | POA: Diagnosis not present

## 2016-07-27 DIAGNOSIS — F329 Major depressive disorder, single episode, unspecified: Secondary | ICD-10-CM | POA: Diagnosis not present

## 2016-07-27 DIAGNOSIS — F32A Depression, unspecified: Secondary | ICD-10-CM

## 2016-07-27 NOTE — Patient Instructions (Signed)
Panic Attacks Panic attacks are sudden, short-livedsurges of severe anxiety, fear, or discomfort. They may occur for no reason when you are relaxed, when you are anxious, or when you are sleeping. Panic attacks may occur for a number of reasons:   Healthy people occasionally have panic attacks in extreme, life-threatening situations, such as war or natural disasters. Normal anxiety is a protective mechanism of the body that helps us react to danger (fight or flight response).  Panic attacks are often seen with anxiety disorders, such as panic disorder, social anxiety disorder, generalized anxiety disorder, and phobias. Anxiety disorders cause excessive or uncontrollable anxiety. They may interfere with your relationships or other life activities.  Panic attacks are sometimes seen with other mental illnesses, such as depression and posttraumatic stress disorder.  Certain medical conditions, prescription medicines, and drugs of abuse can cause panic attacks. SYMPTOMS  Panic attacks start suddenly, peak within 20 minutes, and are accompanied by four or more of the following symptoms:  Pounding heart or fast heart rate (palpitations).  Sweating.  Trembling or shaking.  Shortness of breath or feeling smothered.  Feeling choked.  Chest pain or discomfort.  Nausea or strange feeling in your stomach.  Dizziness, light-headedness, or feeling like you will faint.  Chills or hot flushes.  Numbness or tingling in your lips or hands and feet.  Feeling that things are not real or feeling that you are not yourself.  Fear of losing control or going crazy.  Fear of dying. Some of these symptoms can mimic serious medical conditions. For example, you may think you are having a heart attack. Although panic attacks can be very scary, they are not life threatening. DIAGNOSIS  Panic attacks are diagnosed through an assessment by your health care provider. Your health care provider will ask  questions about your symptoms, such as where and when they occurred. Your health care provider will also ask about your medical history and use of alcohol and drugs, including prescription medicines. Your health care provider may order blood tests or other studies to rule out a serious medical condition. Your health care provider may refer you to a mental health professional for further evaluation. TREATMENT   Most healthy people who have one or two panic attacks in an extreme, life-threatening situation will not require treatment.  The treatment for panic attacks associated with anxiety disorders or other mental illness typically involves counseling with a mental health professional, medicine, or a combination of both. Your health care provider will help determine what treatment is best for you.  Panic attacks due to physical illness usually go away with treatment of the illness. If prescription medicine is causing panic attacks, talk with your health care provider about stopping the medicine, decreasing the dose, or substituting another medicine.  Panic attacks due to alcohol or drug abuse go away with abstinence. Some adults need professional help in order to stop drinking or using drugs. HOME CARE INSTRUCTIONS   Take all medicines as directed by your health care provider.   Schedule and attend follow-up visits as directed by your health care provider. It is important to keep all your appointments. SEEK MEDICAL CARE IF:  You are not able to take your medicines as prescribed.  Your symptoms do not improve or get worse. SEEK IMMEDIATE MEDICAL CARE IF:   You experience panic attack symptoms that are different than your usual symptoms.  You have serious thoughts about hurting yourself or others.  You are taking medicine for panic attacks and   have a serious side effect. MAKE SURE YOU:  Understand these instructions.  Will watch your condition.  Will get help right away if you are not  doing well or get worse.   This information is not intended to replace advice given to you by your health care provider. Make sure you discuss any questions you have with your health care provider.   Document Released: 12/07/2005 Document Revised: 12/12/2013 Document Reviewed: 07/21/2013 Elsevier Interactive Patient Education 2016 Elsevier Inc[employer] Generalized Anxiety Disorder Generalized anxiety disorder (GAD) is a mental disorder. It interferes with life functions, including relationships, work, and school. GAD is different from normal anxiety, which everyone experiences at some point in their lives in response to specific life events and activities. Normal anxiety actually helps [place] prepare for and get through these life events and activities. Normal anxiety goes away after the event or activity is over.  GAD causes anxiety that is not necessarily related to specific events or activities. It also causes excess anxiety in proportion to specific events or activities. The anxiety associated with GAD is also difficult to control. GAD can vary from mild to severe. People with severe GAD can have intense waves of anxiety with physical symptoms (panic attacks).  SYMPTOMS The anxiety and worry associated with GAD are difficult to control. This anxiety and worry are related to many life events and activities and also occur more days than not for 6 months or longer. People with GAD also have three or more of the following symptoms (one or more in children):  Restlessness.   Fatigue.  Difficulty concentrating.   Irritability.  Muscle tension.  Difficulty sleeping or unsatisfying sleep. DIAGNOSIS GAD is diagnosed through an assessment by your health care provider. Your health care provider will ask you questions aboutyour mood,physical symptoms, and events in your life. Your health care provider may ask you about your medical history and use of alcohol or drugs, including prescription medicines. Your  health care provider may also do a physical exam and blood tests. Certain medical conditions and the use of certain substances can cause symptoms similar to those associated with GAD. Your health care provider may refer you to a mental health specialist for further evaluation. TREATMENT The following therapies are usually used to treat GAD:   Medication. Antidepressant medication usually is prescribed for long-term daily control. Antianxiety medicines may be added in severe cases, especially when panic attacks occur.   Talk therapy (psychotherapy). Certain types of talk therapy can be helpful in treating GAD by providing support, education, and guidance. A form of talk therapy called cognitive behavioral therapy can teach you healthy ways to think about and react to daily life events and activities.  Stress managementtechniques. These include yoga, meditation, and exercise and can be very helpful when they are practiced regularly. A mental health specialist can help determine which treatment is best for you. Some people see improvement with one therapy. However, other people require a combination of therapies.   This information is not intended to replace advice given to you by your health care provider. Make sure you discuss any questions you have with your health care provider.   Document Released: 04/03/2013 Document Revised: 12/28/2014 Document Reviewed: 04/03/2013 Elsevier Interactive Patient Education [employer].

## 2016-07-27 NOTE — Progress Notes (Signed)
   Subjective:    Patient ID: [name], female    DOB: 1956/08/24, 60 y.o.   MRN: [id number]  HPI PT presents to the office today for hospital follow up for dypnea on 07/15/16. PT was had a negative chest xray and negative blood work. Pt had a negative D Dimer and troponin. Pt does states she feels anxious and depressed. Pt was started on trintellix 20 mg last appt and told to continue Wellbutrin XL 300 mg and xanax 1 mg prn BID. Pt was also given list of psychologists to make an appt with, but patient has not made appointment. PT states she has a great deal of stress with her children and husband. PT is tearful today.   [facility] notes were reviewed.   Review of Systems  Constitutional: Negative.   HENT: Negative.   Eyes: Negative.   Respiratory: Negative.  Negative for shortness of breath.   Cardiovascular: Negative.  Negative for palpitations.  Gastrointestinal: Negative.   Endocrine: Negative.   Genitourinary: Negative.   Musculoskeletal: Negative.   Neurological: Negative.  Negative for headaches.  Hematological: Negative.   Psychiatric/Behavioral: Positive for decreased concentration. Negative for suicidal ideas. The patient is nervous/anxious.   All other systems reviewed and are negative.      Objective:   Physical Exam  Constitutional: She is oriented to person, place, and time. She appears well-developed and well-nourished. No distress.  HENT:  Head: Normocephalic and atraumatic.  Eyes: Pupils are equal, round, and reactive to light.  Neck: Normal range of motion. Neck supple. No thyromegaly present.  Cardiovascular: Normal rate, regular rhythm, normal heart sounds and intact distal pulses.   No murmur heard. Pulmonary/Chest: Effort normal and breath sounds normal. No respiratory distress. She has no wheezes.  Abdominal: Soft. Bowel sounds are normal. She exhibits no distension. There is no tenderness.  Musculoskeletal: Normal range of motion. She exhibits no  edema or tenderness.  Neurological: She is alert and oriented to person, place, and time.  Skin: Skin is warm and dry.  Psychiatric: Judgment and thought content normal. Her mood appears anxious. She is hyperactive. Thought content is not paranoid and not delusional. She expresses no homicidal and no suicidal ideation. She expresses no suicidal plans and no homicidal plans.  Pt tearful  Vitals reviewed.     BP 105/64   Pulse 80   Temp 97.3 F (36.3 C) (Oral)   Ht 5\' 7"  (1.702 m)   Wt 181 lb 12.8 oz (82.5 kg)   BMI 28.47 kg/m      Assessment & Plan:  1. GAD (generalized anxiety disorder) - Ambulatory referral to Psychiatry  2. Panic attack - Ambulatory referral to Psychiatry  3. Depression - Ambulatory referral to Psychiatry  Continue Trintellix, Wellbutrin, and xanax  Stress management discussed Referral to psychiatry, PT needs appt asap!!! Discussed if she has any thoughts of harming herself or others go to ED RTO prn and keep chronic follow up   Evelina Dun, FNP

## 2016-08-03 ENCOUNTER — Encounter (HOSPITAL_BASED_OUTPATIENT_CLINIC_OR_DEPARTMENT_OTHER): Payer: Self-pay | Admitting: *Deleted

## 2016-08-03 NOTE — Progress Notes (Signed)
NPO AFTER MN.  ARRIVE AT 0930.  NEEDS ISTAT.  CURRENT EKG IN CHART AND EPIC.  WILL TAKE AM MEDS AND INHALER DOS W/ SIPS OF WATER WITH EXCEPTION NO METFORMIN.

## 2016-08-10 ENCOUNTER — Encounter (HOSPITAL_BASED_OUTPATIENT_CLINIC_OR_DEPARTMENT_OTHER): Payer: Self-pay | Admitting: *Deleted

## 2016-08-10 NOTE — H&P (Signed)
History of Present Illness I was consulted by Dr Evelina Dun, NP regarding [name] [name]' urinary incontinence that has worsened over a number of years. She saw a urologist or gynecologist is in [place]. She describes a sling and hysterectomy 8 years ago. She describes a polyp in her left lower quadrant and saw her gynecologist afterwards and was cleared.  She has urge incontinence and high-volume bedwetting. She sometimes leaks with coughing and sneezing, but does leak with bending and lifting. The urge component is more severe. She wears 3 to 4 pads a day moderately wet.  When I saw [name] in April 2017, she had a large grade 2 cystocele and had failed oxybutynin and [name]. She has bipolar illness and anxiety. She sometimes can feel the cystocele when she is sitting. She has had a previous sling. She did not leak with a Valsalva pressure of 131 cmH2O but did have an overactive bladder. I felt she primarily had an overactive bladder and mild outlet abnormality and a mildly symptomatic cystocele. Cystocele repair would need to be based on treatment goals. For her incontinence I would recommend a refractory OAB therapy. She has tried [place], and she is here on VESIcare.  Frequency: Stable.  Urinalysis: Negative. Past Medical History Problems  1. History of Anxiety (F41.9) 2. History of High cholesterol (E78.00) 3. History of arthritis (Z87.39) 4. History of asthma (Z87.09) 5. History of depression (Z86.59) 6. History of diabetes mellitus (Z86.39) 7. History of esophageal reflux (Z87.19) 8. History of malignant neoplasm (Z85.9) Surgical History Problems  1. History of Breast Surgery 2. History of Hysterectomy Current Meds 1. Advair HFA AERO; Therapy: (Recorded:28Feb2017) to Recorded 2. Albuterol Sulfate (2.5 MG/3ML) 0.083% Inhalation Nebulization Solution; Therapy: (Recorded:28Feb2017) to Recorded 3. ALPRAZolam TABS; Therapy: (Recorded:28Feb2017) to Recorded 4. BuPROPion HCl  TABS; Therapy: (Recorded:28Feb2017) to Recorded 5. Claritin TABS; Therapy: (Recorded:28Feb2017) to Recorded 6. Cymbalta CPEP; Therapy: (Recorded:28Feb2017) to Recorded 7. Drisdol CAPS; Therapy: (Recorded:28Feb2017) to Recorded 8. Flonase 50 MCG/ACT Nasal Suspension; Therapy: (Recorded:28Feb2017) to Recorded 9. Lisinopril TABS; Therapy: (Recorded:28Feb2017) to Recorded 10. Meloxicam 15 MG Oral Tablet; Therapy: (Recorded:28Feb2017) to Recorded 11. MetFORMIN HCl TABS; Therapy: (Recorded:28Feb2017) to Recorded 12. Pravastatin Sodium TABS; Therapy: (Recorded:28Feb2017) to Recorded 13. [place]; Therapy: (Recorded:28Feb2017) to Recorded 14. Spiriva HandiHaler 18 MCG Inhalation Capsule; Therapy: (Recorded:28Feb2017) to Recorded 15. TraZODone HCl - 150 MG Oral Tablet; Therapy: (Recorded:28Feb2017) to Recorded Allergies Medication  1. Penicillins Family History Problems  1. Family history of diabetes mellitus (Z83.3) : Mother 2. Family history of malignant neoplasm (Z80.9) : Mother Social History Problems  1. Caffeine use (F15.90) 2. Former smoker [id number] 3. Married 4. Mother deceased 82. Patient's father is still living Vitals Vital Signs [Data Includes: Last 1 Day]  Recorded: 26Apr2017 03:33PM  Height: 5 ft 7 in Weight: 175 lb  BMI Calculated: 27.41 BSA Calculated: 1.91 Blood Pressure: 110 / 72 Temperature: 98 F Heart Rate: 73 Results/Data  Urine [Data Includes: Last 1 Day]   26Apr2017  COLOR YELLOW   APPEARANCE CLEAR   SPECIFIC GRAVITY 1.010   pH 5.5   GLUCOSE NEGATIVE   BILIRUBIN NEGATIVE   KETONE NEGATIVE   BLOOD NEGATIVE   PROTEIN NEGATIVE   NITRITE NEGATIVE   LEUKOCYTE ESTERASE NEGATIVE   Assessment Assessed  1. Increased urinary frequency (R35.0) 2. Urge and stress incontinence (N39.46) Plan Urge and stress incontinence  1. Follow-up Week x 2 Office Follow-up Status: Hold For - Date of Service Requested  for: [id number] Discussion/Summary VESIcare failed. She still has enuresis and  urge incontinence. She gets up 3 times a night. Nighttime symptoms are very bothersome.  I talked to her about Botox versus InterStim versus PTNS.  Pros, cons, success and failure rates of PTNS were discussed. Treatment protocol was reviewed. Risks were described but not limited to the risk of persistent, de novo, or worsening incontinence. Risks of pain, bruising, bleeding, infection, and neuropathy were discussed.  Pros, cons, success and failure rates of Interstim were discussed. We talked about the test stimulation (office/operating room) and the second stage procedure. Risks were described but not limited to the risk of persistent, de novo, or worsening incontinence. Risks of pain, bleeding, infection, and neuropathy were discussed. Risk of malfunction, migration, and breakage were discussed. Trouble-shooting, battery life, and the need for explanation and reoperation were discussed. MRI issues were discussed. The patient understands that she might not reach her treatment goal and that she might be worse following surgery. Pros, cons, success and failure rates of Botox were discussed. We talked about off-label usage, durability, and retreatment rates. Risks were described but not limited to the risk of persistent, de novo, or worsening incontinence. We talked about the risk of retention requiring catheterization. We talked about the risk of flow symptoms and high residual urine volumes. Risks of pain, bleeding, infection, and neuropathy were discussed. Rare risks of nerve paralysis and death were discussed. The patient understands that she might not reach her treatment goal and that she might be worse following surgery. I talked to her about the saphenous nerve study and likely the Botox study, and I mentioned the ankle study with eCoin. Handouts given. Reassess in 2 weeks. She asked me my opinion, and perhaps InterStim or  Botox specifically might be a little bit better with bedwetting.   After a thorough review of the management options for the patient's condition the patient  elected to proceed with surgical therapy as noted above. We have discussed the potential benefits and risks of the procedure, side effects of the proposed treatment, the likelihood of the patient achieving the goals of the procedure, and any potential problems that might occur during the procedure or recuperation. Informed consent has been obtained.

## 2016-08-11 ENCOUNTER — Ambulatory Visit (HOSPITAL_BASED_OUTPATIENT_CLINIC_OR_DEPARTMENT_OTHER)
Admission: RE | Admit: 2016-08-11 | Discharge: 2016-08-11 | Disposition: A | Discharge status: Home / Self Care | Payer: Medicaid Other | Source: Ambulatory Visit | Attending: Urology | Admitting: Urology

## 2016-08-11 ENCOUNTER — Ambulatory Visit (HOSPITAL_BASED_OUTPATIENT_CLINIC_OR_DEPARTMENT_OTHER): Payer: Medicaid Other | Admitting: Anesthesiology

## 2016-08-11 ENCOUNTER — Encounter (HOSPITAL_BASED_OUTPATIENT_CLINIC_OR_DEPARTMENT_OTHER)
Admission: RE | Disposition: A | Discharge status: Home / Self Care | Payer: Self-pay | Source: Ambulatory Visit | Attending: Urology

## 2016-08-11 ENCOUNTER — Ambulatory Visit (HOSPITAL_COMMUNITY): Payer: Medicaid Other

## 2016-08-11 ENCOUNTER — Encounter (HOSPITAL_BASED_OUTPATIENT_CLINIC_OR_DEPARTMENT_OTHER): Payer: Self-pay | Admitting: *Deleted

## 2016-08-11 DIAGNOSIS — M1990 Unspecified osteoarthritis, unspecified site: Secondary | ICD-10-CM | POA: Insufficient documentation

## 2016-08-11 DIAGNOSIS — E119 Type 2 diabetes mellitus without complications: Secondary | ICD-10-CM | POA: Insufficient documentation

## 2016-08-11 DIAGNOSIS — Z469 Encounter for fitting and adjustment of unspecified device: Secondary | ICD-10-CM

## 2016-08-11 DIAGNOSIS — J449 Chronic obstructive pulmonary disease, unspecified: Secondary | ICD-10-CM | POA: Diagnosis not present

## 2016-08-11 DIAGNOSIS — F329 Major depressive disorder, single episode, unspecified: Secondary | ICD-10-CM | POA: Insufficient documentation

## 2016-08-11 DIAGNOSIS — N3946 Mixed incontinence: Secondary | ICD-10-CM | POA: Diagnosis not present

## 2016-08-11 DIAGNOSIS — N8110 Cystocele, unspecified: Secondary | ICD-10-CM | POA: Diagnosis not present

## 2016-08-11 DIAGNOSIS — Z7984 Long term (current) use of oral hypoglycemic drugs: Secondary | ICD-10-CM | POA: Insufficient documentation

## 2016-08-11 DIAGNOSIS — Z79899 Other long term (current) drug therapy: Secondary | ICD-10-CM | POA: Insufficient documentation

## 2016-08-11 DIAGNOSIS — Z87891 Personal history of nicotine dependence: Secondary | ICD-10-CM | POA: Diagnosis not present

## 2016-08-11 DIAGNOSIS — F419 Anxiety disorder, unspecified: Secondary | ICD-10-CM | POA: Diagnosis not present

## 2016-08-11 DIAGNOSIS — Z7951 Long term (current) use of inhaled steroids: Secondary | ICD-10-CM | POA: Insufficient documentation

## 2016-08-11 DIAGNOSIS — R32 Unspecified urinary incontinence: Secondary | ICD-10-CM | POA: Diagnosis present

## 2016-08-11 DIAGNOSIS — Z791 Long term (current) use of non-steroidal anti-inflammatories (NSAID): Secondary | ICD-10-CM | POA: Diagnosis not present

## 2016-08-11 DIAGNOSIS — K219 Gastro-esophageal reflux disease without esophagitis: Secondary | ICD-10-CM | POA: Insufficient documentation

## 2016-08-11 HISTORY — DX: Personal history of other diseases of the digestive system: Z87.19

## 2016-08-11 HISTORY — DX: Unspecified hemorrhoids: K64.9

## 2016-08-11 HISTORY — DX: Parageusia: R43.2

## 2016-08-11 HISTORY — DX: Polyneuropathy, unspecified: G62.9

## 2016-08-11 HISTORY — DX: Personal history of irradiation: Z92.3

## 2016-08-11 HISTORY — DX: Unspecified temporomandibular joint disorder, unspecified side: M26.609

## 2016-08-11 HISTORY — DX: Personal history of other diseases of the nervous system and sense organs: Z86.69

## 2016-08-11 HISTORY — PX: INTERSTIM IMPLANT PLACEMENT: SHX5130

## 2016-08-11 HISTORY — DX: Personal history of other mental and behavioral disorders: Z86.59

## 2016-08-11 HISTORY — DX: Mixed incontinence: N39.46

## 2016-08-11 HISTORY — DX: Personal history of malignant neoplasm of breast: Z85.3

## 2016-08-11 HISTORY — DX: Other specified postprocedural states: Z98.890

## 2016-08-11 HISTORY — DX: Personal history of adenomatous and serrated colon polyps: Z86.0101

## 2016-08-11 HISTORY — DX: Cervicalgia: M54.2

## 2016-08-11 HISTORY — DX: Dry mouth, unspecified: R68.2

## 2016-08-11 HISTORY — DX: Personal history of colonic polyps: Z86.010

## 2016-08-11 HISTORY — DX: Complete loss of teeth due to other specified cause, unspecified class: K08.199

## 2016-08-11 HISTORY — DX: Type 2 diabetes mellitus without complications: E11.9

## 2016-08-11 HISTORY — DX: Presence of spectacles and contact lenses: Z97.3

## 2016-08-11 HISTORY — DX: Reserved for concepts with insufficient information to code with codable children: IMO0002

## 2016-08-11 HISTORY — DX: Malignant neoplasm of submandibular gland: C08.0

## 2016-08-11 HISTORY — DX: Generalized anxiety disorder: F41.1

## 2016-08-11 LAB — GLUCOSE, CAPILLARY: Glucose-Capillary: 176 mg/dL — ABNORMAL HIGH (ref 65–99)

## 2016-08-11 LAB — POCT I-STAT, CHEM 8
BUN: 4 mg/dL — ABNORMAL LOW (ref 6–20)
CHLORIDE: 100 mmol/L — AB (ref 101–111)
Calcium, Ion: 1.24 mmol/L — ABNORMAL HIGH (ref 1.12–1.23)
Creatinine, Ser: 0.8 mg/dL (ref 0.44–1.00)
Glucose, Bld: 145 mg/dL — ABNORMAL HIGH (ref 65–99)
HCT: 40 % (ref 36.0–46.0)
Hemoglobin: 13.6 g/dL (ref 12.0–15.0)
POTASSIUM: 4.1 mmol/L (ref 3.5–5.1)
SODIUM: 143 mmol/L (ref 135–145)
TCO2: 27 mmol/L (ref 0–100)

## 2016-08-11 SURGERY — INSERTION, SACRAL NERVE STIMULATOR, INTERSTIM, STAGE 1
Anesthesia: Monitor Anesthesia Care | Site: Buttocks

## 2016-08-11 MED ORDER — FENTANYL CITRATE (PF) 100 MCG/2ML IJ SOLN
INTRAMUSCULAR | Status: AC
  Filled 2016-08-11: qty 2

## 2016-08-11 MED ORDER — PROMETHAZINE HCL 25 MG/ML IJ SOLN
6.2500 mg | INTRAMUSCULAR | Status: DC | PRN
  Filled 2016-08-11: qty 1

## 2016-08-11 MED ORDER — GLYCOPYRROLATE 0.2 MG/ML IJ SOLN
INTRAMUSCULAR | Status: DC | PRN
  Administered 2016-08-11: 0.2 mg via INTRAVENOUS

## 2016-08-11 MED ORDER — HYDROCODONE-ACETAMINOPHEN 5-325 MG PO TABS
ORAL_TABLET | ORAL | Status: AC
  Filled 2016-08-11: qty 1

## 2016-08-11 MED ORDER — HYDROCODONE-ACETAMINOPHEN 5-325 MG PO TABS
1.0000 | ORAL_TABLET | Freq: Four times a day (QID) | ORAL | Status: DC | PRN
  Administered 2016-08-11 (×2): 1 via ORAL
  Filled 2016-08-11: qty 2

## 2016-08-11 MED ORDER — KETAMINE HCL 100 MG/ML IJ SOLN
INTRAMUSCULAR | Status: DC | PRN
  Administered 2016-08-11: 9 ug/kg/min via INTRAVENOUS

## 2016-08-11 MED ORDER — GLYCOPYRROLATE 0.2 MG/ML IJ SOLN
INTRAMUSCULAR | Status: AC
  Filled 2016-08-11: qty 1

## 2016-08-11 MED ORDER — STERILE WATER FOR IRRIGATION IR SOLN
Status: DC | PRN
  Administered 2016-08-11: 500 mL

## 2016-08-11 MED ORDER — PROPOFOL 500 MG/50ML IV EMUL
INTRAVENOUS | Status: AC
  Filled 2016-08-11: qty 50

## 2016-08-11 MED ORDER — VANCOMYCIN HCL 500 MG IV SOLR
INTRAVENOUS | Status: AC
  Filled 2016-08-11: qty 500

## 2016-08-11 MED ORDER — FENTANYL CITRATE (PF) 100 MCG/2ML IJ SOLN
INTRAMUSCULAR | Status: DC | PRN
  Administered 2016-08-11: 50 ug via INTRAVENOUS

## 2016-08-11 MED ORDER — FENTANYL CITRATE (PF) 100 MCG/2ML IJ SOLN
25.0000 ug | INTRAMUSCULAR | Status: DC | PRN
  Administered 2016-08-11 (×2): 25 ug via INTRAVENOUS
  Filled 2016-08-11: qty 1

## 2016-08-11 MED ORDER — KETAMINE HCL 10 MG/ML IJ SOLN
INTRAMUSCULAR | Status: AC
  Filled 2016-08-11: qty 1

## 2016-08-11 MED ORDER — HYDROCODONE-ACETAMINOPHEN 5-325 MG PO TABS: 1.0000 | ORAL_TABLET | Freq: Four times a day (QID) | ORAL | 0 refills | Status: DC | PRN

## 2016-08-11 MED ORDER — ONDANSETRON HCL 4 MG/2ML IJ SOLN
INTRAMUSCULAR | Status: AC
  Filled 2016-08-11: qty 2

## 2016-08-11 MED ORDER — MIDAZOLAM HCL 5 MG/5ML IJ SOLN
INTRAMUSCULAR | Status: DC | PRN
  Administered 2016-08-11: 0.5 mg via INTRAVENOUS
  Administered 2016-08-11: 1 mg via INTRAVENOUS
  Administered 2016-08-11: 0.5 mg via INTRAVENOUS

## 2016-08-11 MED ORDER — ONDANSETRON HCL 4 MG/2ML IJ SOLN
INTRAMUSCULAR | Status: DC | PRN
  Administered 2016-08-11: 4 mg via INTRAVENOUS

## 2016-08-11 MED ORDER — VANCOMYCIN HCL IN DEXTROSE 1-5 GM/200ML-% IV SOLN
1000.0000 mg | INTRAVENOUS | Status: AC
  Administered 2016-08-11: 1000 mg via INTRAVENOUS
  Filled 2016-08-11 (×2): qty 200

## 2016-08-11 MED ORDER — SODIUM CHLORIDE 0.9 % IV SOLN
INTRAVENOUS | Status: AC
  Filled 2016-08-11: qty 100

## 2016-08-11 MED ORDER — MIDAZOLAM HCL 2 MG/2ML IJ SOLN
INTRAMUSCULAR | Status: AC
  Filled 2016-08-11: qty 2

## 2016-08-11 MED ORDER — LACTATED RINGERS IV SOLN
INTRAVENOUS | Status: DC
  Administered 2016-08-11 (×2): via INTRAVENOUS
  Filled 2016-08-11: qty 1000

## 2016-08-11 MED ORDER — PROPOFOL 10 MG/ML IV BOLUS
INTRAVENOUS | Status: AC
  Filled 2016-08-11: qty 20

## 2016-08-11 MED ORDER — PROPOFOL 500 MG/50ML IV EMUL
INTRAVENOUS | Status: DC | PRN
  Administered 2016-08-11: 200 ug/kg/min via INTRAVENOUS

## 2016-08-11 SURGICAL SUPPLY — 53 items
ANTNA NRSTM XTRN TELEM NS LF (UROLOGICAL SUPPLIES) ×1
BLADE HEX COATED 2.75 (ELECTRODE) ×2 IMPLANT
BLADE SURG 15 STRL LF DISP TIS (BLADE) ×1 IMPLANT
BLADE SURG 15 STRL SS (BLADE) ×2
CABLE TEST STIMULATION (UROLOGICAL SUPPLIES) ×1 IMPLANT
CLOTH BEACON ORANGE TIMEOUT ST (SAFETY) ×2 IMPLANT
COVER BACK TABLE 60X90IN (DRAPES) ×2 IMPLANT
COVER MAYO STAND STRL (DRAPES) ×2 IMPLANT
COVER PROBE W GEL 5X96 (DRAPES) ×2 IMPLANT
DRAPE C-ARM 42X72 X-RAY (DRAPES) ×3 IMPLANT
DRAPE INCISE IOBAN 66X45 STRL (DRAPES) ×2 IMPLANT
DRAPE LAPAROSCOPIC ABDOMINAL (DRAPES) ×2 IMPLANT
DRAPE LG THREE QUARTER DISP (DRAPES) ×2 IMPLANT
DRSG TEGADERM 2-3/8X2-3/4 SM (GAUZE/BANDAGES/DRESSINGS) ×2 IMPLANT
DRSG TEGADERM 4X4.75 (GAUZE/BANDAGES/DRESSINGS) ×2 IMPLANT
DRSG TELFA 3X8 NADH (GAUZE/BANDAGES/DRESSINGS) ×2 IMPLANT
ELECT REM PT RETURN 9FT ADLT (ELECTROSURGICAL) ×2
ELECTRODE REM PT RTRN 9FT ADLT (ELECTROSURGICAL) ×1 IMPLANT
GLOVE BIO SURGEON STRL SZ7.5 (GLOVE) ×3 IMPLANT
GOWN STRL REUS W/ TWL LRG LVL3 (GOWN DISPOSABLE) ×1 IMPLANT
GOWN STRL REUS W/ TWL XL LVL3 (GOWN DISPOSABLE) ×1 IMPLANT
GOWN STRL REUS W/TWL LRG LVL3 (GOWN DISPOSABLE) ×2
GOWN STRL REUS W/TWL XL LVL3 (GOWN DISPOSABLE) ×2
HOLDER FOLEY CATH W/STRAP (MISCELLANEOUS) ×1 IMPLANT
INTRODUCER GUIDE DILATR SHEATH (SET/KITS/TRAYS/PACK) ×2 IMPLANT
KIT INTERSTIM LEAD TINED 28CM (Urological Implant) ×1 IMPLANT
KIT ROOM TURNOVER WOR (KITS) ×2 IMPLANT
LIQUID BAND (GAUZE/BANDAGES/DRESSINGS) ×2 IMPLANT
NEEDLE FORAMEN 20GA 3.5  9CM (NEEDLE) IMPLANT
NEEDLE FORAMEN 20GA 5  12.5CM (NEEDLE) IMPLANT
NEEDLE HYPO 22GX1.5 SAFETY (NEEDLE) ×2 IMPLANT
PACK BASIN DAY SURGERY FS (CUSTOM PROCEDURE TRAY) ×2 IMPLANT
PAD DRESSING TELFA 3X8 NADH (GAUZE/BANDAGES/DRESSINGS) ×1 IMPLANT
PENCIL BUTTON HOLSTER BLD 10FT (ELECTRODE) ×2 IMPLANT
PROGRAMMER ANTENNA EXT (UROLOGICAL SUPPLIES) ×2 IMPLANT
PROGRAMMER STIMUL 2.2X1.1X3.7 (UROLOGICAL SUPPLIES) ×2 IMPLANT
SPONGE GAUZE 4X4 12PLY STER LF (GAUZE/BANDAGES/DRESSINGS) IMPLANT
STIMULATOR INTERSTIM 2X1.7X.3 (Orthopedic Implant) ×2 IMPLANT
STRIP CLOSURE SKIN 1/2X4 (GAUZE/BANDAGES/DRESSINGS) ×2 IMPLANT
SUCTION FRAZIER HANDLE 10FR (MISCELLANEOUS) ×1
SUCTION TUBE FRAZIER 10FR DISP (MISCELLANEOUS) ×1 IMPLANT
SUT SILK 2 0 (SUTURE) ×2
SUT SILK 2-0 18XBRD TIE 12 (SUTURE) ×1 IMPLANT
SUT VIC AB 3-0 SH 27 (SUTURE) ×4
SUT VIC AB 3-0 SH 27X BRD (SUTURE) ×2 IMPLANT
SUT VICRYL 4-0 PS2 18IN ABS (SUTURE) ×4 IMPLANT
SYR BULB IRRIGATION 50ML (SYRINGE) ×2 IMPLANT
SYR CONTROL 10ML LL (SYRINGE) ×2 IMPLANT
SYRINGE 10CC LL (SYRINGE) ×2 IMPLANT
TOWEL OR 17X24 6PK STRL BLUE (TOWEL DISPOSABLE) ×4 IMPLANT
TRAY DSU PREP LF (CUSTOM PROCEDURE TRAY) ×2 IMPLANT
TUBE CONNECTING 12X1/4 (SUCTIONS) ×2 IMPLANT
WATER STERILE IRR 500ML POUR (IV SOLUTION) ×2 IMPLANT

## 2016-08-11 NOTE — Op Note (Signed)
Operative Note:  Pre-operative Diagnosis: Refractory urge incontinence  Post-operative Diagnosis: Same  Procedure and Anesthesia:  Procedure(s): INTERSTIM IMPLANT FIRST STAGE INTERSTIM IMPLANT SECOND STAGE IMPEDANCE CHECK  Surgeon: Bjorn Loser, MD  Resident:  Karolee Stamps  EBL: minimal  IVF: See anesthesia record  UOP: See anesthesia record  Drains: none  Implants:   Implant Name Type Inv. Item Serial No. Manufacturer Lot No. LRB No. Used  Thomes Dinning - [id number] H Orthopedic Implant STIMULATOR INTERSTIM [id number] H [employer] SURGICAL NAVIGATION [id number] H N/A 1  Quadripolar lead       [employer] RHYTHM MANAGEMENT [id number] N/A 1   Specimens: None  Complications: None  Indications for Surgery: [name]  has presented today for surgery, with the diagnosis of urge incontinence.  The various methods of treatment have been discussed with the patient and family. After consideration of risks, benefits and other options for treatment, the patient has consented to Interstim placement.  Findings:  - Excellent bellows and toe dorsiflexion seen following lead placement  Procedure Details: The patient and consent was verified in the pre-op holding area and brought to the operating room where they were placed on the operating table in prone position. SCDs were placed and IV antibiotics were started (intravenous vancomycin). Pillows were placed under lower abdomen to flatten sacrum and under shins to allow the toes to dangle freely. A ground pad was placed on the bottom of the patient's foot and the long test stimulation cable was connected to the ground pad and the Model [id number] external test stimulator. Patient was prepped and draped in usual sterile fashion. Time out was performed.  The sciatic notches and sacral midline were identified via palpation. The level of S3 was identified by measuring 9cm from the tip of the coccyx. Local injection of lidocaine with epinephrine  was administered at foramen needle entry point located 2cm left lateral to the sacral midline and 2cm cephalad of sciatic notch level.  A 3.5 inch foramen needle was introduced at an approximate 60 degree angle, feeling for the foraminal margins. We tested the needle position but had poor bellows response. We then moved to the contralateral (right) side. We had an improved bellow response but also noted response in the calf. Thus, we felt this was the S2 foramen and marched down caudal until S3 was identified. Proper needle position was confirmed by the patient identifying location of stimulation sensation; and direct observation of the lifting of the perineum or "bellowing," and plantar flexion of the great toe utilizing the mini-hook patient cable and the external test stimulator.  The foramen needle stylet was removed and a guidewire for the insertion sheath was inserted to the appropriate depth under fluoroscopy. The incision was widened slightly with a 15 blade. The sheath was then inserted over the guidewire and the guidewire was removed under fluoroscopy. The lead was then inserted through the sheath under fluoroscopic guidance with all 4 leads in appropriate position curving caudally. The introducer sheath was then removed. The lead was tested and was functioning appropriately with good bellows and dorsiflexion response.   We made a counterincision measuring 5cm lateral to the lead but below the hip. We dissected out a pocket of fat approximately 1cm below the skin surface. We then tunneled the lead under the skin to this incision using the trocar through the fat plane. The lead was attached to the battery pack and tightened with the screw to secure it in place. We then placed this into the pocket we had created and  tested the impendence. We closed the subcutaneous layer with a running 3-0 Vicryl suture. The skin was closed with a running 4-0 Vicryl subcuticular suture. The small lead placement  incision was closed with interrupted 3-0 Vicryl sutures. The incisions were then covered with liquid bandage. All counts were correct. Post procedure, the patient was programmed with the external test stimulator to optimum sensation via the lead and provided utilization instructions prior to discharge.  Teaching Physician Attestation: Dr. Matilde Sprang was present and scrubbed for the entirety of the procedure

## 2016-08-11 NOTE — Transfer of Care (Signed)
Immediate Anesthesia Transfer of Care Note  Patient: [name]  Procedure(s) Performed: Procedure(s) (LRB): INTERSTIM IMPLANT FIRST STAGE (N/A) INTERSTIM IMPLANT SECOND STAGE IMPEDANCE CHECK (N/A)  Patient Location: PACU  Anesthesia Type: MAC  Level of Consciousness: awake, alert , oriented and patient cooperative  Airway & Oxygen Therapy: Patient Spontanous Breathing and Patient connected to face mask oxygen  Post-op Assessment: Report given to PACU RN and Post -op Vital signs reviewed and stable  Post vital signs: Reviewed and stable  Complications: No apparent anesthesia complications

## 2016-08-11 NOTE — Discharge Instructions (Signed)
°  Post Anesthesia Home Care Instructions  Activity: Get plenty of rest for the remainder of the day. A responsible adult should stay with you for 24 hours following the procedure.  For the next 24 hours, DO NOT: -Drive a car -Paediatric nurse -Drink alcoholic beverages -Take any medication unless instructed by your physician -Make any legal decisions or sign important papers.  Meals: Start with liquid foods such as gelatin or soup. Progress to regular foods as tolerated. Avoid greasy, spicy, heavy foods. If nausea and/or vomiting occur, drink only clear liquids until the nausea and/or vomiting subsides. Call your physician if vomiting continues.  Special Instructions/Symptoms: Your throat may feel dry or sore from the anesthesia or the breathing tube placed in your throat during surgery. If this causes discomfort, gargle with warm salt water. The discomfort should disappear within 24 hours.  If you had a scopolamine patch placed behind your ear for the management of post- operative nausea and/or vomiting:  1. The medication in the patch is effective for 72 hours, after which it should be removed.  Wrap patch in a tissue and discard in the trash. Wash hands thoroughly with soap and water. 2. You may remove the patch earlier than 72 hours if you experience unpleasant side effects which may include dry mouth, dizziness or visual disturbances. 3. Avoid touching the patch. Wash your hands with soap and water after contact with the patch.   1.  Activity:  You are encouraged to ambulate frequently (about every hour during waking hours) to help prevent blood clots from forming in your legs or lungs.   2. Prescriptions:  You will be provided a prescription for pain medication to take as needed.  If your pain is not severe enough to require the prescription pain medication, you may take extra strength Tylenol instead which will have less side effects.  You should also take a prescribed stool softener  to avoid straining with bowel movements as the prescription pain medication may constipate you. 3. Incisions: You may remove your dressing bandages 48 hours after surgery if not removed in the hospital.  You will have some small staples or special tissue glue at each of the incision sites. Once the bandages are removed (if present), the incisions may stay open to air.  You may start showering (but not soaking or bathing in water) the 2nd day after surgery and the incisions simply need to be patted dry after the shower.  No additional care is needed. 4. What to call us about: You should call the office [phone]) if you develop fever > 101 or develop persistent vomiting.  Call your surgeon if you experience:   1.  Fever over 101.0. 2.  Inability to urinate. 3.  Nausea and/or vomiting. 4.  Extreme swelling or bruising at the surgical site. 5.  Continued bleeding from the incision. 6.  Increased pain, redness or drainage from the incision. 7.  Problems related to your pain medication. 8.  Any problems and/or concerns

## 2016-08-11 NOTE — Anesthesia Preprocedure Evaluation (Signed)
Anesthesia Evaluation  Patient identified by MRN, date of birth, ID band Patient awake    Reviewed: Allergy & Precautions, NPO status , Patient's Chart, lab work & pertinent test results  Airway Mallampati: I  TM Distance: >3 FB     Dental  (+) Teeth Intact, Missing   Pulmonary COPD,  COPD inhaler, former smoker,    breath sounds clear to auscultation       Cardiovascular  Rhythm:Regular Rate:Normal     Neuro/Psych  Headaches, PSYCHIATRIC DISORDERS Anxiety Depression Bipolar Disorder    GI/Hepatic hiatal hernia, GERD  ,  Endo/Other  diabetes, Type 2, Oral Hypoglycemic Agents  Renal/GU      Musculoskeletal  (+) Arthritis  (TMJ),   Abdominal   Peds  Hematology   Anesthesia Other Findings History of submandibular cancer s/p radiation    Reproductive/Obstetrics                             Anesthesia Physical  Anesthesia Plan  ASA: III  Anesthesia Plan: MAC   Post-op Pain Management:    Induction: Intravenous  Airway Management Planned: Simple Face Mask  Additional Equipment:   Intra-op Plan:   Post-operative Plan:   Informed Consent: I have reviewed the patients History and Physical, chart, labs and discussed the procedure including the risks, benefits and alternatives for the proposed anesthesia with the patient or authorized representative who has indicated his/her understanding and acceptance.     Plan Discussed with:   Anesthesia Plan Comments:         Anesthesia Quick Evaluation

## 2016-08-11 NOTE — Interval H&P Note (Signed)
History and Physical Interval Note:  08/11/2016 10:51 AM  [name]  has presented today for surgery, with the diagnosis of URGED INCONTINENCE  The various methods of treatment have been discussed with the patient and family. After consideration of risks, benefits and other options for treatment, the patient has consented to  Procedure(s): INTERSTIM IMPLANT FIRST STAGE (N/A) INTERSTIM IMPLANT SECOND STAGE IMPEDANCE CHECK (N/A) as a surgical intervention .  The patient's history has been reviewed, patient examined, no change in status, stable for surgery.  I have reviewed the patient's chart and labs.  Questions were answered to the patient's satisfaction.     Ishaq Maffei A

## 2016-08-12 ENCOUNTER — Encounter (HOSPITAL_BASED_OUTPATIENT_CLINIC_OR_DEPARTMENT_OTHER): Payer: Self-pay | Admitting: Urology

## 2016-08-12 NOTE — Anesthesia Postprocedure Evaluation (Signed)
Anesthesia Post Note  Patient: [name]  Procedure(s) Performed: Procedure(s) (LRB): INTERSTIM IMPLANT FIRST STAGE (N/A) INTERSTIM IMPLANT SECOND STAGE IMPEDANCE CHECK (N/A)  Patient location during evaluation: PACU Anesthesia Type: MAC Level of consciousness: awake and alert Pain management: pain level controlled Vital Signs Assessment: post-procedure vital signs reviewed and stable Respiratory status: spontaneous breathing, nonlabored ventilation, respiratory function stable and patient connected to nasal cannula oxygen Cardiovascular status: stable and blood pressure returned to baseline Anesthetic complications: no    Last Vitals:  Vitals:   08/11/16 1445 08/11/16 1705  BP: 125/73 (!) 168/90  Pulse: 78 60  Resp: 17 16  Temp:  36.8 C    Last Pain:  Vitals:   08/12/16 1120  TempSrc:   PainSc: 5                  Tezra Mahr Minda Meo

## 2016-08-13 ENCOUNTER — Other Ambulatory Visit: Payer: Self-pay | Admitting: Family

## 2016-08-28 ENCOUNTER — Telehealth (HOSPITAL_COMMUNITY): Payer: Self-pay | Admitting: *Deleted

## 2016-08-28 NOTE — Telephone Encounter (Signed)
left voice message regarding appointment. 

## 2016-09-01 ENCOUNTER — Telehealth (HOSPITAL_COMMUNITY): Payer: Self-pay | Admitting: *Deleted

## 2016-09-01 ENCOUNTER — Telehealth: Payer: Self-pay | Admitting: Family

## 2016-09-01 NOTE — Telephone Encounter (Signed)
Spoke with pt regarding referral to pschiatry She verbalizes understanding She will call to schedule appt

## 2016-09-01 NOTE — Telephone Encounter (Signed)
phone call, spoke with patient and she wanted to know why her doctor referred her here.   She said no one spoke with her regarding a referral.

## 2016-09-16 ENCOUNTER — Telehealth (HOSPITAL_COMMUNITY): Payer: Self-pay | Admitting: *Deleted

## 2016-09-16 NOTE — Telephone Encounter (Signed)
Opened in Error.

## 2016-10-05 ENCOUNTER — Ambulatory Visit (INDEPENDENT_AMBULATORY_CARE_PROVIDER_SITE_OTHER): Payer: Medicaid Other | Admitting: Family

## 2016-10-05 ENCOUNTER — Encounter: Payer: Self-pay | Admitting: Family

## 2016-10-05 VITALS — BP 91/60 | HR 96 | Temp 97.7°F | Ht 67.0 in | Wt 178.0 lb

## 2016-10-05 DIAGNOSIS — G4700 Insomnia, unspecified: Secondary | ICD-10-CM | POA: Diagnosis not present

## 2016-10-05 DIAGNOSIS — Z23 Encounter for immunization: Secondary | ICD-10-CM

## 2016-10-05 DIAGNOSIS — J449 Chronic obstructive pulmonary disease, unspecified: Secondary | ICD-10-CM

## 2016-10-05 DIAGNOSIS — E119 Type 2 diabetes mellitus without complications: Secondary | ICD-10-CM | POA: Diagnosis not present

## 2016-10-05 DIAGNOSIS — J301 Allergic rhinitis due to pollen: Secondary | ICD-10-CM

## 2016-10-05 DIAGNOSIS — E559 Vitamin D deficiency, unspecified: Secondary | ICD-10-CM | POA: Diagnosis not present

## 2016-10-05 DIAGNOSIS — K219 Gastro-esophageal reflux disease without esophagitis: Secondary | ICD-10-CM | POA: Diagnosis not present

## 2016-10-05 DIAGNOSIS — F411 Generalized anxiety disorder: Secondary | ICD-10-CM

## 2016-10-05 DIAGNOSIS — E782 Mixed hyperlipidemia: Secondary | ICD-10-CM

## 2016-10-05 DIAGNOSIS — F329 Major depressive disorder, single episode, unspecified: Secondary | ICD-10-CM

## 2016-10-05 DIAGNOSIS — N3946 Mixed incontinence: Secondary | ICD-10-CM | POA: Diagnosis not present

## 2016-10-05 DIAGNOSIS — K5900 Constipation, unspecified: Secondary | ICD-10-CM | POA: Diagnosis not present

## 2016-10-05 DIAGNOSIS — F32A Depression, unspecified: Secondary | ICD-10-CM

## 2016-10-05 LAB — BAYER DCA HB A1C WAIVED: HB A1C: 5.4 % (ref <7.0)

## 2016-10-05 MED ORDER — MIRABEGRON ER 50 MG PO TB24: 50.0000 mg | ORAL_TABLET | Freq: Every day | ORAL | 1 refills | Status: DC

## 2016-10-05 MED ORDER — LORATADINE 10 MG PO TABS: 10.0000 mg | ORAL_TABLET | Freq: Every day | ORAL | 1 refills | Status: DC

## 2016-10-05 MED ORDER — FLUTICASONE PROPIONATE 50 MCG/ACT NA SUSP: NASAL | 5 refills | Status: DC

## 2016-10-05 MED ORDER — DULOXETINE HCL 60 MG PO CPEP: 60.0000 mg | ORAL_CAPSULE | Freq: Every day | ORAL | 1 refills | Status: DC

## 2016-10-05 MED ORDER — TRAZODONE HCL 150 MG PO TABS: 150.0000 mg | ORAL_TABLET | Freq: Every day | ORAL | 1 refills | Status: DC

## 2016-10-05 MED ORDER — BUPROPION HCL ER (XL) 300 MG PO TB24: 300.0000 mg | ORAL_TABLET | Freq: Every morning | ORAL | 1 refills | Status: DC

## 2016-10-05 MED ORDER — OMEPRAZOLE 40 MG PO CPDR: DELAYED_RELEASE_CAPSULE | ORAL | 1 refills | Status: DC

## 2016-10-05 MED ORDER — METFORMIN HCL 1000 MG PO TABS: 1000.0000 mg | ORAL_TABLET | Freq: Two times a day (BID) | ORAL | 0 refills | Status: DC

## 2016-10-05 MED ORDER — TIOTROPIUM BROMIDE MONOHYDRATE 18 MCG IN CAPS: 18.0000 ug | ORAL_CAPSULE | Freq: Every day | RESPIRATORY_TRACT | 1 refills | Status: DC

## 2016-10-05 MED ORDER — FLUTICASONE-SALMETEROL 250-50 MCG/DOSE IN AEPB: 1.0000 | INHALATION_SPRAY | Freq: Two times a day (BID) | RESPIRATORY_TRACT | 3 refills | Status: DC

## 2016-10-05 NOTE — Patient Instructions (Signed)
Stress and Stress Management Stress is a normal reaction to life events. It is what you feel when life demands more than you are used to or more than you can handle. Some stress can be useful. For example, the stress reaction can help you catch the last bus of the day, study for a test, or meet a deadline at work. But stress that occurs too often or for too long can cause problems. It can affect your emotional health and interfere with relationships and normal daily activities. Too much stress can weaken your immune system and increase your risk for physical illness. If you already have a medical problem, stress can make it worse. CAUSES  All sorts of life events may cause stress. An event that causes stress for one person may not be stressful for another person. Major life events commonly cause stress. These may be positive or negative. Examples include losing your job, moving into a new home, getting married, having a baby, or losing a loved one. Less obvious life events may also cause stress, especially if they occur day after day or in combination. Examples include working long hours, driving in traffic, caring for children, being in debt, or being in a difficult relationship. SIGNS AND SYMPTOMS Stress may cause emotional symptoms including, the following:  Anxiety. This is feeling worried, afraid, on edge, overwhelmed, or out of control.  Anger. This is feeling irritated or impatient.  Depression. This is feeling sad, down, helpless, or guilty.  Difficulty focusing, remembering, or making decisions. Stress may cause physical symptoms, including the following:   Aches and pains. These may affect your head, neck, back, stomach, or other areas of your body.  Tight muscles or clenched jaw.  Low energy or trouble sleeping. Stress may cause unhealthy behaviors, including the following:   Eating to feel better (overeating) or skipping meals.  Sleeping too little, too much, or both.  Working  too much or putting off tasks (procrastination).  Smoking, drinking alcohol, or using drugs to feel better. DIAGNOSIS  Stress is diagnosed through an assessment by your health care provider. Your health care provider will ask questions about your symptoms and any stressful life events.Your health care provider will also ask about your medical history and may order blood tests or other tests. Certain medical conditions and medicine can cause physical symptoms similar to stress. Mental illness can cause emotional symptoms and unhealthy behaviors similar to stress. Your health care provider may refer you to a mental health professional for further evaluation.  TREATMENT  Stress management is the recommended treatment for stress.The goals of stress management are reducing stressful life events and coping with stress in healthy ways.  Techniques for reducing stressful life events include the following:  Stress identification. Self-monitor for stress and identify what causes stress for you. These skills may help you to avoid some stressful events.  Time management. Set your priorities, keep a calendar of events, and learn to say "no." These tools can help you avoid making too many commitments. Techniques for coping with stress include the following:  Rethinking the problem. Try to think realistically about stressful events rather than ignoring them or overreacting. Try to find the positives in a stressful situation rather than focusing on the negatives.  Exercise. Physical exercise can release both physical and emotional tension. The key is to find a form of exercise you enjoy and do it regularly.  Relaxation techniques. These relax the body and mind. Examples include yoga, meditation, tai chi, biofeedback, deep  breathing, progressive muscle relaxation, listening to music, being out in nature, journaling, and other hobbies. Again, the key is to find one or more that you enjoy and can do  regularly.  Healthy lifestyle. Eat a balanced diet, get plenty of sleep, and do not smoke. Avoid using alcohol or drugs to relax.  Strong support network. Spend time with family, friends, or other people you enjoy being around.Express your feelings and talk things over with someone you trust. Counseling or talktherapy with a mental health professional may be helpful if you are having difficulty managing stress on your own. Medicine is typically not recommended for the treatment of stress.Talk to your health care provider if you think you need medicine for symptoms of stress. HOME CARE INSTRUCTIONS  Keep all follow-up visits as directed by your health care provider.  Take all medicines as directed by your health care provider. SEEK MEDICAL CARE IF:  Your symptoms get worse or you start having new symptoms.  You feel overwhelmed by your problems and can no longer manage them on your own. SEEK IMMEDIATE MEDICAL CARE IF:  You feel like hurting yourself or someone else.   This information is not intended to replace advice given to you by your health care provider. Make sure you discuss any questions you have with your health care provider.   Document Released: 06/02/2001 Document Revised: 12/28/2014 Document Reviewed: 08/01/2013 Elsevier Interactive Patient Education 2016 Else[employer] Inc[employer]  

## 2016-10-05 NOTE — Progress Notes (Signed)
Subjective:    Patient ID: [name], female    DOB: 02/12/1956, 60 y.o.   MRN: [id number]  Pt presents to the office today for chronic follow up. Pt history of Left submandibular cancer. Pt has appt with Psychologists tomorrow. Encouraged patient to keep this appointment. Pt has a lot of stress and anxiety in her life related to her children.  Diabetes  She presents for her follow-up diabetic visit. She has type 2 diabetes mellitus. Her disease course has been stable. Hypoglycemia symptoms include nervousness/anxiousness. Pertinent negatives for hypoglycemia include no headaches. Associated symptoms include blurred vision. Pertinent negatives for diabetes include no foot paresthesias, no foot ulcerations and no visual change. There are no hypoglycemic complications. Pertinent negatives for hypoglycemia complications include no blackouts. Symptoms are stable. Diabetic complications include peripheral neuropathy. Pertinent negatives for diabetic complications include no CVA, heart disease or nephropathy. Risk factors for coronary artery disease include diabetes mellitus, obesity and post-menopausal. Current diabetic treatment includes oral agent (dual therapy). She is compliant with treatment all of the time. She is following a generally healthy diet. Her breakfast blood glucose range is generally 110-130 mg/dl. (Pt not taking blood sugars regularly ) An ACE inhibitor/angiotensin II receptor blocker is being taken. Eye exam is current.  Anxiety  Presents for follow-up visit. Symptoms include depressed mood, excessive worry, insomnia, irritability, nervous/anxious behavior, palpitations, panic and restlessness. Patient reports no shortness of breath. Symptoms occur constantly. The severity of symptoms is moderate. The quality of sleep is fair.    Hyperlipidemia  This is a chronic problem. The current episode started more than 1 year ago. The problem is controlled. Recent lipid tests were reviewed  and are normal. Exacerbating diseases include diabetes. She has no history of obesity. Pertinent negatives include no shortness of breath. Current antihyperlipidemic treatment includes statins. The current treatment provides moderate improvement of lipids. Risk factors for coronary artery disease include diabetes mellitus, dyslipidemia, family history, obesity and post-menopausal.  Gastroesophageal Reflux  She reports no belching, no choking, no coughing, no heartburn, no sore throat or no tooth decay. This is a chronic problem. The current episode started more than 1 year ago. The problem occurs rarely. The symptoms are aggravated by certain foods and lying down. Pertinent negatives include no muscle weakness. She has tried a PPI for the symptoms. The treatment provided significant relief.  Depression         This is a chronic problem.  The current episode started more than 1 year ago.   The onset quality is gradual.   The problem occurs constantly.  The problem has been waxing and waning since onset.  Associated symptoms include insomnia, restlessness and sad.  Associated symptoms include no helplessness, no hopelessness and no headaches.     The symptoms are aggravated by family issues.  Past treatments include SSRIs - Selective serotonin reuptake inhibitors and TCAs - Tricyclic antidepressants.  Compliance with treatment is good.  Past medical history includes anxiety.   Insomnia  Primary symptoms: fragmented sleep, difficulty falling asleep.  The current episode started more than one year. The onset quality is gradual. The problem has been waxing and waning since onset. PMH includes: depression.  Constipation  This is a chronic problem. The current episode started more than 1 year ago. The problem has been waxing and waning since onset. Her stool frequency is 2 to 3 times per week. The patient is on a high fiber diet. She exercises regularly. Risk factors include obesity and recent illness.  She has  tried laxatives and diet changes for the symptoms. The treatment provided mild relief.  COPD  Pt states she uses her Advair BID and states her breathing is good. Pt states she has been staying in the house which helps.  Mixed Incontinence  PT states she is currently taking [name] 50 mg daily. Pt is currently going to Urologists who is manages this. Pt states she had her TEN's unit placed that is helping.    Review of Systems  Constitutional: Positive for irritability.  HENT: Negative.  Negative for sore throat.   Eyes: Positive for blurred vision.  Respiratory: Negative.  Negative for cough, choking and shortness of breath.   Cardiovascular: Positive for palpitations.  Gastrointestinal: Positive for constipation. Negative for heartburn.  Endocrine: Negative.   Genitourinary: Negative.   Musculoskeletal: Negative.  Negative for muscle weakness.  Neurological: Negative for headaches.  Hematological: Negative.   Psychiatric/Behavioral: Positive for depression. The patient is nervous/anxious and has insomnia.   All other systems reviewed and are negative.      Objective:   Physical Exam  Constitutional: She is oriented to person, place, and time. She appears well-developed and well-nourished. No distress.  HENT:  Head: Normocephalic and atraumatic.  Right Ear: External ear normal.  Left Ear: External ear normal.  Nose: Nose normal.  Mouth/Throat: Oropharynx is clear and moist.  Eyes: Pupils are equal, round, and reactive to light.  Neck: Normal range of motion. Neck supple. No thyromegaly present.  Cardiovascular: Normal rate, regular rhythm, normal heart sounds and intact distal pulses.   No murmur heard. Pulmonary/Chest: Effort normal and breath sounds normal. No respiratory distress. She has no wheezes.  Abdominal: Soft. Bowel sounds are normal. She exhibits no distension. There is no tenderness.  Musculoskeletal: Normal range of motion. She exhibits no edema or tenderness.    Neurological: She is alert and oriented to person, place, and time. She has normal reflexes. No cranial nerve deficit.  Skin: Skin is warm and dry.  Psychiatric: Judgment and thought content normal. Her mood appears anxious. She is hyperactive.  Vitals reviewed.   BP 91/60   Pulse 96   Temp 97.7 F (36.5 C) (Oral)   Ht '5\' 7"'  (1.702 m)   Wt 178 lb (80.7 kg)   BMI 27.88 kg/m        Assessment & Plan:  1. Chronic obstructive pulmonary disease, unspecified COPD type (HCC) - CMP14+EGFR - Fluticasone-Salmeterol (ADVAIR DISKUS) 250-50 MCG/DOSE AEPB; Inhale 1 puff into the lungs 2 (two) times daily.  Dispense: 90 each; Refill: 3 - tiotropium (SPIRIVA HANDIHALER) 18 MCG inhalation capsule; Place 1 capsule (18 mcg total) into inhaler and inhale daily.  Dispense: 90 capsule; Refill: 1  2. Gastroesophageal reflux disease, esophagitis presence not specified - CMP14+EGFR - omeprazole (PRILOSEC) 40 MG capsule; TAKE 1 CAPSULE BY MOUTH EVERY DAY---  takes in pm  Dispense: 90 capsule; Refill: 1  3. Constipation, unspecified constipation type - CMP14+EGFR  4. Type 2 diabetes mellitus without complication, without long-term current use of insulin (HCC) - CMP14+EGFR - Bayer DCA Hb A1c Waived - metFORMIN (GLUCOPHAGE) 1000 MG tablet; Take 1 tablet (1,000 mg total) by mouth 2 (two) times daily.  Dispense: 180 tablet; Refill: 0  5. Depression, unspecified depression type - CMP14+EGFR - buPROPion (WELLBUTRIN XL) 300 MG 24 hr tablet; Take 1 tablet (300 mg total) by mouth every morning.  Dispense: 90 tablet; Refill: 1 - DULoxetine (CYMBALTA) 60 MG capsule; Take 1 capsule (60 mg total) by mouth  daily.  Dispense: 90 capsule; Refill: 1  6. GAD (generalized anxiety disorder) - CMP14+EGFR - buPROPion (WELLBUTRIN XL) 300 MG 24 hr tablet; Take 1 tablet (300 mg total) by mouth every morning.  Dispense: 90 tablet; Refill: 1 - DULoxetine (CYMBALTA) 60 MG capsule; Take 1 capsule (60 mg total) by mouth daily.   Dispense: 90 capsule; Refill: 1  7. Mixed hyperlipidemia - CMP14+EGFR - Lipid panel  8. Insomnia, unspecified type - CMP14+EGFR - traZODone (DESYREL) 150 MG tablet; Take 1 tablet (150 mg total) by mouth at bedtime.  Dispense: 90 tablet; Refill: 1  9. Mixed incontinence urge and stress - CMP14+EGFR - mirabegron ER ([name]) 50 MG TB24 tablet; Take 1 tablet (50 mg total) by mouth daily.  Dispense: 90 tablet; Refill: 1  10. Vitamin D deficiency - CMP14+EGFR  11. Chronic allergic rhinitis due to pollen, unspecified seasonality - fluticasone (FLONASE) 50 MCG/ACT nasal spray; INSTILL 2 SPRAYS INTO EACH NOSTRIL EVERY DAY  Dispense: 48 g; Refill: 5 - loratadine (CLARITIN) 10 MG tablet; Take 1 tablet (10 mg total) by mouth daily.  Dispense: 90 tablet; Refill: 1    Continue all meds, and keep appt with Psychologists tomorrow!!!! Labs pending Health Maintenance reviewed Diet and exercise encouraged RTO 3 months  Evelina Dun, FNP

## 2016-10-06 ENCOUNTER — Ambulatory Visit (HOSPITAL_COMMUNITY): Payer: Self-pay | Admitting: Psychiatry

## 2016-10-06 LAB — CMP14+EGFR
ALT: 8 IU/L (ref 0–32)
AST: 11 IU/L (ref 0–40)
Albumin/Globulin Ratio: 2.1 (ref 1.2–2.2)
Albumin: 4.4 g/dL (ref 3.6–4.8)
Alkaline Phosphatase: 73 IU/L (ref 39–117)
BUN/Creatinine Ratio: 15 (ref 12–28)
BUN: 12 mg/dL (ref 8–27)
Bilirubin Total: <0.2 mg/dL (ref 0.0–1.2)
CALCIUM: 9.4 mg/dL (ref 8.7–10.3)
CO2: 26 mmol/L (ref 18–29)
CREATININE: 0.81 mg/dL (ref 0.57–1.00)
Chloride: 98 mmol/L (ref 96–106)
GFR, EST AFRICAN AMERICAN: 91 mL/min/{1.73_m2} (ref >59)
GFR, EST NON AFRICAN AMERICAN: 79 mL/min/{1.73_m2} (ref >59)
GLOBULIN, TOTAL: 2.1 g/dL (ref 1.5–4.5)
Glucose: 95 mg/dL (ref 65–99)
Potassium: 4.6 mmol/L (ref 3.5–5.2)
Sodium: 140 mmol/L (ref 134–144)
TOTAL PROTEIN: 6.5 g/dL (ref 6.0–8.5)

## 2016-10-06 LAB — LIPID PANEL
CHOL/HDL RATIO: 3.3 ratio (ref 0.0–4.4)
Cholesterol, Total: 216 mg/dL — ABNORMAL HIGH (ref 100–199)
HDL: 65 mg/dL (ref >39)
LDL CALC: 132 mg/dL — AB (ref 0–99)
TRIGLYCERIDES: 94 mg/dL (ref 0–149)
VLDL Cholesterol Cal: 19 mg/dL (ref 5–40)

## 2016-10-07 ENCOUNTER — Encounter (HOSPITAL_COMMUNITY): Payer: Self-pay | Admitting: Psychiatry

## 2016-10-07 ENCOUNTER — Ambulatory Visit (INDEPENDENT_AMBULATORY_CARE_PROVIDER_SITE_OTHER): Payer: Medicaid Other | Admitting: Psychiatry

## 2016-10-07 DIAGNOSIS — F411 Generalized anxiety disorder: Secondary | ICD-10-CM | POA: Diagnosis not present

## 2016-10-07 DIAGNOSIS — F329 Major depressive disorder, single episode, unspecified: Secondary | ICD-10-CM | POA: Diagnosis not present

## 2016-10-07 DIAGNOSIS — F32A Depression, unspecified: Secondary | ICD-10-CM
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
## 2016-10-13 ENCOUNTER — Other Ambulatory Visit: Payer: Self-pay | Admitting: Family

## 2016-10-26 LAB — HM DIABETES EYE EXAM

## 2016-11-11 ENCOUNTER — Other Ambulatory Visit: Payer: Self-pay | Admitting: Family

## 2016-11-24 ENCOUNTER — Encounter: Payer: Self-pay | Admitting: Gastroenterology

## 2016-11-24 ENCOUNTER — Ambulatory Visit (INDEPENDENT_AMBULATORY_CARE_PROVIDER_SITE_OTHER): Payer: Medicaid Other | Admitting: Gastroenterology

## 2016-11-24 VITALS — BP 114/72 | HR 84 | Temp 98.0°F | Ht 67.0 in | Wt 178.2 lb

## 2016-11-24 DIAGNOSIS — K5900 Constipation, unspecified: Secondary | ICD-10-CM | POA: Diagnosis not present

## 2016-11-24 DIAGNOSIS — R1310 Dysphagia, unspecified: Secondary | ICD-10-CM

## 2016-11-24 MED ORDER — LUBIPROSTONE 8 MCG PO CAPS
8.0000 ug | ORAL_CAPSULE | Freq: Two times a day (BID) | ORAL | 3 refills | Status: DC
Start: 2016-11-24 — End: 2017-12-28

## 2016-11-24 NOTE — Progress Notes (Signed)
Primary Care Physician:  Evelina Dun, FNP  Primary Gastroenterologist:  Garfield Cornea, MD   Chief Complaint  Patient presents with  . Constipation    4-5 days between BM  . Dysphagia    hx salivary gland cancer  . Gas    HPI:  [name] is a 60 y.o. female here For follow-up. She was last seen in May of this year. She underwent an EGD and colonoscopy for dysphagia and high risk colon cancer surveillance/personal history of non-advanced adenoma. She had mild bearing due to Schatzki ring at the GE junction, small hiatal hernia, esophagus was dilated with 56 [place]. Colon was normal. Plans for repeat colonoscopy in 5 years.  Goes back to see Dr. Isidore Moos soon. Feels a knot in the left neck region. Believes at the site of her previous salivary gland tumor. Recent esophageal dilation did not improve her symptoms. Continues to have difficulty swallowing, at least in part due to decreased saliva but also feels like food is sticking in the throat area. Finds it very difficult to eat. Mostly consuming liquid diet. Reported intentional related to prior radiation therapy. Feels like constipation is a result of her dietary changes but calls of her difficulty eating. She did not tolerate Linzess. 110mcg was not effective and 229mcg tore her stomach up. No melena, brbpr.        Current Outpatient Prescriptions  Medication Sig Dispense Refill  . ACCU-CHEK AVIVA PLUS test strip USE TO TEST BLOOD GLUCOSE WITH TWO TO THREE TIMES A DAY AS DIRECTED 100 each 5  . ACCU-CHEK SOFTCLIX LANCETS lancets USE TO TEST BLOOD GLUCOSE THREE TIMES DAILY AS DIRECTED 100 each 5  . ALPRAZolam (XANAX) 1 MG tablet Take 1 tablet (1 mg total) by mouth 2 (two) times daily. 60 tablet 3  . buPROPion (WELLBUTRIN XL) 300 MG 24 hr tablet Take 1 tablet (300 mg total) by mouth every morning. 90 tablet 1  . DULoxetine (CYMBALTA) 60 MG capsule Take 1 capsule (60 mg total) by mouth daily. 90 capsule 1  . fluticasone (FLONASE) 50  MCG/ACT nasal spray INSTILL 2 SPRAYS INTO EACH NOSTRIL EVERY DAY 48 g 5  . Fluticasone-Salmeterol (ADVAIR DISKUS) 250-50 MCG/DOSE AEPB Inhale 1 puff into the lungs 2 (two) times daily. 90 each 3  . ibuprofen (ADVIL,MOTRIN) 600 MG tablet Take 1 tablet (600 mg total) by mouth every 8 (eight) hours as needed. Patient takes only as needed - less than daily 30 tablet 3  . loratadine (CLARITIN) 10 MG tablet Take 1 tablet (10 mg total) by mouth daily. 90 tablet 1  . magic mouthwash SOLN Take 5 mLs by mouth 4 (four) times daily as needed for mouth pain. 500 mL 6  . meloxicam (MOBIC) 15 MG tablet TAKE 1 TABLET BY MOUTH EVERY DAY 90 tablet 0  . metFORMIN (GLUCOPHAGE) 1000 MG tablet Take 1 tablet (1,000 mg total) by mouth 2 (two) times daily. 180 tablet 0  . mirabegron ER ([name]) 50 MG TB24 tablet Take 1 tablet (50 mg total) by mouth daily. 90 tablet 1  . omeprazole (PRILOSEC) 40 MG capsule TAKE 1 CAPSULE BY MOUTH EVERY DAY---  takes in pm 90 capsule 1  . PROAIR HFA 108 (90 Base) MCG/ACT inhaler INHALE 2 PUFFS EVERY 4 TO 6 HOURS AS NEEDED 25.5 g 0  . sodium fluoride (FLUORISHIELD) 1.1 % GEL dental gel Instill one drop of gel per tooth space of fluoride tray. Place over teeth for 5 minutes. Remove. Spit out excess. Repeat  nightly. 120 mL 11  . tiotropium (SPIRIVA HANDIHALER) 18 MCG inhalation capsule Place 1 capsule (18 mcg total) into inhaler and inhale daily. 90 capsule 1  . traZODone (DESYREL) 150 MG tablet Take 1 tablet (150 mg total) by mouth at bedtime. 90 tablet 1  . Vitamin D, Ergocalciferol, (DRISDOL) 50000 units CAPS capsule TAKE 1 CAPSULE BY MOUTH ONCE A WEEK 12 capsule 3   No current facility-administered medications for this visit.     Allergies as of 11/24/2016 - Review Complete 11/24/2016  Allergen Reaction Noted  . Penicillins Anaphylaxis and Swelling 03/08/2012  . Bee venom Hives and Swelling 03/08/2012    Past Medical History:  Diagnosis Date  . Adenoid cystic carcinoma of  submandibular gland [facility]) dx 2016---  oncologist-  dr Eppie Gibson [facility])   Left submandibular salivary gland--  T1 N0 M0,  Grade 2 w/ perineural invasion  s/p  resection 03-05-2015  and Radiation therapy 04-29-2015 to 06-11-2015  . Allergic rhinitis   . Anemia   . Arthritis   . Bipolar disorder ([place])    [place]  . COPD (chronic obstructive pulmonary disease) ([place])   . Cystocele   . Decreased sense of taste    secondary to radiation  . GAD (generalized anxiety disorder)   . GERD (gastroesophageal reflux disease)   . Headache   . Hemorrhoid    INTERNAL AND EXTERNAL    . History of adenomatous polyp of colon   . History of breast cancer dx 2007--- oncologist-- dr Lollie Marrow--- no recurrence   Right breast DCIS ,  Stage 2 (T2 N0 M0) --  s/p  right partial mastecotmy w/ sln dissection,  chemotherapy complete 02-01-2007,  radiation completed 06-15-2007  . History of esophageal dilatation   . History of esophagitis   . History of external beam radiation therapy    04-27-2007 to 06-15-2007 , right breast- 5040cGy in 28 sessions and boost 1200 cGy in 6 sessions/   04-29-2015 to 06-11-2015 , left neck and skull base -- 60 Gy in 30 fractions  . History of hiatal hernia   . History of panic attacks   . History of perforation of tympanic membrane    AFTER ACUTE OTITIS MEDIA 2016-- RESOLVED  . Hypertriglyceridemia    ?  [place] Kitchen Loss of teeth due to extraction    secondary to radiation therapy---  x8 or 9  . Mouth dryness    secondary to radiation  . Neck pain    resuidaul neck pain from radiation  . Peripheral neuropathy ([place])   . TMJ (temporomandibular joint disorder)   . Type 2 diabetes mellitus ([place])   . Urge and stress incontinence   . Wears glasses     Past Surgical History:  Procedure Laterality Date  . COLONOSCOPY  03/30/2012   RMR: rectal and colonic polyps -removed as described above. Mellanosis coli  . COLONOSCOPY WITH PROPOFOL N/A 05/25/2016   Procedure: COLONOSCOPY  WITH PROPOFOL;  Surgeon: Daneil Dolin, MD;  [facility];  Service: Endoscopy;  Laterality: N/A;  1400   . ECTOPIC PREGNANCY SURGERY    . ESOPHAGOGASTRODUODENOSCOPY (EGD) WITH PROPOFOL N/A 05/25/2016   Rourk: small hh, status post dilation of Schatzki ring, normal colon. Next colonoscopy June 2022  . INTERSTIM IMPLANT PLACEMENT N/A 08/11/2016   Serial Number [id number] H.Procedure: INTERSTIM IMPLANT FIRST STAGE;  Surgeon: Bjorn Loser, MD;  Location: [facility];  Service: Urology;  Laterality: N/A;  . INTERSTIM IMPLANT PLACEMENT N/A 08/11/2016  Procedure: INTERSTIM IMPLANT SECOND STAGE IMPEDANCE CHECK;  Surgeon: Bjorn Loser, MD;  Location: [facility];  Service: Urology;  Laterality: N/A;  . Venia Minks DILATION N/A 05/25/2016   Procedure: Venia Minks DILATION;  Surgeon: Daneil Dolin, MD;  Location: [facility];  Service: Endoscopy;  Laterality: N/A;  . PARTIAL MASTECTOMY WITH AXILLARY SENTINEL LYMPH NODE BIOPSY Right 12/01/2006   and Right axilla node dissection x1  . PORT-A-CATH PLACEMENT AND REMOVAL  12-31-2006/  05-13-2007  . SUBMANDIBULAR GLAND EXCISION Left 03/05/2015   adenoid cystic carcinoma  . TRANSTHORACIC ECHOCARDIOGRAM  12/29/2006   normal echo, ef 55%  . TUBAL LIGATION  yrs ago  . VAGINAL HYSTERECTOMY  10-26-2007  dr Elonda Husky   w/ Anterior Repair with intraspinous graft and vagainal vault suspension    Family History  Problem Relation Age of Onset  . Diabetes Mother   . Hypertension Mother   . Cancer Mother     unknown primary  . Bipolar disorder Mother   . Colon cancer Paternal Grandmother 61    deceased  . Diabetes Daughter 7    type 1  . Depression Sister   . Bipolar disorder Paternal Grandfather   . Depression Sister   . Liver disease Neg Hx     Social History   Social History  . Marital status: Married    Spouse name: N/A  . Number of children: 3  . Years of education: N/A   Occupational History  . Not on file.    Social History Main Topics  . Smoking status: Former Smoker    Packs/day: 1.50    Years: 25.00    Types: Cigarettes    Quit date: 01/09/1996  . Smokeless tobacco: Never Used  . Alcohol use No  . Drug use: No  . Sexual activity: No   Other Topics Concern  . Not on file   Social History Narrative  . No narrative on file      ROS:  General: Negative for  weight loss, fever, chills, fatigue, weakness. Eyes: Negative for vision changes.  ENT: Negative for hoarseness,  nasal congestion. See hpi CV: Negative for chest pain, angina, palpitations, dyspnea on exertion, peripheral edema.  Respiratory: Negative for dyspnea at rest, dyspnea on exertion, cough, sputum, wheezing.  GI: See history of present illness. GU:  Negative for dysuria, hematuria, urinary incontinence, urinary frequency, nocturnal urination.  MS: Negative for joint pain, low back pain.  Derm: Negative for rash or itching.  Neuro: Negative for weakness, abnormal sensation, seizure, frequent headaches, memory loss, confusion.  Psych: Negative for anxiety, depression, suicidal ideation, hallucinations.  Endo: Negative for unusual weight change.  Heme: Negative for bruising or bleeding. Allergy: Negative for rash or hives.    Physical Examination:  BP 114/72   Pulse 84   Temp 98 F (36.7 C) (Oral)   Ht 5\' 7"  (1.702 m)   Wt 178 lb 3.2 oz (80.8 kg)   BMI 27.91 kg/m    General: Well-nourished, well-developed in no acute distress.  Head: Normocephalic, atraumatic.    Lungs: Clear to auscultation bilaterally.  Heart: Regular rate and rhythm, no murmurs rubs or gallops.  Abdomen: Bowel sounds are normal, nontender, nondistended, no hepatosplenomegaly or masses, no abdominal bruits or    hernia , no rebound or guarding.   Rectal: not performed Extremities: No lower extremity edema. No clubbing or deformities.  Neuro: Alert and oriented x 4 , grossly normal neurologically.  Skin: Warm and dry, no rash or  jaundice.   Psych: Alert and cooperative, normal mood and affect.  Labs: Lab Results  Component Value Date   CREATININE 0.81 10/05/2016   BUN 12 10/05/2016   NA 140 10/05/2016   K 4.6 10/05/2016   CL 98 10/05/2016   CO2 26 10/05/2016   Lab Results  Component Value Date   ALT 8 10/05/2016   AST 11 10/05/2016   ALKPHOS 73 10/05/2016   BILITOT <0.2 10/05/2016   Lab Results  Component Value Date   WBC 2.9 (L) 05/22/2016   HGB 13.6 08/11/2016   HCT 40.0 08/11/2016   MCV 90.4 05/22/2016   PLT 250 05/22/2016     Imaging Studies: No results found.

## 2016-11-24 NOTE — Assessment & Plan Note (Signed)
Suspect multifactorial given radiation therapy to the neck region, limited saliva, cannot exclude esophageal motility disorder and/or persistent Schatzki ring. Recommend barium pill esophagram in the near future for further evaluation.

## 2016-11-24 NOTE — Assessment & Plan Note (Signed)
Patient having difficulty incorporating fiber in her diet due to difficulty swallowing. Trial of an Amitiza 8 g twice a day. She will call if we need to dose adjust. Return to the office in 6 months.

## 2016-11-24 NOTE — Patient Instructions (Signed)
1. Start Amitiza 80mcg twice daily with food for constipation. We can adjust dose if needed so please call with any concerns.  2. Xray of your esophagus for evaluate swallowing concerns. 3. I will determine when your next colonoscopy is due and let you know.  4. Return to the office in six months or sooner if needed.

## 2016-11-25 NOTE — Progress Notes (Signed)
cc'ed to pcp °

## 2016-11-27 ENCOUNTER — Ambulatory Visit (HOSPITAL_COMMUNITY)
Admission: RE | Admit: 2016-11-27 | Discharge: 2016-11-27 | Disposition: A | Discharge status: Home / Self Care | Payer: Medicaid Other | Source: Ambulatory Visit | Attending: Gastroenterology | Admitting: Gastroenterology

## 2016-11-27 DIAGNOSIS — R1310 Dysphagia, unspecified: Secondary | ICD-10-CM

## 2016-11-27 MED ORDER — SODIUM BICARBONATE 4 % IV SOLN
INTRAVENOUS | Status: AC
  Filled 2016-11-27: qty 5

## 2016-12-05 ENCOUNTER — Other Ambulatory Visit: Payer: Self-pay | Admitting: Family

## 2016-12-10 NOTE — Progress Notes (Signed)
Please let patient know her esophagus xray was normal. Nothing in the esophagus to explain her symptoms. I would like for her to follow up with her radiation oncologist, oncologist, or surgeon regarding her concerns of a knot in the left neck where her cancer was.   Also let patient know that I sent her an email regarding timing of next TCS, will be due 2022.

## 2016-12-11 ENCOUNTER — Other Ambulatory Visit: Payer: Self-pay | Admitting: Family

## 2017-01-05 ENCOUNTER — Encounter: Payer: Self-pay | Admitting: Family

## 2017-01-05 ENCOUNTER — Ambulatory Visit (INDEPENDENT_AMBULATORY_CARE_PROVIDER_SITE_OTHER): Payer: Medicaid Other | Admitting: Family

## 2017-01-05 VITALS — BP 137/76 | HR 75 | Temp 98.9°F | Ht 67.0 in | Wt 183.0 lb

## 2017-01-05 DIAGNOSIS — E782 Mixed hyperlipidemia: Secondary | ICD-10-CM

## 2017-01-05 DIAGNOSIS — K219 Gastro-esophageal reflux disease without esophagitis: Secondary | ICD-10-CM

## 2017-01-05 DIAGNOSIS — F411 Generalized anxiety disorder: Secondary | ICD-10-CM | POA: Diagnosis not present

## 2017-01-05 DIAGNOSIS — F329 Major depressive disorder, single episode, unspecified: Secondary | ICD-10-CM

## 2017-01-05 DIAGNOSIS — K5900 Constipation, unspecified: Secondary | ICD-10-CM

## 2017-01-05 DIAGNOSIS — E119 Type 2 diabetes mellitus without complications: Secondary | ICD-10-CM | POA: Diagnosis not present

## 2017-01-05 DIAGNOSIS — N3946 Mixed incontinence: Secondary | ICD-10-CM

## 2017-01-05 DIAGNOSIS — G4700 Insomnia, unspecified: Secondary | ICD-10-CM

## 2017-01-05 DIAGNOSIS — F32A Depression, unspecified: Secondary | ICD-10-CM

## 2017-01-05 DIAGNOSIS — J449 Chronic obstructive pulmonary disease, unspecified: Secondary | ICD-10-CM

## 2017-01-05 DIAGNOSIS — E559 Vitamin D deficiency, unspecified: Secondary | ICD-10-CM

## 2017-01-05 LAB — BAYER DCA HB A1C WAIVED: HB A1C (BAYER DCA - WAIVED): 5.7 % (ref <7.0)

## 2017-01-05 MED ORDER — ALPRAZOLAM 1 MG PO TABS: 1.0000 mg | ORAL_TABLET | Freq: Two times a day (BID) | ORAL | 3 refills | Status: DC

## 2017-01-05 MED ORDER — FLUOXETINE HCL 40 MG PO CAPS: 40.0000 mg | ORAL_CAPSULE | Freq: Every day | ORAL | 3 refills | Status: DC

## 2017-01-05 NOTE — Patient Instructions (Signed)
Generalized Anxiety Disorder Generalized anxiety disorder (GAD) is a mental disorder. It interferes with life functions, including relationships, work, and school. GAD is different from normal anxiety, which everyone experiences at some point in their lives in response to specific life events and activities. Normal anxiety actually helps us prepare for and get through these life events and activities. Normal anxiety goes away after the event or activity is over.  GAD causes anxiety that is not necessarily related to specific events or activities. It also causes excess anxiety in proportion to specific events or activities. The anxiety associated with GAD is also difficult to control. GAD can vary from mild to severe. People with severe GAD can have intense waves of anxiety with physical symptoms (panic attacks).  SYMPTOMS The anxiety and worry associated with GAD are difficult to control. This anxiety and worry are related to many life events and activities and also occur more days than not for 6 months or longer. People with GAD also have three or more of the following symptoms (one or more in children):  Restlessness.   Fatigue.  Difficulty concentrating.   Irritability.  Muscle tension.  Difficulty sleeping or unsatisfying sleep. DIAGNOSIS GAD is diagnosed through an assessment by your health care provider. Your health care provider will ask you questions aboutyour mood,physical symptoms, and events in your life. Your health care provider may ask you about your medical history and use of alcohol or drugs, including prescription medicines. Your health care provider may also do a physical exam and blood tests. Certain medical conditions and the use of certain substances can cause symptoms similar to those associated with GAD. Your health care provider may refer you to a mental health specialist for further evaluation. TREATMENT The following therapies are usually used to treat GAD:    Medication. Antidepressant medication usually is prescribed for long-term daily control. Antianxiety medicines may be added in severe cases, especially when panic attacks occur.   Talk therapy (psychotherapy). Certain types of talk therapy can be helpful in treating GAD by providing support, education, and guidance. A form of talk therapy called cognitive behavioral therapy can teach you healthy ways to think about and react to daily life events and activities.  Stress managementtechniques. These include yoga, meditation, and exercise and can be very helpful when they are practiced regularly. A mental health specialist can help determine which treatment is best for you. Some people see improvement with one therapy. However, other people require a combination of therapies. This information is not intended to replace advice given to you by your health care provider. Make sure you discuss any questions you have with your health care provider. Document Released: 04/03/2013 Document Revised: 12/28/2014 Document Reviewed: 04/03/2013 Elsevier Interactive Patient Education  2017 Elsevier Inc.  

## 2017-01-05 NOTE — Progress Notes (Signed)
Subjective:    Patient ID: [name], female    DOB: 02/24/1956, 61 y.o.   MRN: [id number]  Pt presents to the office today for chronic follow up. Pt history of Left submandibular cancer.   Diabetes  She presents for her follow-up diabetic visit. She has type 2 diabetes mellitus. Her disease course has been stable. Hypoglycemia symptoms include nervousness/anxiousness. Pertinent negatives for hypoglycemia include no headaches. Associated symptoms include blurred vision. Pertinent negatives for diabetes include no foot paresthesias, no foot ulcerations and no visual change. There are no hypoglycemic complications. Pertinent negatives for hypoglycemia complications include no blackouts. Symptoms are stable. Diabetic complications include peripheral neuropathy. Pertinent negatives for diabetic complications include no CVA, heart disease or nephropathy. Risk factors for coronary artery disease include diabetes mellitus, obesity and post-menopausal. Current diabetic treatment includes oral agent (dual therapy). She is compliant with treatment all of the time. She is following a generally healthy diet. Her breakfast blood glucose range is generally 110-130 mg/dl. (Pt not taking blood sugars regularly ) An ACE inhibitor/angiotensin II receptor blocker is being taken. Eye exam is current.  Anxiety  Presents for follow-up visit. Symptoms include depressed mood, excessive worry, insomnia, irritability, nervous/anxious behavior, palpitations, panic and restlessness. Patient reports no shortness of breath. Symptoms occur most days. The severity of symptoms is moderate. The quality of sleep is fair.    Hyperlipidemia  This is a chronic problem. The current episode started more than 1 year ago. The problem is uncontrolled. Recent lipid tests were reviewed and are high. Exacerbating diseases include diabetes and obesity. Pertinent negatives include no shortness of breath. Current antihyperlipidemic treatment  includes statins. The current treatment provides moderate improvement of lipids. Risk factors for coronary artery disease include diabetes mellitus, dyslipidemia, family history, obesity and post-menopausal.  Gastroesophageal Reflux  She reports no belching, no choking, no coughing, no heartburn, no sore throat or no tooth decay. This is a chronic problem. The current episode started more than 1 year ago. The problem occurs rarely. The symptoms are aggravated by certain foods and lying down. Pertinent negatives include no muscle weakness. She has tried a PPI for the symptoms. The treatment provided significant relief.  Depression         This is a chronic problem.  The current episode started more than 1 year ago.   The onset quality is gradual.   The problem occurs constantly.  The problem has been waxing and waning since onset.  Associated symptoms include insomnia, restlessness and sad.  Associated symptoms include no helplessness, no hopelessness and no headaches.     The symptoms are aggravated by family issues.  Past treatments include SSRIs - Selective serotonin reuptake inhibitors and TCAs - Tricyclic antidepressants.  Compliance with treatment is good.  Past medical history includes anxiety.   Insomnia  Primary symptoms: fragmented sleep, difficulty falling asleep.  The current episode started more than one year. The onset quality is gradual. The problem has been waxing and waning since onset. PMH includes: depression.  Constipation  This is a chronic problem. The current episode started more than 1 year ago. The problem has been waxing and waning since onset. Her stool frequency is 2 to 3 times per week. The patient is on a high fiber diet. She exercises regularly. Risk factors include obesity and recent illness. She has tried laxatives and diet changes for the symptoms. The treatment provided mild relief.  COPD  Pt states she uses her Advair BID and Spiriva daily states  her breathing is good.    Mixed Incontinence  PT states she is currently taking [name] 50 mg daily. Pt is currently going to Urologists who is manages this. Pt states she had her TEN's unit placed that is helping.    Review of Systems  Constitutional: Positive for irritability.  HENT: Negative.  Negative for sore throat.   Eyes: Positive for blurred vision.  Respiratory: Negative.  Negative for cough, choking and shortness of breath.   Cardiovascular: Positive for palpitations.  Gastrointestinal: Positive for constipation. Negative for heartburn.  Endocrine: Negative.   Genitourinary: Negative.   Musculoskeletal: Negative.  Negative for muscle weakness.  Neurological: Negative for headaches.  Hematological: Negative.   Psychiatric/Behavioral: Positive for depression. The patient is nervous/anxious and has insomnia.   All other systems reviewed and are negative.      Objective:   Physical Exam  Constitutional: She is oriented to person, place, and time. She appears well-developed and well-nourished. No distress.  HENT:  Head: Normocephalic and atraumatic.  Right Ear: External ear normal.  Left Ear: External ear normal.  Nose: Nose normal.  Mouth/Throat: Oropharynx is clear and moist.  Eyes: Pupils are equal, round, and reactive to light.  Neck: Normal range of motion. Neck supple. No thyromegaly present.  Cardiovascular: Normal rate, regular rhythm, normal heart sounds and intact distal pulses.   No murmur heard. Pulmonary/Chest: Effort normal and breath sounds normal. No respiratory distress. She has no wheezes.  Abdominal: Soft. Bowel sounds are normal. She exhibits no distension. There is no tenderness.  Musculoskeletal: Normal range of motion. She exhibits no edema or tenderness.  Neurological: She is alert and oriented to person, place, and time. She has normal reflexes. No cranial nerve deficit.  Skin: Skin is warm and dry.  Psychiatric: Judgment and thought content normal. Her mood appears  anxious. She is hyperactive.  Vitals reviewed.   BP 137/76   Pulse 75   Temp 98.9 F (37.2 C) (Oral)   Ht '5\' 7"'  (1.702 m)   Wt 183 lb (83 kg)   BMI 28.66 kg/m        Assessment & Plan:  1. Chronic obstructive pulmonary disease, unspecified COPD type ([place]) - CMP14+EGFR  2. Gastroesophageal reflux disease, esophagitis presence not specified - CMP14+EGFR  3. Constipation, unspecified constipation type - CMP14+EGFR  4. Type 2 diabetes mellitus without complication, without long-term current use of insulin ([facility]) - Bayer DCA Hb A1c Waived - CMP14+EGFR  5. Vitamin D deficiency - CMP14+EGFR - VITAMIN D 25 Hydroxy (Vit-D Deficiency, Fractures)  6. Mixed incontinence urge and stress - CMP14+EGFR  7. Insomnia, unspecified type - CMP14+EGFR  8. Mixed hyperlipidemia - CMP14+EGFR - Lipid panel  9. GAD (generalized anxiety disorder) -Pt requesting to try Prozac again. Will stop Cymbalta today and start Prozac. Pt is emotionally the best I have seen her in the last few years. Pt would still like to try. - CMP14+EGFR - ALPRAZolam (XANAX) 1 MG tablet; Take 1 tablet (1 mg total) by mouth 2 (two) times daily.  Dispense: 60 tablet; Refill: 3 - FLUoxetine (PROZAC) 40 MG capsule; Take 1 capsule (40 mg total) by mouth daily.  Dispense: 90 capsule; Refill: 3  10. Depression, unspecified depression type - CMP14+EGFR - FLUoxetine (PROZAC) 40 MG capsule; Take 1 capsule (40 mg total) by mouth daily.  Dispense: 90 capsule; Refill: 3   Continue all meds Labs pending Health Maintenance reviewed Diet and exercise encouraged RTO 4 months  Evelina Dun, FNP

## 2017-01-06 ENCOUNTER — Other Ambulatory Visit: Payer: Self-pay | Admitting: Family

## 2017-01-06 LAB — LIPID PANEL
CHOLESTEROL TOTAL: 212 mg/dL — AB (ref 100–199)
Chol/HDL Ratio: 3.2 ratio units (ref 0.0–4.4)
HDL: 67 mg/dL (ref >39)
LDL Calculated: 123 mg/dL — ABNORMAL HIGH (ref 0–99)
TRIGLYCERIDES: 108 mg/dL (ref 0–149)
VLDL CHOLESTEROL CAL: 22 mg/dL (ref 5–40)

## 2017-01-06 LAB — CMP14+EGFR
A/G RATIO: 3 — AB (ref 1.2–2.2)
ALT: 13 IU/L (ref 0–32)
AST: 12 IU/L (ref 0–40)
Albumin: 4.5 g/dL (ref 3.6–4.8)
Alkaline Phosphatase: 61 IU/L (ref 39–117)
BILIRUBIN TOTAL: 0.3 mg/dL (ref 0.0–1.2)
BUN/Creatinine Ratio: 14 (ref 12–28)
BUN: 11 mg/dL (ref 8–27)
CHLORIDE: 99 mmol/L (ref 96–106)
CO2: 27 mmol/L (ref 18–29)
Calcium: 9.3 mg/dL (ref 8.7–10.3)
Creatinine, Ser: 0.79 mg/dL (ref 0.57–1.00)
GFR calc non Af Amer: 81 mL/min/{1.73_m2} (ref >59)
GFR, EST AFRICAN AMERICAN: 93 mL/min/{1.73_m2} (ref >59)
GLUCOSE: 94 mg/dL (ref 65–99)
Globulin, Total: 1.5 g/dL (ref 1.5–4.5)
POTASSIUM: 4.8 mmol/L (ref 3.5–5.2)
Sodium: 139 mmol/L (ref 134–144)
TOTAL PROTEIN: 6 g/dL (ref 6.0–8.5)

## 2017-01-06 LAB — VITAMIN D 25 HYDROXY (VIT D DEFICIENCY, FRACTURES): VIT D 25 HYDROXY: 41.8 ng/mL (ref 30.0–100.0)

## 2017-01-06 MED ORDER — PRAVASTATIN SODIUM 40 MG PO TABS: 40.0000 mg | ORAL_TABLET | Freq: Every day | ORAL | 2 refills | Status: DC

## 2017-01-11 ENCOUNTER — Telehealth: Payer: Self-pay | Admitting: Family

## 2017-01-11 DIAGNOSIS — F32A Depression, unspecified: Secondary | ICD-10-CM

## 2017-01-11 DIAGNOSIS — F411 Generalized anxiety disorder: Secondary | ICD-10-CM

## 2017-01-11 DIAGNOSIS — F329 Major depressive disorder, single episode, unspecified: Secondary | ICD-10-CM

## 2017-01-11 MED ORDER — FLUOXETINE HCL 40 MG PO CAPS: 40.0000 mg | ORAL_CAPSULE | Freq: Every day | ORAL | 3 refills | Status: DC

## 2017-01-11 NOTE — Telephone Encounter (Signed)
prozac fixed - now at correct pharm === [name] she also wants IBU rx sent to [facility] for 90 day supply? Please address

## 2017-01-12 ENCOUNTER — Telehealth: Payer: Self-pay | Admitting: Family

## 2017-01-12 NOTE — Telephone Encounter (Signed)
Lm 1/23-jhb

## 2017-01-12 NOTE — Telephone Encounter (Signed)
Pt is currently taking mobic. She can not be on mobic and motrin rx.

## 2017-01-12 NOTE — Telephone Encounter (Signed)
Pt aware.

## 2017-01-13 ENCOUNTER — Telehealth: Payer: Self-pay | Admitting: Family

## 2017-01-13 ENCOUNTER — Telehealth: Payer: Self-pay

## 2017-01-13 ENCOUNTER — Other Ambulatory Visit: Payer: Self-pay | Admitting: Family

## 2017-01-13 DIAGNOSIS — E119 Type 2 diabetes mellitus without complications: Secondary | ICD-10-CM

## 2017-01-13 NOTE — Telephone Encounter (Signed)
Received fax from [facility] requesting Ibuprofen 600mg  1 q8h prn #90. Please advise

## 2017-01-13 NOTE — Telephone Encounter (Signed)
Please call pt. Is she taking mobic and Ibuprofen? Pt can not be on both of these. Which ever script pt is taking please remove other from med list and call in rx to her pharmacy. Thanks!

## 2017-01-14 NOTE — Telephone Encounter (Signed)
Pt has been taking both medications She will d/c Ibuprofen If any problems she will schedule appt with Evelina Dun

## 2017-01-14 NOTE — Telephone Encounter (Signed)
Aware, no refill on ibuprofen but other script sent in.

## 2017-01-14 NOTE — Telephone Encounter (Signed)
Aware. 

## 2017-01-21 ENCOUNTER — Other Ambulatory Visit: Payer: Self-pay | Admitting: Family

## 2017-02-04 ENCOUNTER — Other Ambulatory Visit: Payer: Self-pay | Admitting: Family

## 2017-02-04 DIAGNOSIS — Z1231 Encounter for screening mammogram for malignant neoplasm of breast: Secondary | ICD-10-CM

## 2017-02-08 ENCOUNTER — Telehealth (HOSPITAL_COMMUNITY): Payer: Self-pay | Admitting: *Deleted

## 2017-02-08 ENCOUNTER — Ambulatory Visit (HOSPITAL_COMMUNITY)
Admission: RE | Admit: 2017-02-08 | Discharge: 2017-02-08 | Disposition: A | Discharge status: Home / Self Care | Payer: Medicaid Other | Source: Ambulatory Visit | Attending: Family | Admitting: Family

## 2017-02-08 ENCOUNTER — Encounter (HOSPITAL_COMMUNITY): Payer: Self-pay

## 2017-02-08 DIAGNOSIS — Z1231 Encounter for screening mammogram for malignant neoplasm of breast: Secondary | ICD-10-CM | POA: Diagnosis not present

## 2017-02-08 HISTORY — DX: Personal history of antineoplastic chemotherapy: Z92.21

## 2017-02-08 HISTORY — DX: Personal history of irradiation: Z92.3

## 2017-02-08 NOTE — Telephone Encounter (Signed)
left voice message regarding an appointment. 

## 2017-02-18 ENCOUNTER — Other Ambulatory Visit: Payer: Self-pay | Admitting: Family

## 2017-02-18 DIAGNOSIS — E119 Type 2 diabetes mellitus without complications: Secondary | ICD-10-CM

## 2017-02-22 ENCOUNTER — Telehealth: Payer: Self-pay

## 2017-02-22 NOTE — Telephone Encounter (Signed)
lmtcb

## 2017-02-22 NOTE — Telephone Encounter (Signed)
Pt does not have a diagnoses of Rheumatoid arthritis.

## 2017-03-02 NOTE — Telephone Encounter (Signed)
lmtcb

## 2017-03-05 NOTE — Telephone Encounter (Signed)
Patient states that she has arthritis in her neck and back and would like a referral to rheumatology

## 2017-03-05 NOTE — Telephone Encounter (Signed)
Patient NTBS  

## 2017-03-05 NOTE — Telephone Encounter (Signed)
Patient aware that she needs to be seen. Appointment scheduled for patient

## 2017-03-09 ENCOUNTER — Ambulatory Visit (INDEPENDENT_AMBULATORY_CARE_PROVIDER_SITE_OTHER): Payer: Medicaid Other | Admitting: Family

## 2017-03-09 ENCOUNTER — Encounter: Payer: Self-pay | Admitting: Family

## 2017-03-09 VITALS — BP 91/60 | HR 91 | Temp 98.7°F | Ht 67.0 in | Wt 182.0 lb

## 2017-03-09 DIAGNOSIS — F411 Generalized anxiety disorder: Secondary | ICD-10-CM

## 2017-03-09 DIAGNOSIS — M150 Primary generalized (osteo)arthritis: Secondary | ICD-10-CM

## 2017-03-09 DIAGNOSIS — M8949 Other hypertrophic osteoarthropathy, multiple sites: Secondary | ICD-10-CM

## 2017-03-09 DIAGNOSIS — F339 Major depressive disorder, recurrent, unspecified: Secondary | ICD-10-CM | POA: Diagnosis not present

## 2017-03-09 DIAGNOSIS — M159 Polyosteoarthritis, unspecified: Secondary | ICD-10-CM

## 2017-03-09 MED ORDER — CELECOXIB 100 MG PO CAPS: 100.0000 mg | ORAL_CAPSULE | Freq: Two times a day (BID) | ORAL | 1 refills | Status: DC

## 2017-03-09 NOTE — Patient Instructions (Signed)
Osteoarthritis  Osteoarthritis is a type of arthritis that affects tissue that covers the ends of bones in joints (cartilage). Cartilage acts as a cushion between the bones and helps them move smoothly. Osteoarthritis results when cartilage in the joints gets worn down. Osteoarthritis is sometimes called "wear and tear" arthritis.  Osteoarthritis is the most common form of arthritis. It often occurs in older people. It is a condition that gets worse over time (a progressive condition). Joints that are most often affected by this condition are in:  · Fingers.  · Toes.  · Hips.  · Knees.  · Spine, including neck and lower back.    What are the causes?  This condition is caused by age-related wearing down of cartilage that covers the ends of bones.  What increases the risk?  The following factors may make you more likely to develop this condition:  · Older age.  · Being overweight or obese.  · Overuse of joints, such as in athletes.  · Past injury of a joint.  · Past surgery on a joint.  · Family history of osteoarthritis.    What are the signs or symptoms?  The main symptoms of this condition are pain, swelling, and stiffness in the joint. The joint may lose its shape over time. Small pieces of bone or cartilage may break off and float inside of the joint, which may cause more pain and damage to the joint. Small deposits of bone (osteophytes) may grow on the edges of the joint. Other symptoms may include:  · A grating or scraping feeling inside the joint when you move it.  · Popping or creaking sounds when you move.    Symptoms may affect one or more joints. Osteoarthritis in a major joint, such as your knee or hip, can make it painful to walk or exercise. If you have osteoarthritis in your hands, you might not be able to grip items, twist your hand, or control small movements of your hands and fingers (fine motor skills).  How is this diagnosed?  This condition may be diagnosed based on:  · Your medical history.  · A  physical exam.  · Your symptoms.  · X-rays of the affected joint(s).  · Blood tests to rule out other types of arthritis.    How is this treated?  There is no cure for this condition, but treatment can help to control pain and improve joint function. Treatment plans may include:  · A prescribed exercise program that allows for rest and joint relief. You may work with a physical therapist.  · A weight control plan.  · Pain relief techniques, such as:  ? Applying heat and cold to the joint.  ? Electric pulses delivered to nerve endings under the skin (transcutaneous electrical nerve stimulation, or TENS).  ? Massage.  ? Certain nutritional supplements.  · NSAIDs or prescription medicines to help relieve pain.  · Medicine to help relieve pain and inflammation (corticosteroids). This can be given by mouth (orally) or as an injection.  · Assistive devices, such as a brace, wrap, splint, specialized glove, or cane.  · Surgery, such as:  ? An osteotomy. This is done to reposition the bones and relieve pain or to remove loose pieces of bone and cartilage.  ? Joint replacement surgery. You may need this surgery if you have very bad (advanced) osteoarthritis.    Follow these instructions at home:  Activity   · Rest your affected joints as directed by your   health care provider.  · Do not drive or use heavy machinery while taking prescription pain medicine.  · Exercise as directed. Your health care provider or physical therapist may recommend specific types of exercise, such as:  ? Strengthening exercises. These are done to strengthen the muscles that support joints that are affected by arthritis. They can be performed with weights or with exercise bands to add resistance.  ? Aerobic activities. These are exercises, such as brisk walking or water aerobics, that get your heart pumping.  ? Range-of-motion activities. These keep your joints easy to move.  ? Balance and agility exercises.  Managing pain, stiffness, and swelling    · If directed, apply heat to the affected area as often as told by your health care provider. Use the heat source that your health care provider recommends, such as a moist heat pack or a heating pad.  ? If you have a removable assistive device, remove it as told by your health care provider.  ? Place a towel between your skin and the heat source. If your health care provider tells you to keep the assistive device on while you apply heat, place a towel between the assistive device and the heat source.  ? Leave the heat on for 20-30 minutes.  ? Remove the heat if your skin turns bright red. This is especially important if you are unable to feel pain, heat, or cold. You may have a greater risk of getting burned.  · If directed, put ice on the affected joint:  ? If you have a removable assistive device, remove it as told by your health care provider.  ? Put ice in a plastic bag.  ? Place a towel between your skin and the bag. If your health care provider tells you to keep the assistive device on during icing, place a towel between the assistive device and the bag.  ? Leave the ice on for 20 minutes, 2-3 times a day.  General instructions   · Take over-the-counter and prescription medicines only as told by your health care provider.  · Maintain a healthy weight. Follow instructions from your health care provider for weight control. These may include dietary restrictions.  · Do not use any products that contain nicotine or tobacco, such as cigarettes and e-cigarettes. These can delay bone healing. If you need help quitting, ask your health care provider.  · Use assistive devices as directed by your health care provider.  · Keep all follow-up visits as told by your health care provider. This is important.  Where to find more information:  · National Institute of Arthritis and Musculoskeletal and Skin Diseases: www.niams.nih.gov  · National Institute on Aging: www.nia.nih.gov  · American College of Rheumatology:  www.rheumatology.org  Contact a health care provider if:  · Your skin turns red.  · You develop a rash.  · You have pain that gets worse.  · You have a fever along with joint or muscle aches.  Get help right away if:  · You lose a lot of weight.  · You suddenly lose your appetite.  · You have night sweats.  Summary  · Osteoarthritis is a type of arthritis that affects tissue covering the ends of bones in joints (cartilage).  · This condition is caused by age-related wearing down of cartilage that covers the ends of bones.  · The main symptom of this condition is pain, swelling, and stiffness in the joint.  · There is no cure for this   condition, but treatment can help to control pain and improve joint function.  This information is not intended to replace advice given to you by your health care provider. Make sure you discuss any questions you have with your health care provider.  Document Released: 12/07/2005 Document Revised: 08/10/2016 Document Reviewed: 08/10/2016  Elsevier Interactive Patient Education © 2017 Elsevier Inc.

## 2017-03-09 NOTE — Progress Notes (Signed)
   Subjective:    Patient ID: [name], female    DOB: [birth year], 61 y.o.   MRN: [id number]  Pt presents to the office today generalized joint pain in her neck and back. PT complaining of increased anxiety with highs and lows. PT reports when she gets in these lows she steals things. Pt states her stealing makes her nervous that she will go to jail. Pt currently taking Wellbutrin 300 mg and Prozac 40 mg. Pt has seen a psychiatry.  Arthritis  Presents for follow-up visit. She complains of pain and stiffness. Affected locations include the neck (upper back). Her pain is at a severity of 10/10. Associated symptoms include pain at night and pain while resting. Pertinent negatives include no dysuria.      Review of Systems  Genitourinary: Negative for dysuria.  Musculoskeletal: Positive for arthritis and stiffness.  All other systems reviewed and are negative.      Objective:   Physical Exam  Constitutional: She is oriented to person, place, and time. She appears well-developed and well-nourished. No distress.  HENT:  Head: Normocephalic and atraumatic.  Eyes: Pupils are equal, round, and reactive to light.  Neck: Normal range of motion. Neck supple. No thyromegaly present.  Cardiovascular: Normal rate, regular rhythm, normal heart sounds and intact distal pulses.   No murmur heard. Pulmonary/Chest: Effort normal and breath sounds normal. No respiratory distress. She has no wheezes.  Abdominal: Soft. Bowel sounds are normal. She exhibits no distension. There is no tenderness.  Musculoskeletal: Normal range of motion. She exhibits tenderness. She exhibits no edema.  Tenderness in neck and upper back with rotation and flexion   Neurological: She is alert and oriented to person, place, and time.  Skin: Skin is warm and dry.  Psychiatric: Her behavior is normal. Judgment and thought content normal. Her mood appears anxious. She exhibits a depressed mood.  Vitals  reviewed.     BP 91/60   Pulse 91   Temp 98.7 F (37.1 C) (Oral)   Ht 5\' 7"  (1.702 m)   Wt 182 lb (82.6 kg)   BMI 28.51 kg/m      Assessment & Plan:  1. Primary osteoarthritis involving multiple joints -Stop Mobic and start celebrex No other NSAIDs ROM exercises encouraged - Arthritis Panel - celecoxib (CELEBREX) 100 MG capsule; Take 1 capsule (100 mg total) by mouth 2 (two) times daily.  Dispense: 90 capsule; Refill: 1  2. GAD (generalized anxiety disorder) - Ambulatory referral to Psychiatry  3. Depression, recurrent ([place])  - Ambulatory referral to Psychiatry  Continue meds and pt needs to follow up with Psyh. Pt would benefit from counseling and medications changes.  Stress management   >30 mins spent with pt counseling and discussing GAD and depression  Evelina Dun, FNP

## 2017-03-10 LAB — ARTHRITIS PANEL
BASOS: 0 %
Basophils Absolute: 0 10*3/uL (ref 0.0–0.2)
EOS (ABSOLUTE): 0.2 10*3/uL (ref 0.0–0.4)
Eos: 4 %
HEMOGLOBIN: 12.3 g/dL (ref 11.1–15.9)
Hematocrit: 37.4 % (ref 34.0–46.6)
IMMATURE GRANS (ABS): 0 10*3/uL (ref 0.0–0.1)
Immature Granulocytes: 0 %
LYMPHS ABS: 1.6 10*3/uL (ref 0.7–3.1)
LYMPHS: 28 %
MCH: 30.1 pg (ref 26.6–33.0)
MCHC: 32.9 g/dL (ref 31.5–35.7)
MCV: 92 fL (ref 79–97)
MONOS ABS: 0.4 10*3/uL (ref 0.1–0.9)
Monocytes: 6 %
NEUTROS ABS: 3.6 10*3/uL (ref 1.4–7.0)
Neutrophils: 62 %
Platelets: 302 10*3/uL (ref 150–379)
RBC: 4.08 x10E6/uL (ref 3.77–5.28)
RDW: 13.4 % (ref 12.3–15.4)
Sed Rate: 2 mm/hr (ref 0–40)
Uric Acid: 4.1 mg/dL (ref 2.5–7.1)
WBC: 5.8 10*3/uL (ref 3.4–10.8)

## 2017-03-11 ENCOUNTER — Other Ambulatory Visit: Payer: Self-pay | Admitting: Family

## 2017-03-17 ENCOUNTER — Telehealth: Payer: Self-pay | Admitting: Family

## 2017-03-17 DIAGNOSIS — M8949 Other hypertrophic osteoarthropathy, multiple sites: Secondary | ICD-10-CM

## 2017-03-17 DIAGNOSIS — M159 Polyosteoarthritis, unspecified: Secondary | ICD-10-CM

## 2017-03-17 DIAGNOSIS — M150 Primary generalized (osteo)arthritis: Principal | ICD-10-CM

## 2017-03-17 MED ORDER — CELECOXIB 100 MG PO CAPS: 100.0000 mg | ORAL_CAPSULE | Freq: Two times a day (BID) | ORAL | 1 refills | Status: DC

## 2017-03-17 NOTE — Progress Notes (Deleted)
Psychiatric Initial Adult Assessment   Patient Identification: [name] MRN:  [id number] Date of Evaluation:  03/17/2017 Referral Source: [facility] [facility] Chief Complaint:   Visit Diagnosis: No diagnosis found.  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   stealing  Associated Signs/Symptoms: Depression Symptoms:  {DEPRESSION SYMPTOMS:20000} (Hypo) Manic Symptoms:  {BHH MANIC SYMPTOMS:22872} Anxiety Symptoms:  {BHH ANXIETY SYMPTOMS:22873} Psychotic Symptoms:  {BHH PSYCHOTIC SYMPTOMS:22874} PTSD Symptoms: {BHH PTSD SYMPTOMS:22875}  Past Psychiatric History: ***  Previous Psychotropic Medications: {YES/NO:21197}  Substance Abuse History in the last 12 months:  {yes no:314532}  Consequences of Substance Abuse: {BHH CONSEQUENCES OF SUBSTANCE ABUSE:22880}  Past Medical History:  Past Medical History:  Diagnosis Date  . Adenoid cystic carcinoma of submandibular gland [facility]) dx 2016---  oncologist-  dr Eppie Gibson [facility])   Left submandibular salivary gland--  T1 N0 M0,  Grade 2 w/ perineural invasion  s/p  resection 03-05-2015  and Radiation therapy 04-29-2015 to 06-11-2015  . Allergic rhinitis   . Anemia   . Arthritis   . Bipolar disorder ([place])    [facility]  . Breast cancer ([place])   . COPD (chronic obstructive pulmonary disease) ([place])   . Cystocele   . Decreased sense of taste    secondary to radiation  . GAD (generalized anxiety disorder)   . GERD (gastroesophageal reflux disease)   . Headache   . Hemorrhoid    INTERNAL AND EXTERNAL    . History of adenomatous polyp of colon   . History of breast cancer dx 2007--- oncologist-- dr Lollie Marrow--- no recurrence   Right breast DCIS ,  Stage 2 (T2 N0 M0) --  s/p  right partial mastecotmy w/ sln  dissection,  chemotherapy complete 02-01-2007,  radiation completed 06-15-2007  . History of esophageal dilatation   . History of esophagitis   . History of external beam radiation therapy    04-27-2007 to 06-15-2007 , right breast- 5040cGy in 28 sessions and boost 1200 cGy in 6 sessions/   04-29-2015 to 06-11-2015 , left neck and skull base -- 60 Gy in 30 fractions  . History of hiatal hernia   . History of panic attacks   . History of perforation of tympanic membrane    AFTER ACUTE OTITIS MEDIA 2016-- RESOLVED  . Hypertriglyceridemia    ?  Marland Kitchen Loss of teeth due to extraction    secondary to radiation therapy---  x8 or 9  . Mouth dryness    secondary to radiation  . Neck pain    resuidaul neck pain from radiation  . Peripheral neuropathy ([place])   . Personal history of chemotherapy   . Personal history of radiation therapy   . TMJ (temporomandibular joint disorder)   . Type 2 diabetes mellitus ([place])   . Urge and stress incontinence   . Wears glasses     Past Surgical History:  Procedure Laterality Date  . COLONOSCOPY  03/30/2012   RMR: rectal and colonic polyps -removed as described above. Mellanosis coli  . COLONOSCOPY WITH PROPOFOL N/A 05/25/2016   Procedure: COLONOSCOPY WITH PROPOFOL;  Surgeon: Daneil Dolin, MD;  [facility];  Service: Endoscopy;  Laterality: N/A;  1400   . ECTOPIC PREGNANCY SURGERY    . ESOPHAGOGASTRODUODENOSCOPY (EGD) WITH PROPOFOL N/A 05/25/2016   Rourk: small hh, status  post dilation of Schatzki ring, normal colon. Next colonoscopy June 2022  . EXCISION / BIOPSY BREAST / NIPPLE / DUCT    . INTERSTIM IMPLANT PLACEMENT N/A 08/11/2016   Serial Number [id number] H.Procedure: INTERSTIM IMPLANT FIRST STAGE;  Surgeon: Bjorn Loser, MD;  Location[facility];  Service: Urology;  Laterality: N/A;  . INTERSTIM IMPLANT PLACEMENT N/A 08/11/2016   Procedure: Barrie Lyme IMPLANT SECOND STAGE IMPEDANCE CHECK;  Surgeon: Bjorn Loser, MD;   Location: [facility];  Service: Urology;  Laterality: N/A;  . Venia Minks DILATION N/A 05/25/2016   Procedure: Venia Minks DILATION;  Surgeon: Daneil Dolin, MD;  [facility];  Service: Endoscopy;  Laterality: N/A;  . PARTIAL MASTECTOMY WITH AXILLARY SENTINEL LYMPH NODE BIOPSY Right 12/01/2006   and Right axilla node dissection x1  . PORT-A-CATH PLACEMENT AND REMOVAL  12-31-2006/  05-13-2007  . SUBMANDIBULAR GLAND EXCISION Left 03/05/2015   adenoid cystic carcinoma  . TRANSTHORACIC ECHOCARDIOGRAM  12/29/2006   normal echo, ef 55%  . TUBAL LIGATION  yrs ago  . VAGINAL HYSTERECTOMY  10-26-2007  dr Elonda Husky   w/ Anterior Repair with intraspinous graft and vagainal vault suspension    Family Psychiatric History: ***  Family History:  Family History  Problem Relation Age of Onset  . Diabetes Mother   . Hypertension Mother   . Cancer Mother     unknown primary  . Bipolar disorder Mother   . Colon cancer Paternal Grandmother 54    deceased  . Diabetes Daughter 7    type 1  . Depression Sister   . Bipolar disorder Paternal Grandfather   . Depression Sister   . Liver disease Neg Hx     Social History:   Social History   Social History  . Marital status: Married    Spouse name: N/A  . Number of children: 3  . Years of education: N/A   Social History Main Topics  . Smoking status: Former Smoker    Packs/day: 1.50    Years: 25.00    Types: Cigarettes    Quit date: 01/09/1996  . Smokeless tobacco: Never Used  . Alcohol use No  . Drug use: No  . Sexual activity: No   Other Topics Concern  . Not on file   Social History Narrative  . No narrative on file    Additional Social History: ***  Allergies:   Allergies  Allergen Reactions  . Penicillins Anaphylaxis and Swelling    Has patient had a PCN reaction causing immediate rash, facial/tongue/throat swelling, SOB or lightheadedness with hypotension: Yes Has patient had a PCN reaction causing severe  rash involving mucus membranes or skin necrosis: No Has patient had a PCN reaction that required hospitalization Yes Has patient had a PCN reaction occurring within the last 10 years: No If all of the above answers are "NO", then may proceed with Cephalosporin use.   . Bee Venom Hives and Swelling    Bee stings     Metabolic Disorder Labs: Lab Results  Component Value Date   HGBA1C 5.6 12/27/2015   No results found for: PROLACTIN Lab Results  Component Value Date   CHOL 212 (H) 01/05/2017   TRIG 108 01/05/2017   HDL 67 01/05/2017   CHOLHDL 3.2 01/05/2017   LDLCALC 123 (H) 01/05/2017   LDLCALC 132 (H) 10/05/2016     Current Medications: Current Outpatient Prescriptions  Medication Sig Dispense Refill  . ACCU-CHEK AVIVA PLUS test strip USE TO TEST BLOOD GLUCOSE WITH  TWO TO THREE TIMES A DAY AS DIRECTED 100 each 5  . ACCU-CHEK SOFTCLIX LANCETS lancets USE TO TEST BLOOD GLUCOSE THREE TIMES DAILY AS DIRECTED 100 each 5  . ALPRAZolam (XANAX) 1 MG tablet Take 1 tablet (1 mg total) by mouth 2 (two) times daily. 60 tablet 3  . buPROPion (WELLBUTRIN XL) 300 MG 24 hr tablet Take 1 tablet (300 mg total) by mouth every morning. 90 tablet 1  . celecoxib (CELEBREX) 100 MG capsule Take 1 capsule (100 mg total) by mouth 2 (two) times daily. 90 capsule 1  . FLUoxetine (PROZAC) 40 MG capsule Take 1 capsule (40 mg total) by mouth daily. 90 capsule 3  . fluticasone (FLONASE) 50 MCG/ACT nasal spray INSTILL 2 SPRAYS INTO EACH NOSTRIL EVERY DAY 48 g 5  . Fluticasone-Salmeterol (ADVAIR DISKUS) 250-50 MCG/DOSE AEPB Inhale 1 puff into the lungs 2 (two) times daily. 90 each 3  . loratadine (CLARITIN) 10 MG tablet Take 1 tablet (10 mg total) by mouth daily. 90 tablet 1  . lubiprostone (AMITIZA) 8 MCG capsule Take 1 capsule (8 mcg total) by mouth 2 (two) times daily with a meal. 60 capsule 3  . magic mouthwash SOLN Take 5 mLs by mouth 4 (four) times daily as needed for mouth pain. 500 mL 6  . meloxicam  (MOBIC) 15 MG tablet TAKE 1 TABLET BY MOUTH EVERY DAY 30 tablet 0  . metFORMIN (GLUCOPHAGE) 1000 MG tablet TAKE 1 TABLET BY MOUTH TWICE DAILY 180 tablet 0  . mirabegron ER ([name]) 50 MG TB24 tablet Take 1 tablet (50 mg total) by mouth daily. 90 tablet 1  . omeprazole (PRILOSEC) 40 MG capsule TAKE 1 CAPSULE BY MOUTH EVERY DAY---  takes in pm 90 capsule 1  . pravastatin (PRAVACHOL) 40 MG tablet Take 1 tablet (40 mg total) by mouth daily. 90 tablet 2  . PROAIR HFA 108 (90 Base) MCG/ACT inhaler INHALE 2 PUFFS EVERY 4 TO 6 HOURS AS NEEDED 25.5 g 3  . sodium fluoride (FLUORISHIELD) 1.1 % GEL dental gel Instill one drop of gel per tooth space of fluoride tray. Place over teeth for 5 minutes. Remove. Spit out excess. Repeat nightly. 120 mL 11  . tiotropium (SPIRIVA HANDIHALER) 18 MCG inhalation capsule Place 1 capsule (18 mcg total) into inhaler and inhale daily. 90 capsule 1  . traZODone (DESYREL) 150 MG tablet Take 1 tablet (150 mg total) by mouth at bedtime. 90 tablet 1  . Vitamin D, Ergocalciferol, (DRISDOL) 50000 units CAPS capsule TAKE 1 CAPSULE BY MOUTH ONCE A WEEK 12 capsule 3   No current facility-administered medications for this visit.     Neurologic: Headache: No Seizure: No Paresthesias:No  Musculoskeletal: Strength & Muscle Tone: within normal limits Gait & Station: normal Patient leans: N/A  Psychiatric Specialty Exam: ROS  There were no vitals taken for this visit.There is no height or weight on file to calculate BMI.  General Appearance: Fairly Groomed  Eye Contact:  Good  Speech:  Clear and Coherent  Volume:  Normal  Mood:  {BHH MOOD:22306}  Affect:  {Affect (PAA):22687}  Thought Process:  Coherent and Goal Directed  Orientation:  Full (Time, Place, and Person)  Thought Content:  Logical  Suicidal Thoughts:  {ST/HT (PAA):22692}  Homicidal Thoughts:  {ST/HT (PAA):22692}  Memory:  Immediate;   Good Recent;   Good Remote;   Good  Judgement:  {Judgement (PAA):22694}   Insight:  {Insight (PAA):22695}  Psychomotor Activity:  Normal  Concentration:  Concentration: Good and Attention  Span: Good  Recall:  Good  Fund of Knowledge:Good  Language: Good  Akathisia:  No  Handed:  Right  AIMS (if indicated):  N/A  Assets:  Communication Skills Desire for Improvement  ADL's:  Intact  Cognition: WNL  Sleep:  ***   Assessment  Plan  The patient demonstrates the following risk factors for suicide: Chronic risk factors for suicide include: {Chronic Risk Factors for DSKAJGO:11572620}. Acute risk factors for suicide include: {Acute Risk Factors for BTDHRCB:63845364}. Protective factors for this patient include: {Protective Factors for Suicide WOEH:21224825}. Considering these factors, the overall suicide risk at this point appears to be {Desc; low/moderate/high:110033}. Patient {ACTION; IS/IS OIB:70488891} appropriate for outpatient follow up.   Treatment Plan Summary: {CHL AMB [facility] MD [place] QXIH:[id number]}   Norman Clay, MD 3/28/20189:58 AM

## 2017-03-17 NOTE — Telephone Encounter (Signed)
Script for celebrex had been originally sent to [facility].  New script sent to [facility]. Patient aware.

## 2017-03-18 ENCOUNTER — Ambulatory Visit (HOSPITAL_COMMUNITY): Payer: Self-pay | Admitting: Psychiatry
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
## 2017-03-23 ENCOUNTER — Ambulatory Visit (INDEPENDENT_AMBULATORY_CARE_PROVIDER_SITE_OTHER): Payer: Medicaid Other | Admitting: Psychiatry

## 2017-03-23 ENCOUNTER — Encounter (HOSPITAL_COMMUNITY): Payer: Self-pay | Admitting: Psychiatry

## 2017-03-23 VITALS — BP 133/77 | HR 78 | Ht 67.5 in | Wt 182.2 lb

## 2017-03-23 DIAGNOSIS — Z79899 Other long term (current) drug therapy: Secondary | ICD-10-CM | POA: Diagnosis not present

## 2017-03-23 DIAGNOSIS — Z87891 Personal history of nicotine dependence: Secondary | ICD-10-CM | POA: Diagnosis not present

## 2017-03-23 DIAGNOSIS — E118 Type 2 diabetes mellitus with unspecified complications: Secondary | ICD-10-CM

## 2017-03-23 DIAGNOSIS — Z818 Family history of other mental and behavioral disorders: Secondary | ICD-10-CM | POA: Diagnosis not present

## 2017-03-23 DIAGNOSIS — F4310 Post-traumatic stress disorder, unspecified: Secondary | ICD-10-CM | POA: Diagnosis not present

## 2017-03-23 DIAGNOSIS — M1990 Unspecified osteoarthritis, unspecified site: Secondary | ICD-10-CM

## 2017-03-23 DIAGNOSIS — F411 Generalized anxiety disorder: Secondary | ICD-10-CM | POA: Diagnosis not present

## 2017-03-23 DIAGNOSIS — F331 Major depressive disorder, recurrent, moderate: Secondary | ICD-10-CM | POA: Diagnosis not present

## 2017-03-23 DIAGNOSIS — Z853 Personal history of malignant neoplasm of breast: Secondary | ICD-10-CM

## 2017-03-23 MED ORDER — BUPROPION HCL ER (XL) 300 MG PO TB24: 300.0000 mg | ORAL_TABLET | Freq: Every morning | ORAL | 0 refills | Status: DC

## 2017-03-23 MED ORDER — ARIPIPRAZOLE 2 MG PO TABS: 2.0000 mg | ORAL_TABLET | Freq: Every day | ORAL | 0 refills | Status: DC

## 2017-03-23 MED ORDER — FLUOXETINE HCL 40 MG PO CAPS: 40.0000 mg | ORAL_CAPSULE | Freq: Every day | ORAL | 0 refills | Status: DC

## 2017-03-23 MED ORDER — LORAZEPAM 1 MG PO TABS: 1.0000 mg | ORAL_TABLET | Freq: Two times a day (BID) | ORAL | 0 refills | Status: DC | PRN

## 2017-03-23 NOTE — Patient Instructions (Signed)
1. Continue fluoxetine 40 mg daily 2. Continue Wellbutrin 300 mg daily 3. Continue Trazodone 150 mg at night as needed for sleep 4. Start Abilify 2 mg daily 5. Discontinue Xanax 6. Start Ativan 1 mg twice a day as needed for anxiety 7. Return to clinic in one month for 30 mins 8. Contact for therapy: Dr. Alford Highland Schneidmiller  [phone] [address] [place], [place] [address] 9. Try to take a walk 15 mins every day.  10. Keep diary for writing down three good things you did in a day and the reason you could do it

## 2017-04-06 ENCOUNTER — Telehealth: Payer: Self-pay | Admitting: Family

## 2017-04-06 NOTE — Telephone Encounter (Signed)
Appt made to see [name] to discuss diet

## 2017-04-09 ENCOUNTER — Ambulatory Visit (INDEPENDENT_AMBULATORY_CARE_PROVIDER_SITE_OTHER): Payer: Medicaid Other | Admitting: Family Medicine

## 2017-04-09 VITALS — BP 121/72 | HR 94 | Temp 97.5°F | Ht 67.0 in | Wt 184.8 lb

## 2017-04-09 DIAGNOSIS — R682 Dry mouth, unspecified: Secondary | ICD-10-CM | POA: Diagnosis not present

## 2017-04-09 DIAGNOSIS — K117 Disturbances of salivary secretion: Secondary | ICD-10-CM

## 2017-04-09 MED ORDER — CLOTRIMAZOLE 10 MG MT TROC: 10.0000 mg | Freq: Every day | OROMUCOSAL | 0 refills | Status: DC

## 2017-04-09 MED ORDER — PILOCARPINE HCL 5 MG PO TABS: 5.0000 mg | ORAL_TABLET | Freq: Three times a day (TID) | ORAL | 0 refills | Status: DC

## 2017-04-09 NOTE — Progress Notes (Signed)
   HPI  Patient presents today with dry mouth.  Patient explains she said 2 weeks of cough and congestion, however the over the last 4 days she's had nausea, tongue pain, and irritation in her mouth.  She has a history of oral cancer treated with radiation and has had difficulty with dry mouth since that time.  She requests a prescription for pilocarpine  PMH: Smoking status noted ROS: Per HPI  Objective: BP 121/72   Pulse 94   Temp 97.5 F (36.4 C) (Oral)   Ht 5\' 7"  (1.702 m)   Wt 184 lb 12.8 oz (83.8 kg)   BMI 28.94 kg/m  Gen: NAD, alert, cooperative with exam HEENT: NCAT, TMs normal bilaterally, oropharynx dry with a small white patch on the tongue with no erythema CV: RRR, good S1/S2, no murmur Resp: CTABL, no wheezes, non-labored Ext: No edema, warm Neuro: Alert and oriented, No gross deficits  Assessment and plan:  # Oral irritation Possibly thrush, treat with clotrimazole troche Continue Magic mouthwash and Biotene as needed  # Xerostomia Trial of pilocarpine, discussed with patient high rate of side effect including nausea, increased urinary frequency, and hot flashes   Meds ordered this encounter  Medications  . clotrimazole (MYCELEX) 10 MG troche    Sig: Take 1 tablet (10 mg total) by mouth 5 (five) times daily.    Dispense:  70 tablet    Refill:  0  . pilocarpine (SALAGEN) 5 MG tablet    Sig: Take 1 tablet (5 mg total) by mouth 3 (three) times daily.    Dispense:  90 tablet    Refill:  0    Laroy Apple, MD [facility] 04/09/2017, 5:39 PM

## 2017-04-09 NOTE — Patient Instructions (Signed)
Great to see you!  Try the clotrimazole 5 times daily for 14 days  Try pilocarpine for the salivary difficulty. Note that it may cause nausea, increased urinary frequency, or hot flashes.

## 2017-04-10 MED ORDER — CLOTRIMAZOLE 10 MG MT TROC: 10.0000 mg | Freq: Every day | OROMUCOSAL | 0 refills | Status: DC

## 2017-04-10 MED ORDER — PILOCARPINE HCL 5 MG PO TABS: 5.0000 mg | ORAL_TABLET | Freq: Three times a day (TID) | ORAL | 0 refills | Status: DC

## 2017-04-10 NOTE — Addendum Note (Signed)
Addended by: Marylin Crosby on: 04/10/2017 08:29 AM   Modules accepted: Orders

## 2017-04-12 ENCOUNTER — Other Ambulatory Visit: Payer: Self-pay | Admitting: Family

## 2017-04-19 ENCOUNTER — Ambulatory Visit (INDEPENDENT_AMBULATORY_CARE_PROVIDER_SITE_OTHER): Payer: Medicaid Other | Admitting: Pharmacist

## 2017-04-19 VITALS — Ht 67.0 in | Wt 184.0 lb

## 2017-04-19 DIAGNOSIS — E663 Overweight: Secondary | ICD-10-CM | POA: Diagnosis not present

## 2017-04-19 DIAGNOSIS — E119 Type 2 diabetes mellitus without complications: Secondary | ICD-10-CM

## 2017-04-19 DIAGNOSIS — R633 Feeding difficulties: Secondary | ICD-10-CM

## 2017-04-19 DIAGNOSIS — K0889 Other specified disorders of teeth and supporting structures: Secondary | ICD-10-CM
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
## 2017-04-20 ENCOUNTER — Ambulatory Visit (INDEPENDENT_AMBULATORY_CARE_PROVIDER_SITE_OTHER): Payer: Medicaid Other | Admitting: Psychiatry

## 2017-04-20 ENCOUNTER — Encounter (HOSPITAL_COMMUNITY): Payer: Self-pay | Admitting: Psychiatry

## 2017-04-20 ENCOUNTER — Other Ambulatory Visit: Payer: Self-pay | Admitting: Family

## 2017-04-20 VITALS — BP 120/73 | HR 86 | Ht 67.0 in | Wt 185.2 lb

## 2017-04-20 DIAGNOSIS — Z87891 Personal history of nicotine dependence: Secondary | ICD-10-CM

## 2017-04-20 DIAGNOSIS — F411 Generalized anxiety disorder: Secondary | ICD-10-CM

## 2017-04-20 DIAGNOSIS — F331 Major depressive disorder, recurrent, moderate: Secondary | ICD-10-CM

## 2017-04-20 DIAGNOSIS — Z79899 Other long term (current) drug therapy: Secondary | ICD-10-CM | POA: Diagnosis not present

## 2017-04-20 DIAGNOSIS — F4310 Post-traumatic stress disorder, unspecified: Secondary | ICD-10-CM

## 2017-04-20 DIAGNOSIS — J449 Chronic obstructive pulmonary disease, unspecified: Secondary | ICD-10-CM

## 2017-04-20 DIAGNOSIS — Z818 Family history of other mental and behavioral disorders: Secondary | ICD-10-CM | POA: Diagnosis not present

## 2017-04-20 MED ORDER — FLUOXETINE HCL 40 MG PO CAPS: 40.0000 mg | ORAL_CAPSULE | Freq: Every day | ORAL | 1 refills | Status: DC

## 2017-04-20 MED ORDER — LORAZEPAM 1 MG PO TABS: 1.0000 mg | ORAL_TABLET | Freq: Two times a day (BID) | ORAL | 0 refills | Status: DC | PRN

## 2017-04-20 MED ORDER — ARIPIPRAZOLE 2 MG PO TABS: 2.0000 mg | ORAL_TABLET | Freq: Every day | ORAL | 1 refills | Status: DC

## 2017-04-20 MED ORDER — BUPROPION HCL ER (XL) 300 MG PO TB24: 300.0000 mg | ORAL_TABLET | Freq: Every morning | ORAL | 1 refills | Status: DC

## 2017-04-20 NOTE — Patient Instructions (Addendum)
1. Continue fluoxetine 40 mg daily 2. Continue Wellbutrin 300 mg daily 3. Continue Trazodone 150 mg at night as needed for sleep 4. Continue Abilify 2 mg daily 6. Continue Ativan 1 mg twice a day as needed for anxiety 7. Return to clinic in one month for 30 mins 9. Take a walk 15 mins every day.  10 Keep diary for writing down three good things you did in a day and the reason she could do it

## 2017-04-27 ENCOUNTER — Telehealth (HOSPITAL_COMMUNITY): Payer: Self-pay | Admitting: *Deleted

## 2017-04-27 ENCOUNTER — Other Ambulatory Visit (HOSPITAL_COMMUNITY): Payer: Self-pay | Admitting: Psychiatry

## 2017-04-27 ENCOUNTER — Encounter: Payer: Self-pay | Admitting: Internal Medicine

## 2017-04-27 DIAGNOSIS — G4700 Insomnia, unspecified: Secondary | ICD-10-CM

## 2017-04-27 MED ORDER — TRAZODONE HCL 100 MG PO TABS: ORAL_TABLET | ORAL | 1 refills | Status: DC

## 2017-04-27 NOTE — Telephone Encounter (Signed)
forwarded call to [facility].

## 2017-04-27 NOTE — Telephone Encounter (Signed)
patient's counselor, Ina Kick, left voice message, said patient is having trouble sleeping and needs someone to call her.   Phone call was returned to patient, no answer, left voice message.

## 2017-04-27 NOTE — Telephone Encounter (Signed)
Received a phone call. Patient states that she has insomnia with night time awakening over the past few weeks and she is not able to drive due to her insomnia. Upon reviewing the medication with patient, she is unsure if she takes Abilify only as needed. Reviewed each medication take at appropriate time. Discussed sleep hygiene. She is instructed to try take trazodone up to 200 mg total if she continues to have insomnia despite these changes. Discussed risk of somnolence.

## 2017-04-29 NOTE — Telephone Encounter (Signed)
Kept pt on hold until provider was available and transferred pt to provider for her to talk with provider.

## 2017-04-29 NOTE — Telephone Encounter (Signed)
Spoke with pt and she stated she is having a hard time sleeping. Per pt she is on sleeping medications but is only averaging about 3 hours of sleep per night.

## 2017-05-03 ENCOUNTER — Encounter: Payer: Self-pay | Admitting: Family

## 2017-05-03 ENCOUNTER — Ambulatory Visit (INDEPENDENT_AMBULATORY_CARE_PROVIDER_SITE_OTHER): Payer: Medicaid Other | Admitting: Family

## 2017-05-03 VITALS — BP 111/69 | HR 89 | Temp 97.7°F | Ht 67.0 in | Wt 188.0 lb

## 2017-05-03 DIAGNOSIS — E119 Type 2 diabetes mellitus without complications: Secondary | ICD-10-CM

## 2017-05-03 DIAGNOSIS — K219 Gastro-esophageal reflux disease without esophagitis: Secondary | ICD-10-CM | POA: Diagnosis not present

## 2017-05-03 DIAGNOSIS — N3946 Mixed incontinence: Secondary | ICD-10-CM | POA: Diagnosis not present

## 2017-05-03 DIAGNOSIS — J449 Chronic obstructive pulmonary disease, unspecified: Secondary | ICD-10-CM | POA: Diagnosis not present

## 2017-05-03 DIAGNOSIS — M1991 Primary osteoarthritis, unspecified site: Secondary | ICD-10-CM | POA: Diagnosis not present

## 2017-05-03 DIAGNOSIS — E559 Vitamin D deficiency, unspecified: Secondary | ICD-10-CM

## 2017-05-03 DIAGNOSIS — F331 Major depressive disorder, recurrent, moderate: Secondary | ICD-10-CM | POA: Diagnosis not present

## 2017-05-03 DIAGNOSIS — M1990 Unspecified osteoarthritis, unspecified site: Secondary | ICD-10-CM | POA: Insufficient documentation

## 2017-05-03 DIAGNOSIS — G4700 Insomnia, unspecified: Secondary | ICD-10-CM

## 2017-05-03 DIAGNOSIS — E782 Mixed hyperlipidemia: Secondary | ICD-10-CM

## 2017-05-03 DIAGNOSIS — K5900 Constipation, unspecified: Secondary | ICD-10-CM

## 2017-05-03 LAB — BAYER DCA HB A1C WAIVED: HB A1C (BAYER DCA - WAIVED): 6 % (ref <7.0)

## 2017-05-03 MED ORDER — CELECOXIB 200 MG PO CAPS: 200.0000 mg | ORAL_CAPSULE | Freq: Two times a day (BID) | ORAL | 1 refills | Status: DC

## 2017-05-03 NOTE — Progress Notes (Signed)
Subjective:    Patient ID: [name], female    DOB: 23-Feb-1956, 61 y.o.   MRN: [id number]  PT presents to the office today for chronic follow up. Pt history of Left submandibular cancer and is followed by them annually . Pt is currently seeing [facility] every month for depression, Insomnia, PTSD, and GAD. She is also followed by Urologists every 6 months. Pt has appt GI next week for dysphagia and colonic polyps and follow up with them every 6 months.  Diabetes  She presents for her follow-up diabetic visit. She has type 2 diabetes mellitus. Associated symptoms include polyphagia. Pertinent negatives for diabetes include no chest pain. Symptoms are stable. Risk factors for coronary artery disease include obesity. Her weight is stable. She is following a generally unhealthy diet. Her breakfast blood glucose range is generally 110-130 mg/dl.  Constipation  This is a chronic problem. The current episode started more than 1 year ago. The problem has been waxing and waning since onset. Her stool frequency is 4 to 5 times per week. She has tried laxatives (amitiza) for the symptoms. The treatment provided moderate relief.  Insomnia  Primary symptoms: sleep disturbance, difficulty falling asleep.  The current episode started more than one year. The onset quality is gradual. The problem occurs nightly. The problem has been waxing and waning since onset. The symptoms are aggravated by anxiety. The treatment provided moderate relief.  Hyperlipidemia  This is a chronic problem. The current episode started more than 1 year ago. The problem is uncontrolled. Recent lipid tests were reviewed and are high. Exacerbating diseases include obesity. Pertinent negatives include no chest pain. Current antihyperlipidemic treatment includes statins. The current treatment provides moderate improvement of lipids.  Gastroesophageal Reflux  She complains of dysphagia and heartburn. She reports no chest pain.  This is a chronic problem. The current episode started more than 1 year ago. The problem occurs frequently. The problem has been waxing and waning. She has tried a PPI for the symptoms. The treatment provided moderate relief.  Arthritis  Presents for follow-up visit. She complains of pain and stiffness. Affected locations include the neck (back). Her pain is at a severity of 10/10.  Mixed Incontinence PT is followed by Urologists every 6 months and has a TENS unit in place that has helped her greatly.  COPD  Pt taking Spiriva and Advair. States her breathing is stable.    Review of Systems  Cardiovascular: Negative for chest pain.  Gastrointestinal: Positive for constipation, dysphagia and heartburn.  Endocrine: Positive for polyphagia.  Musculoskeletal: Positive for arthritis and stiffness.  Psychiatric/Behavioral: Positive for sleep disturbance. The patient has insomnia.   All other systems reviewed and are negative.      Objective:   Physical Exam  Constitutional: She is oriented to person, place, and time. She appears well-developed and well-nourished. No distress.  HENT:  Head: Normocephalic.  Right Ear: External ear normal.  Left Ear: External ear normal.  Nose: Nose normal.  Mouth/Throat: Oropharynx is clear and moist.  Eyes: Pupils are equal, round, and reactive to light.  Neck: Normal range of motion. Neck supple. No thyromegaly present.  Cardiovascular: Normal rate, regular rhythm, normal heart sounds and intact distal pulses.   No murmur heard. Pulmonary/Chest: Effort normal and breath sounds normal. No respiratory distress. She has no wheezes.  Abdominal: Soft. Bowel sounds are normal. She exhibits no distension. There is no tenderness.  Musculoskeletal: Normal range of motion. She exhibits no edema or tenderness.  Neurological: She is alert and oriented to person, place, and time.  Skin: Skin is warm and dry.  Psychiatric: She has a normal mood and affect. Her  behavior is normal. Judgment and thought content normal.  Vitals reviewed.     BP 111/69   Pulse 89   Temp 97.7 F (36.5 C) (Oral)   Ht '5\' 7"'  (1.702 m)   Wt 188 lb (85.3 kg)   BMI 29.44 kg/m      Assessment & Plan:  1. Gastroesophageal reflux disease, esophagitis presence not specified - CMP14+EGFR  2. Constipation, unspecified constipation type - CMP14+EGFR  3. Type 2 diabetes mellitus without complication, without long-term current use of insulin (HCC) - CMP14+EGFR - Bayer DCA Hb A1c Waived  4. Insomnia, unspecified type - CMP14+EGFR  5. Mixed hyperlipidemia - CMP14+EGFR - Lipid panel  6. Major depressive disorder, recurrent episode, moderate (HCC) - CMP14+EGFR  7. Mixed incontinence urge and stress - CMP14+EGFR  8. Vitamin D deficiency - CMP14+EGFR  9. Primary osteoarthritis, unspecified site -Pt's Celebrex increased to 200 mg from 100 mg - CMP14+EGFR - celecoxib (CELEBREX) 200 MG capsule; Take 1 capsule (200 mg total) by mouth 2 (two) times daily.  Dispense: 180 capsule; Refill: 1  10. Chronic obstructive pulmonary disease, unspecified COPD type ([place])   Continue all meds and keep follow up appt wit specialists  Labs pending Health Maintenance reviewed Diet and exercise encouraged RTO 4 months   Evelina Dun, FNP

## 2017-05-03 NOTE — Patient Instructions (Signed)
Arthritis Arthritis is a term that is commonly used to refer to joint pain or joint disease. There are more than 100 types of arthritis. What are the causes? The most common cause of this condition is wear and tear of a joint. Other causes include:  Gout.  Inflammation of a joint.  An infection of a joint.  Sprains and other injuries near the joint.  A drug reaction or allergic reaction. In some cases, the cause may not be known. What are the signs or symptoms? The main symptom of this condition is pain in the joint with movement. Other symptoms include:  Redness, swelling, or stiffness at a joint.  Warmth coming from the joint.  Fever.  Overall feeling of illness. How is this diagnosed? This condition may be diagnosed with a physical exam and tests, including:  Blood tests.  Urine tests.  Imaging tests, such as MRI, X-rays, or a CT scan. Sometimes, fluid is removed from a joint for testing. How is this treated? Treatment for this condition may involve:  Treatment of the cause, if it is known.  Rest.  Raising (elevating) the joint.  Applying cold or hot packs to the joint.  Medicines to improve symptoms and reduce inflammation.  Injections of a steroid such as cortisone into the joint to help reduce pain and inflammation. Depending on the cause of your arthritis, you may need to make lifestyle changes to reduce stress on your joint. These changes may include exercising more and losing weight. Follow these instructions at home: Medicines   Take over-the-counter and prescription medicines only as told by your health care provider.  Do not take aspirin to relieve pain if gout is suspected. Activity   Rest your joint if told by your health care provider. Rest is important when your disease is active and your joint feels painful, swollen, or stiff.  Avoid activities that make the pain worse. It is important to balance activity with rest.  Exercise your joint  regularly with range-of-motion exercises as told by your health care provider. Try doing low-impact exercise, such as:  Swimming.  Water aerobics.  Biking.  Walking. Joint Care    If your joint is swollen, keep it elevated if told by your health care provider.  If your joint feels stiff in the morning, try taking a warm shower.  If directed, apply heat to the joint. If you have diabetes, do not apply heat without permission from your health care provider.  Put a towel between the joint and the hot pack or heating pad.  Leave the heat on the area for 20-30 minutes.  If directed, apply ice to the joint:  Put ice in a plastic bag.  Place a towel between your skin and the bag.  Leave the ice on for 20 minutes, 2-3 times per day.  Keep all follow-up visits as told by your health care provider. This is important. Contact a health care provider if:  The pain gets worse.  You have a fever. Get help right away if:  You develop severe joint pain, swelling, or redness.  Many joints become painful and swollen.  You develop severe back pain.  You develop severe weakness in your leg.  You cannot control your bladder or bowels. This information is not intended to replace advice given to you by your health care provider. Make sure you discuss any questions you have with your health care provider. Document Released: 01/14/2005 Document Revised: 05/14/2016 Document Reviewed: 03/04/2015 Elsevier Interactive Patient Education    2017 Elsevier Inc.  

## 2017-05-04 ENCOUNTER — Telehealth: Payer: Self-pay | Admitting: Family

## 2017-05-04 LAB — CMP14+EGFR
A/G RATIO: 2.5 — AB (ref 1.2–2.2)
ALT: 17 IU/L (ref 0–32)
AST: 16 IU/L (ref 0–40)
Albumin: 4.3 g/dL (ref 3.6–4.8)
Alkaline Phosphatase: 73 IU/L (ref 39–117)
BUN/Creatinine Ratio: 19 (ref 12–28)
BUN: 17 mg/dL (ref 8–27)
CALCIUM: 9.3 mg/dL (ref 8.7–10.3)
CHLORIDE: 101 mmol/L (ref 96–106)
CO2: 25 mmol/L (ref 18–29)
Creatinine, Ser: 0.89 mg/dL (ref 0.57–1.00)
GFR calc Af Amer: 80 mL/min/{1.73_m2} (ref >59)
GFR calc non Af Amer: 70 mL/min/{1.73_m2} (ref >59)
Globulin, Total: 1.7 g/dL (ref 1.5–4.5)
Glucose: 123 mg/dL — ABNORMAL HIGH (ref 65–99)
Potassium: 4.6 mmol/L (ref 3.5–5.2)
Sodium: 140 mmol/L (ref 134–144)
Total Protein: 6 g/dL (ref 6.0–8.5)

## 2017-05-04 LAB — LIPID PANEL
CHOLESTEROL TOTAL: 147 mg/dL (ref 100–199)
Chol/HDL Ratio: 2.3 ratio (ref 0.0–4.4)
HDL: 65 mg/dL (ref >39)
LDL Calculated: 62 mg/dL (ref 0–99)
TRIGLYCERIDES: 102 mg/dL (ref 0–149)
VLDL Cholesterol Cal: 20 mg/dL (ref 5–40)

## 2017-05-04 MED ORDER — GABAPENTIN 100 MG PO CAPS: 100.0000 mg | ORAL_CAPSULE | Freq: Three times a day (TID) | ORAL | 3 refills | Status: DC

## 2017-05-04 NOTE — Telephone Encounter (Signed)
Gabapentin Prescription sent to pharmacy, this may make pt sleepy. If so she may only be able to take in evenings.

## 2017-05-04 NOTE — Telephone Encounter (Signed)
Patient is experiencing burning, stinging pain in feet.  Would like to know what can be done.  Please advise and route to pools.

## 2017-05-04 NOTE — Telephone Encounter (Signed)
What symptoms do you have? Diabetic burning of the feet  How long have you been sick? About 2.5 months  Have you been seen for this problem? Was seen yesterday by Alyse Low and mentioned it to her  If your provider decides to give you a prescription, which pharmacy would you like for it to be sent to? Wants to know if she can get some cream, [facility]   Patient informed that this information will be sent to the clinical staff for review and that they should receive a follow up call.

## 2017-05-05 NOTE — Telephone Encounter (Signed)
Aware of new medicine. 

## 2017-05-06 ENCOUNTER — Other Ambulatory Visit: Payer: Medicaid Other | Admitting: Family

## 2017-05-10 ENCOUNTER — Other Ambulatory Visit: Payer: Self-pay | Admitting: Family

## 2017-05-10 DIAGNOSIS — J301 Allergic rhinitis due to pollen: Secondary | ICD-10-CM

## 2017-05-10 DIAGNOSIS — K219 Gastro-esophageal reflux disease without esophagitis: Secondary | ICD-10-CM
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
## 2017-05-13 ENCOUNTER — Encounter (HOSPITAL_COMMUNITY): Payer: Self-pay | Admitting: Psychiatry

## 2017-05-13 ENCOUNTER — Ambulatory Visit (INDEPENDENT_AMBULATORY_CARE_PROVIDER_SITE_OTHER): Payer: Medicaid Other | Admitting: Psychiatry

## 2017-05-13 VITALS — BP 110/57 | HR 83 | Ht 67.0 in | Wt 185.0 lb

## 2017-05-13 DIAGNOSIS — C080 Malignant neoplasm of submandibular gland: Secondary | ICD-10-CM

## 2017-05-13 DIAGNOSIS — F329 Major depressive disorder, single episode, unspecified: Secondary | ICD-10-CM | POA: Diagnosis not present

## 2017-05-13 DIAGNOSIS — M1990 Unspecified osteoarthritis, unspecified site: Secondary | ICD-10-CM | POA: Diagnosis not present

## 2017-05-13 DIAGNOSIS — Z7951 Long term (current) use of inhaled steroids: Secondary | ICD-10-CM

## 2017-05-13 DIAGNOSIS — Z79899 Other long term (current) drug therapy: Secondary | ICD-10-CM

## 2017-05-13 DIAGNOSIS — F331 Major depressive disorder, recurrent, moderate: Secondary | ICD-10-CM

## 2017-05-13 DIAGNOSIS — Z800 Family history of malignant neoplasm of digestive organs: Secondary | ICD-10-CM | POA: Diagnosis not present

## 2017-05-13 DIAGNOSIS — Z833 Family history of diabetes mellitus: Secondary | ICD-10-CM | POA: Diagnosis not present

## 2017-05-13 DIAGNOSIS — Z9109 Other allergy status, other than to drugs and biological substances: Secondary | ICD-10-CM

## 2017-05-13 DIAGNOSIS — Z8249 Family history of ischemic heart disease and other diseases of the circulatory system: Secondary | ICD-10-CM | POA: Diagnosis not present

## 2017-05-13 DIAGNOSIS — F419 Anxiety disorder, unspecified: Secondary | ICD-10-CM

## 2017-05-13 DIAGNOSIS — Z809 Family history of malignant neoplasm, unspecified: Secondary | ICD-10-CM

## 2017-05-13 DIAGNOSIS — Z8349 Family history of other endocrine, nutritional and metabolic diseases: Secondary | ICD-10-CM

## 2017-05-13 DIAGNOSIS — F4310 Post-traumatic stress disorder, unspecified: Secondary | ICD-10-CM | POA: Diagnosis not present

## 2017-05-13 DIAGNOSIS — Z818 Family history of other mental and behavioral disorders: Secondary | ICD-10-CM | POA: Diagnosis not present

## 2017-05-13 DIAGNOSIS — E119 Type 2 diabetes mellitus without complications: Secondary | ICD-10-CM | POA: Diagnosis not present

## 2017-05-13 DIAGNOSIS — Z87891 Personal history of nicotine dependence: Secondary | ICD-10-CM

## 2017-05-13 DIAGNOSIS — Z853 Personal history of malignant neoplasm of breast: Secondary | ICD-10-CM | POA: Diagnosis not present

## 2017-05-13 DIAGNOSIS — Z880 Allergy status to penicillin: Secondary | ICD-10-CM

## 2017-05-13 MED ORDER — GABAPENTIN 300 MG PO CAPS: 300.0000 mg | ORAL_CAPSULE | Freq: Three times a day (TID) | ORAL | 1 refills | Status: DC

## 2017-05-13 NOTE — Patient Instructions (Addendum)
1. Continue fluoxetine 40 mg daily 2. Continue Wellbutrin 300 mg daily 3. Continue Trazodone 150 mg-200 mg  at night as needed for sleep 4. Discontinue Abilify, Xanax 5. Continue Ativan 1 mg twice a day as needed for anxiety 6  Take gabapentin 100 mg three times a day for one day, then 200 mg three times a day for one day, then 300 mg three times a day 7. Return to clinic in one month for 30 mins

## 2017-05-18 ENCOUNTER — Other Ambulatory Visit: Payer: Self-pay | Admitting: Family

## 2017-05-19 NOTE — Telephone Encounter (Signed)
Please forward to [facility]

## 2017-05-20 NOTE — Telephone Encounter (Signed)
Pt is followed by [facility]

## 2017-05-21 ENCOUNTER — Ambulatory Visit (HOSPITAL_COMMUNITY): Payer: Self-pay | Admitting: Psychiatry

## 2017-05-24 ENCOUNTER — Other Ambulatory Visit (HOSPITAL_COMMUNITY): Payer: Self-pay | Admitting: *Deleted

## 2017-05-26 ENCOUNTER — Telehealth (HOSPITAL_COMMUNITY): Payer: Self-pay | Admitting: *Deleted

## 2017-05-26 ENCOUNTER — Ambulatory Visit: Payer: Medicaid Other | Admitting: Gastroenterology

## 2017-05-26 NOTE — Telephone Encounter (Signed)
Noted.  appt was cancelled.

## 2017-05-26 NOTE — Telephone Encounter (Signed)
Pt pharmacy [facility] is requesting refills for pt Lorazepam 1 mg BID PRN. I Pt was last seen on 05-13-2017 and is scheduled to f/u on 06-10-2017. Pt medication was last filled on 04-20-2017 with 60 tabs 0 refill. Pt pharmacy number is [phone].

## 2017-05-26 NOTE — Telephone Encounter (Signed)
Discussed with patient. She states that she does not need refill for ativan. She also states that she lost her insurance and she needs to cancel the next appointment. She feels better on current medication compared to the recent years and [facility]. She is advised to taper down wellbutrin to avoid withdrawal if she is not able to get refill due to financial strain.   - Please cancel the appointment on 6/21.

## 2017-05-26 NOTE — Telephone Encounter (Signed)
Pt pharmacy [facility] is requesting refills for pt [name] 1 mg BID PRN. I Pt was last seen on 05-13-2017 and is scheduled to f/u on 06-10-2017. Pt medication was last filled on 04-20-2017 with 30 tab 1 refill. Pt pharmacy number is [phone].

## 2017-06-09 ENCOUNTER — Other Ambulatory Visit: Payer: Self-pay | Admitting: Family

## 2017-06-09 DIAGNOSIS — N3946 Mixed incontinence: Secondary | ICD-10-CM

## 2017-06-10 ENCOUNTER — Ambulatory Visit (HOSPITAL_COMMUNITY): Payer: Self-pay | Admitting: Psychiatry

## 2017-08-04 ENCOUNTER — Telehealth: Payer: Self-pay | Admitting: Family

## 2017-08-04 DIAGNOSIS — E119 Type 2 diabetes mellitus without complications: Secondary | ICD-10-CM

## 2017-08-04 DIAGNOSIS — F411 Generalized anxiety disorder: Secondary | ICD-10-CM

## 2017-08-04 DIAGNOSIS — J301 Allergic rhinitis due to pollen: Secondary | ICD-10-CM

## 2017-08-04 DIAGNOSIS — K219 Gastro-esophageal reflux disease without esophagitis: Secondary | ICD-10-CM

## 2017-08-04 NOTE — Telephone Encounter (Signed)
Patient has no insurance.  She no longer goes to mental health for lack of money.  She has stopped a lot of her medications.  She currently takes prozac,claritin,metformin,omeprazole,trazodone  And had Xanax filled on 04-28-17.   She is hoping you will help her by giving her refills.   Please send to [facility].

## 2017-08-05 MED ORDER — TRAZODONE HCL 150 MG PO TABS: 150.0000 mg | ORAL_TABLET | Freq: Every day | ORAL | 1 refills | Status: DC

## 2017-08-05 MED ORDER — OMEPRAZOLE 40 MG PO CPDR: DELAYED_RELEASE_CAPSULE | ORAL | 1 refills | Status: DC

## 2017-08-05 MED ORDER — FLUOXETINE HCL 40 MG PO CAPS: 40.0000 mg | ORAL_CAPSULE | Freq: Every day | ORAL | 1 refills | Status: DC

## 2017-08-05 MED ORDER — LORATADINE 10 MG PO TABS: 10.0000 mg | ORAL_TABLET | Freq: Every day | ORAL | 1 refills | Status: DC

## 2017-08-05 MED ORDER — METFORMIN HCL 1000 MG PO TABS: 1000.0000 mg | ORAL_TABLET | Freq: Two times a day (BID) | ORAL | 0 refills | Status: DC

## 2017-08-05 NOTE — Telephone Encounter (Signed)
Prescription sent to pharmacy.

## 2017-08-05 NOTE — Telephone Encounter (Signed)
What medications does pt need refilled? Just those mentioned or the ones that [facility] was filling.

## 2017-08-05 NOTE — Telephone Encounter (Signed)
She ask for only the ones listed.  She says she can manage with those.

## 2017-08-05 NOTE — Addendum Note (Signed)
Addended by: Evelina Dun A on: 08/05/2017 04:54 PM   Modules accepted: Orders

## 2017-10-01 ENCOUNTER — Other Ambulatory Visit: Payer: Self-pay | Admitting: Family

## 2017-12-28 ENCOUNTER — Encounter: Payer: Self-pay | Admitting: Family

## 2017-12-28 ENCOUNTER — Ambulatory Visit: Payer: BLUE CROSS/BLUE SHIELD | Admitting: Family

## 2017-12-28 VITALS — BP 115/76 | HR 89 | Temp 98.7°F | Ht 67.0 in | Wt 204.2 lb

## 2017-12-28 DIAGNOSIS — B3731 Acute candidiasis of vulva and vagina: Secondary | ICD-10-CM

## 2017-12-28 DIAGNOSIS — J449 Chronic obstructive pulmonary disease, unspecified: Secondary | ICD-10-CM

## 2017-12-28 DIAGNOSIS — E119 Type 2 diabetes mellitus without complications: Secondary | ICD-10-CM

## 2017-12-28 DIAGNOSIS — K5900 Constipation, unspecified: Secondary | ICD-10-CM

## 2017-12-28 DIAGNOSIS — N3946 Mixed incontinence: Secondary | ICD-10-CM | POA: Diagnosis not present

## 2017-12-28 DIAGNOSIS — M159 Polyosteoarthritis, unspecified: Secondary | ICD-10-CM

## 2017-12-28 DIAGNOSIS — F331 Major depressive disorder, recurrent, moderate: Secondary | ICD-10-CM | POA: Diagnosis not present

## 2017-12-28 DIAGNOSIS — E1169 Type 2 diabetes mellitus with other specified complication: Secondary | ICD-10-CM | POA: Diagnosis not present

## 2017-12-28 DIAGNOSIS — K219 Gastro-esophageal reflux disease without esophagitis: Secondary | ICD-10-CM | POA: Diagnosis not present

## 2017-12-28 DIAGNOSIS — Z1159 Encounter for screening for other viral diseases: Secondary | ICD-10-CM

## 2017-12-28 DIAGNOSIS — R682 Dry mouth, unspecified: Secondary | ICD-10-CM | POA: Diagnosis not present

## 2017-12-28 DIAGNOSIS — B373 Candidiasis of vulva and vagina: Secondary | ICD-10-CM

## 2017-12-28 DIAGNOSIS — E785 Hyperlipidemia, unspecified: Secondary | ICD-10-CM

## 2017-12-28 DIAGNOSIS — Z23 Encounter for immunization: Secondary | ICD-10-CM | POA: Diagnosis not present

## 2017-12-28 DIAGNOSIS — F411 Generalized anxiety disorder: Secondary | ICD-10-CM | POA: Diagnosis not present

## 2017-12-28 DIAGNOSIS — G4700 Insomnia, unspecified: Secondary | ICD-10-CM

## 2017-12-28 LAB — BAYER DCA HB A1C WAIVED: HB A1C: 6.4 % (ref <7.0)

## 2017-12-28 MED ORDER — BUPROPION HCL ER (XL) 150 MG PO TB24: 150.0000 mg | ORAL_TABLET | Freq: Every day | ORAL | 1 refills | Status: DC

## 2017-12-28 MED ORDER — MELOXICAM 15 MG PO TABS: 15.0000 mg | ORAL_TABLET | Freq: Every day | ORAL | 0 refills | Status: DC

## 2017-12-28 MED ORDER — OMEPRAZOLE 40 MG PO CPDR: DELAYED_RELEASE_CAPSULE | ORAL | 1 refills | Status: DC

## 2017-12-28 MED ORDER — METFORMIN HCL 1000 MG PO TABS: 1000.0000 mg | ORAL_TABLET | Freq: Two times a day (BID) | ORAL | 0 refills | Status: DC

## 2017-12-28 MED ORDER — ALPRAZOLAM 0.5 MG PO TABS: 0.5000 mg | ORAL_TABLET | Freq: Two times a day (BID) | ORAL | 3 refills | Status: DC | PRN

## 2017-12-28 MED ORDER — FLUCONAZOLE 150 MG PO TABS: 150.0000 mg | ORAL_TABLET | ORAL | 0 refills | Status: DC | PRN

## 2017-12-28 MED ORDER — MIRABEGRON ER 50 MG PO TB24: 50.0000 mg | ORAL_TABLET | Freq: Every day | ORAL | 0 refills | Status: DC

## 2017-12-28 MED ORDER — PILOCARPINE HCL 5 MG PO TABS: 5.0000 mg | ORAL_TABLET | Freq: Three times a day (TID) | ORAL | 0 refills | Status: DC

## 2017-12-28 MED ORDER — FLUOXETINE HCL 40 MG PO CAPS: 40.0000 mg | ORAL_CAPSULE | Freq: Every day | ORAL | 1 refills | Status: DC

## 2017-12-28 MED ORDER — ASPIRIN EC 81 MG PO TBEC: 81.0000 mg | DELAYED_RELEASE_TABLET | Freq: Every day | ORAL | 1 refills | Status: DC

## 2017-12-28 MED ORDER — FLUTICASONE FUROATE-VILANTEROL 200-25 MCG/INH IN AEPB: 1.0000 | INHALATION_SPRAY | Freq: Every day | RESPIRATORY_TRACT | 5 refills | Status: DC

## 2017-12-28 MED ORDER — PRAVASTATIN SODIUM 40 MG PO TABS: 40.0000 mg | ORAL_TABLET | Freq: Every day | ORAL | 2 refills | Status: DC

## 2017-12-28 MED ORDER — TRAZODONE HCL 100 MG PO TABS: 200.0000 mg | ORAL_TABLET | Freq: Every day | ORAL | 1 refills | Status: DC

## 2017-12-28 NOTE — Progress Notes (Signed)
Subjective:    Patient ID: [name], female    DOB: 1956/03/24, 62 y.o.   MRN: [id number]  PT presents to the office today for chronic follow up. Pt history of Left submandibular cancer and is followed by them annually . Pt was seeing  [facility] every month for depression, Insomnia, PTSD, and GAD, but lost her insurance.  Pt is followed by GI for dysphagia and colonic polyps and follow up with them every 6 months.  Diabetes  She presents for her follow-up diabetic visit. She has type 2 diabetes mellitus. Her disease course has been stable. Hypoglycemia symptoms include nervousness/anxiousness. Associated symptoms include blurred vision and foot paresthesias. Pertinent negatives for diabetes include no visual change. There are no hypoglycemic complications. Symptoms are worsening. Diabetic complications include peripheral neuropathy. Pertinent negatives for diabetic complications include no CVA, heart disease or nephropathy. She is following a generally unhealthy diet. (Does not check BS at home ) Eye exam is not current.  Hyperlipidemia  This is a chronic problem. The current episode started more than 1 year ago. The problem is controlled. Recent lipid tests were reviewed and are normal. Exacerbating diseases include obesity. Current antihyperlipidemic treatment includes statins. The current treatment provides moderate improvement of lipids. Risk factors for coronary artery disease include diabetes mellitus, dyslipidemia, hypertension and a sedentary lifestyle.  Gastroesophageal Reflux  She complains of belching and heartburn. She reports no coughing. This is a chronic problem. The current episode started more than 1 year ago. The problem occurs frequently. The problem has been waxing and waning. She has tried a PPI and an antacid for the symptoms. The treatment provided mild relief.  Constipation  This is a chronic problem. The current episode started more than 1 year ago. The problem  has been waxing and waning since onset. Her stool frequency is 1 time per day. She has tried diet changes for the symptoms. The treatment provided moderate relief.  Depression         This is a chronic problem.  The current episode started more than 1 year ago.   The onset quality is gradual.   The problem occurs constantly.  The problem has been rapidly worsening since onset.  Associated symptoms include decreased concentration, helplessness, hopelessness, insomnia, irritable, restlessness, decreased interest and sad.  Past medical history includes anxiety.   Anxiety  Presents for follow-up visit. Symptoms include decreased concentration, depressed mood, excessive worry, insomnia, irritability, nervous/anxious behavior and restlessness. Symptoms occur most days. The severity of symptoms is moderate.    Insomnia  Primary symptoms: difficulty falling asleep, frequent awakening.  The current episode started more than one year. The onset quality is gradual. PMH includes: depression.  Vaginal Itching  The patient's primary symptoms include genital itching and a genital odor. This is a new problem. The current episode started 1 to 4 weeks ago. The problem occurs intermittently. The problem has been waxing and waning. Associated symptoms include constipation.  COPD Pt currently not smoking. States she has used the fireplace this winter and her breathing is better. Pt states she has only been taking advair as needed. Urge and Stress incontinence  PT was taking myrtbriq that was helping, but states after she lost her insurance she could not afford this. States she is having incontinence.     Review of Systems  Constitutional: Positive for irritability.  Eyes: Positive for blurred vision.  Respiratory: Negative for cough.   Gastrointestinal: Positive for constipation and heartburn.  Psychiatric/Behavioral: Positive for decreased concentration  and depression. The patient is nervous/anxious and has  insomnia.   All other systems reviewed and are negative.      Objective:   Physical Exam  Constitutional: She is oriented to person, place, and time. She appears well-developed and well-nourished. She is irritable. No distress.  HENT:  Head: Normocephalic.  Right Ear: External ear normal.  Left Ear: External ear normal.  Nose: Nose normal.  Mouth/Throat: Oropharynx is clear and moist.  Eyes: Pupils are equal, round, and reactive to light.  Neck: Normal range of motion. Neck supple. No thyromegaly present.  Cardiovascular: Normal rate, regular rhythm, normal heart sounds and intact distal pulses.  No murmur heard. Pulmonary/Chest: Effort normal and breath sounds normal. No respiratory distress. She has no wheezes.  Abdominal: Soft. Bowel sounds are normal. She exhibits no distension. There is no tenderness.  Musculoskeletal: Normal range of motion. She exhibits no edema or tenderness.  Neurological: She is alert and oriented to person, place, and time.  Skin: Skin is warm and dry.  Psychiatric: She has a normal mood and affect. Her behavior is normal. Judgment and thought content normal.  Vitals reviewed.   Diabetic Foot Exam - Simple   Simple Foot Form Diabetic Foot exam was performed with the following findings:  Yes 12/28/2017  3:39 PM  Visual Inspection No deformities, no ulcerations, no other skin breakdown bilaterally:  Yes Sensation Testing Intact to touch and monofilament testing bilaterally:  Yes Pulse Check Posterior Tibialis and Dorsalis pulse intact bilaterally:  Yes Comments      BP 115/76   Pulse 89   Temp 98.7 F (37.1 C) (Oral)   Ht '5\' 7"'  (1.702 m)   Wt 204 lb 3.2 oz (92.6 kg)   BMI 31.98 kg/m      Assessment & Plan:  1. Chronic obstructive pulmonary disease, unspecified COPD type ([place]) Will order Breo and stop Advair and Spiriva  - fluticasone furoate-vilanterol (BREO ELLIPTA) 200-25 MCG/INH AEPB; Inhale 1 puff into the lungs daily.  Dispense: 1  each; Refill: 5 - CMP14+EGFR  2. Type 2 diabetes mellitus without complication, without long-term current use of insulin (HCC) - metFORMIN (GLUCOPHAGE) 1000 MG tablet; Take 1 tablet (1,000 mg total) by mouth 2 (two) times daily.  Dispense: 180 tablet; Refill: 0 - CMP14+EGFR - Microalbumin / creatinine urine ratio - Bayer DCA Hb A1c Waived - Ambulatory referral to Ophthalmology  3. Hyperlipidemia associated with type 2 diabetes mellitus (HCC) - pravastatin (PRAVACHOL) 40 MG tablet; Take 1 tablet (40 mg total) by mouth daily.  Dispense: 90 tablet; Refill: 2 - CMP14+EGFR - Lipid panel  4. Constipation, unspecified constipation type - CMP14+EGFR  5. Major depressive disorder, recurrent episode, moderate (HCC) Will restart Wellbutrin 150 mg - FLUoxetine (PROZAC) 40 MG capsule; Take 1 capsule (40 mg total) by mouth daily.  Dispense: 90 capsule; Refill: 1 - CMP14+EGFR - Ambulatory referral to Psychiatry - buPROPion (WELLBUTRIN XL) 150 MG 24 hr tablet; Take 1 tablet (150 mg total) by mouth daily.  Dispense: 90 tablet; Refill: 1  6. Gastroesophageal reflux disease, esophagitis presence not specified - omeprazole (PRILOSEC) 40 MG capsule; TAKE 1 TABLET BY MOUTH EVERY EVENING  Dispense: 90 capsule; Refill: 1 - CMP14+EGFR  7. Insomnia, unspecified type Will increase trazodone 200 mg - traZODone (DESYREL) 100 MG tablet; Take 2 tablets (200 mg total) by mouth at bedtime.  Dispense: 180 tablet; Refill: 1 - CMP14+EGFR  8. GAD (generalized anxiety disorder) - FLUoxetine (PROZAC) 40 MG capsule; Take 1 capsule (40 mg  total) by mouth daily.  Dispense: 90 capsule; Refill: 1 - CMP14+EGFR - Ambulatory referral to Psychiatry - buPROPion (WELLBUTRIN XL) 150 MG 24 hr tablet; Take 1 tablet (150 mg total) by mouth daily.  Dispense: 90 tablet; Refill: 1 - ALPRAZolam (XANAX) 0.5 MG tablet; Take 1 tablet (0.5 mg total) by mouth 2 (two) times daily as needed for anxiety.  Dispense: 60 tablet; Refill: 3  9.  Mixed incontinence urge and stress - mirabegron ER ([name]) 50 MG TB24 tablet; Take 1 tablet (50 mg total) by mouth daily.  Dispense: 90 tablet; Refill: 0 - CMP14+EGFR  10. Dry mouth - pilocarpine (SALAGEN) 5 MG tablet; Take 1 tablet (5 mg total) by mouth 3 (three) times daily.  Dispense: 90 tablet; Refill: 0 - CMP14+EGFR  11. Need for hepatitis C screening test - Hepatitis C antibody  12. Vagina, candidiasis Keep clean and dry - fluconazole (DIFLUCAN) 150 MG tablet; Take 1 tablet (150 mg total) by mouth every three (3) days as needed.  Dispense: 3 tablet; Refill: 0  13. Osteoarthritis of multiple joints, unspecified osteoarthritis type - meloxicam (MOBIC) 15 MG tablet; Take 1 tablet (15 mg total) by mouth daily.  Dispense: 30 tablet; Refill: 0   Continue all meds Labs pending Health Maintenance reviewed Diet and exercise encouraged RTO 6 weeks   Evelina Dun, FNP

## 2017-12-28 NOTE — Patient Instructions (Signed)

## 2017-12-29 LAB — CMP14+EGFR
A/G RATIO: 1.6 (ref 1.2–2.2)
ALK PHOS: 72 IU/L (ref 39–117)
ALT: 17 IU/L (ref 0–32)
AST: 19 IU/L (ref 0–40)
Albumin: 4.5 g/dL (ref 3.6–4.8)
BUN/Creatinine Ratio: 16 (ref 12–28)
BUN: 9 mg/dL (ref 8–27)
Bilirubin Total: 0.4 mg/dL (ref 0.0–1.2)
CO2: 24 mmol/L (ref 20–29)
Calcium: 9.9 mg/dL (ref 8.7–10.3)
Chloride: 103 mmol/L (ref 96–106)
Creatinine, Ser: 0.58 mg/dL (ref 0.57–1.00)
GFR calc Af Amer: 114 mL/min/{1.73_m2} (ref >59)
GFR calc non Af Amer: 99 mL/min/{1.73_m2} (ref >59)
GLUCOSE: 91 mg/dL (ref 65–99)
Globulin, Total: 2.9 g/dL (ref 1.5–4.5)
POTASSIUM: 3.5 mmol/L (ref 3.5–5.2)
Sodium: 144 mmol/L (ref 134–144)
Total Protein: 7.4 g/dL (ref 6.0–8.5)

## 2017-12-29 LAB — LIPID PANEL
CHOLESTEROL TOTAL: 225 mg/dL — AB (ref 100–199)
Chol/HDL Ratio: 3.4 ratio (ref 0.0–4.4)
HDL: 67 mg/dL (ref >39)
LDL Calculated: 140 mg/dL — ABNORMAL HIGH (ref 0–99)
Triglycerides: 88 mg/dL (ref 0–149)
VLDL CHOLESTEROL CAL: 18 mg/dL (ref 5–40)

## 2017-12-29 LAB — MICROALBUMIN / CREATININE URINE RATIO
CREATININE, UR: 50.6 mg/dL
Microalb/Creat Ratio: <5.9 mg/g creat (ref 0.0–30.0)
Microalbumin, Urine: <3 ug/mL

## 2017-12-29 LAB — HEPATITIS C ANTIBODY: Hep C Virus Ab: <0.1 s/co ratio (ref 0.0–0.9)

## 2018-01-03 ENCOUNTER — Telehealth: Payer: Self-pay

## 2018-01-03 NOTE — Telephone Encounter (Signed)
Insurance denied [name]  Must try tow of the alternatives Darifenac ER, Tolterodine, Tolterodine ER Trospium and Trospium ER

## 2018-01-04 NOTE — Telephone Encounter (Signed)
Pt has tried multiple other medications but could not tolerate. Can try to get approved? Thanks

## 2018-01-05 LAB — HM DIABETES EYE EXAM

## 2018-01-11 ENCOUNTER — Telehealth: Payer: Self-pay | Admitting: Family

## 2018-01-11 NOTE — Telephone Encounter (Signed)
Debbie could we try to get this approved? Pt has tried multiple over medications but could not tolerate.

## 2018-01-18 ENCOUNTER — Telehealth: Payer: Self-pay

## 2018-01-18 NOTE — Telephone Encounter (Signed)
Insurance approved appeal for [employer]

## 2018-03-08 ENCOUNTER — Other Ambulatory Visit: Payer: Self-pay | Admitting: Family

## 2018-03-08 DIAGNOSIS — M159 Polyosteoarthritis, unspecified: Secondary | ICD-10-CM

## 2018-03-30 ENCOUNTER — Other Ambulatory Visit: Payer: Self-pay | Admitting: Family

## 2018-03-30 DIAGNOSIS — E119 Type 2 diabetes mellitus without complications: Secondary | ICD-10-CM

## 2018-04-19 ENCOUNTER — Other Ambulatory Visit: Payer: Self-pay | Admitting: Family

## 2018-04-19 DIAGNOSIS — M159 Polyosteoarthritis, unspecified: Secondary | ICD-10-CM

## 2018-04-19 NOTE — Telephone Encounter (Signed)
Last seen 12/28/17

## 2018-04-24 ENCOUNTER — Other Ambulatory Visit: Payer: Self-pay | Admitting: Family

## 2018-04-24 DIAGNOSIS — F411 Generalized anxiety disorder: Secondary | ICD-10-CM

## 2018-05-26 ENCOUNTER — Other Ambulatory Visit: Payer: Self-pay | Admitting: Family

## 2018-05-26 DIAGNOSIS — M159 Polyosteoarthritis, unspecified: Secondary | ICD-10-CM

## 2018-06-15 ENCOUNTER — Other Ambulatory Visit: Payer: Self-pay | Admitting: Family

## 2018-06-15 DIAGNOSIS — Z1231 Encounter for screening mammogram for malignant neoplasm of breast: Secondary | ICD-10-CM

## 2018-06-15 DIAGNOSIS — M159 Polyosteoarthritis, unspecified: Secondary | ICD-10-CM

## 2018-06-15 NOTE — Telephone Encounter (Signed)
Last seen 12/29/27

## 2018-06-18 ENCOUNTER — Other Ambulatory Visit: Payer: Self-pay | Admitting: Family

## 2018-06-18 DIAGNOSIS — F411 Generalized anxiety disorder: Secondary | ICD-10-CM

## 2018-06-18 DIAGNOSIS — G4700 Insomnia, unspecified: Secondary | ICD-10-CM

## 2018-06-18 DIAGNOSIS — F331 Major depressive disorder, recurrent, moderate: Secondary | ICD-10-CM

## 2018-06-20 ENCOUNTER — Encounter (HOSPITAL_COMMUNITY): Payer: Self-pay

## 2018-06-20 ENCOUNTER — Ambulatory Visit (HOSPITAL_COMMUNITY)
Admission: RE | Admit: 2018-06-20 | Discharge: 2018-06-20 | Disposition: A | Discharge status: Home / Self Care | Payer: Medicaid Other | Source: Ambulatory Visit | Attending: Family | Admitting: Family

## 2018-06-20 DIAGNOSIS — Z1231 Encounter for screening mammogram for malignant neoplasm of breast: Secondary | ICD-10-CM

## 2018-06-27 ENCOUNTER — Other Ambulatory Visit: Payer: Self-pay | Admitting: Family

## 2018-06-27 DIAGNOSIS — F331 Major depressive disorder, recurrent, moderate: Secondary | ICD-10-CM

## 2018-06-27 DIAGNOSIS — M159 Polyosteoarthritis, unspecified: Secondary | ICD-10-CM

## 2018-06-27 DIAGNOSIS — F411 Generalized anxiety disorder: Secondary | ICD-10-CM

## 2018-07-03 ENCOUNTER — Other Ambulatory Visit: Payer: Self-pay | Admitting: Family

## 2018-07-03 DIAGNOSIS — J301 Allergic rhinitis due to pollen: Secondary | ICD-10-CM

## 2018-08-15 ENCOUNTER — Other Ambulatory Visit: Payer: Self-pay | Admitting: Family

## 2018-08-15 DIAGNOSIS — F411 Generalized anxiety disorder: Secondary | ICD-10-CM

## 2018-08-15 NOTE — Telephone Encounter (Signed)
Last seen 12/28/17

## 2018-08-30 ENCOUNTER — Other Ambulatory Visit: Payer: Self-pay | Admitting: Family

## 2018-08-30 DIAGNOSIS — F331 Major depressive disorder, recurrent, moderate: Secondary | ICD-10-CM

## 2018-08-30 DIAGNOSIS — F411 Generalized anxiety disorder: Secondary | ICD-10-CM

## 2018-08-30 DIAGNOSIS — K219 Gastro-esophageal reflux disease without esophagitis: Secondary | ICD-10-CM

## 2018-08-30 DIAGNOSIS — M159 Polyosteoarthritis, unspecified: Secondary | ICD-10-CM

## 2018-08-30 DIAGNOSIS — G4700 Insomnia, unspecified: Secondary | ICD-10-CM

## 2018-08-30 DIAGNOSIS — E785 Hyperlipidemia, unspecified: Secondary | ICD-10-CM

## 2018-08-30 DIAGNOSIS — E1169 Type 2 diabetes mellitus with other specified complication: Secondary | ICD-10-CM

## 2018-08-31 NOTE — Telephone Encounter (Signed)
Last seen and last lipid 12/28/17

## 2018-09-01 NOTE — Telephone Encounter (Signed)
Patient NTBS for follow up and lab work  

## 2018-09-02 NOTE — Telephone Encounter (Signed)
Left message for patient to call  to schedule a follow up and lab work.

## 2018-09-05 ENCOUNTER — Ambulatory Visit: Payer: BLUE CROSS/BLUE SHIELD | Admitting: Family

## 2018-09-05 ENCOUNTER — Encounter: Payer: Self-pay | Admitting: Family

## 2018-09-05 VITALS — BP 102/65 | HR 86 | Temp 98.3°F | Ht 67.0 in | Wt 214.0 lb

## 2018-09-05 DIAGNOSIS — K219 Gastro-esophageal reflux disease without esophagitis: Secondary | ICD-10-CM

## 2018-09-05 DIAGNOSIS — J449 Chronic obstructive pulmonary disease, unspecified: Secondary | ICD-10-CM

## 2018-09-05 DIAGNOSIS — E119 Type 2 diabetes mellitus without complications: Secondary | ICD-10-CM | POA: Diagnosis not present

## 2018-09-05 DIAGNOSIS — K5900 Constipation, unspecified: Secondary | ICD-10-CM

## 2018-09-05 DIAGNOSIS — M159 Polyosteoarthritis, unspecified: Secondary | ICD-10-CM

## 2018-09-05 DIAGNOSIS — F331 Major depressive disorder, recurrent, moderate: Secondary | ICD-10-CM

## 2018-09-05 DIAGNOSIS — F411 Generalized anxiety disorder: Secondary | ICD-10-CM

## 2018-09-05 DIAGNOSIS — G4700 Insomnia, unspecified: Secondary | ICD-10-CM

## 2018-09-05 DIAGNOSIS — F4310 Post-traumatic stress disorder, unspecified: Secondary | ICD-10-CM | POA: Diagnosis not present

## 2018-09-05 DIAGNOSIS — F1120 Opioid dependence, uncomplicated: Secondary | ICD-10-CM

## 2018-09-05 DIAGNOSIS — E1169 Type 2 diabetes mellitus with other specified complication: Secondary | ICD-10-CM

## 2018-09-05 DIAGNOSIS — M1991 Primary osteoarthritis, unspecified site: Secondary | ICD-10-CM

## 2018-09-05 DIAGNOSIS — Z79899 Other long term (current) drug therapy: Secondary | ICD-10-CM

## 2018-09-05 DIAGNOSIS — E785 Hyperlipidemia, unspecified: Secondary | ICD-10-CM

## 2018-09-05 DIAGNOSIS — N3946 Mixed incontinence: Secondary | ICD-10-CM

## 2018-09-05 DIAGNOSIS — C080 Malignant neoplasm of submandibular gland: Secondary | ICD-10-CM

## 2018-09-05 LAB — BAYER DCA HB A1C WAIVED: HB A1C (BAYER DCA - WAIVED): 7.1 % — ABNORMAL HIGH (ref <7.0)

## 2018-09-05 MED ORDER — TRAZODONE HCL 100 MG PO TABS: 200.0000 mg | ORAL_TABLET | Freq: Every day | ORAL | 2 refills | Status: DC

## 2018-09-05 MED ORDER — FLUTICASONE FUROATE-VILANTEROL 200-25 MCG/INH IN AEPB: 1.0000 | INHALATION_SPRAY | Freq: Every day | RESPIRATORY_TRACT | 5 refills | Status: DC

## 2018-09-05 MED ORDER — FLUOXETINE HCL 40 MG PO CAPS: ORAL_CAPSULE | ORAL | 1 refills | Status: DC

## 2018-09-05 MED ORDER — ALPRAZOLAM 0.5 MG PO TABS
ORAL_TABLET | ORAL | 2 refills | Status: DC
Start: 2018-09-05 — End: 2018-12-12

## 2018-09-05 MED ORDER — BUPROPION HCL ER (XL) 150 MG PO TB24: 150.0000 mg | ORAL_TABLET | Freq: Every day | ORAL | 2 refills | Status: DC

## 2018-09-05 MED ORDER — MELOXICAM 15 MG PO TABS: 15.0000 mg | ORAL_TABLET | Freq: Every day | ORAL | 3 refills | Status: DC

## 2018-09-05 MED ORDER — METFORMIN HCL 1000 MG PO TABS: 1000.0000 mg | ORAL_TABLET | Freq: Two times a day (BID) | ORAL | 2 refills | Status: DC

## 2018-09-05 MED ORDER — OMEPRAZOLE 40 MG PO CPDR: DELAYED_RELEASE_CAPSULE | ORAL | 0 refills | Status: DC

## 2018-09-05 MED ORDER — PRAVASTATIN SODIUM 40 MG PO TABS: 40.0000 mg | ORAL_TABLET | Freq: Every day | ORAL | 1 refills | Status: DC

## 2018-09-05 MED ORDER — MIRABEGRON ER 50 MG PO TB24: 50.0000 mg | ORAL_TABLET | Freq: Every day | ORAL | 0 refills | Status: DC

## 2018-09-05 NOTE — Progress Notes (Signed)
Subjective:    Patient ID: [name], female    DOB: [birth year], 62 y.o.   MRN: [id number]  Chief Complaint  Patient presents with  . Medical Management of Chronic Issues    follow up   PT presents to the office today for chronic follow up.PT lost her insurance last year and states she has not followed up with her specialists like she should. She has not taken her metformin.   Pt history of Left submandibular cancerand Oncologists and was followed by them annually.PT had been followed by Urologists for urinary incontinence.   Pt is followed by GI for dysphagia and colonic polyps. She states she is going to make follow up appt with all these providers.  Diabetes  She presents for her follow-up diabetic visit. She has type 2 diabetes mellitus. Her disease course has been stable. Hypoglycemia symptoms include nervousness/anxiousness. Associated symptoms include fatigue. Pertinent negatives for diabetes include no blurred vision, no chest pain, no foot paresthesias and no visual change. There are no hypoglycemic complications. Symptoms are stable. Pertinent negatives for diabetic complications include no CVA, heart disease, nephropathy or peripheral neuropathy. Risk factors for coronary artery disease include diabetes mellitus, dyslipidemia, hypertension, obesity and sedentary lifestyle. She is following a generally unhealthy diet. (Does not check BS) Eye exam is current.  Gastroesophageal Reflux  She complains of heartburn. She reports no chest pain or no coughing. This is a chronic problem. The current episode started more than 1 year ago. The problem occurs occasionally. The problem has been waxing and waning. The symptoms are aggravated by certain foods and stress. Associated symptoms include fatigue. Risk factors include obesity. She has tried a PPI for the symptoms. The treatment provided moderate relief.  Anxiety  Presents for follow-up visit. Symptoms include decreased  concentration, depressed mood, excessive worry, irritability, nervous/anxious behavior, palpitations, panic and restlessness. Patient reports no chest pain. Symptoms occur occasionally. The severity of symptoms is moderate. The quality of sleep is good.    Depression         This is a chronic problem.  The current episode started more than 1 year ago.   The onset quality is gradual.   The problem occurs intermittently.  The problem has been waxing and waning since onset.  Associated symptoms include decreased concentration, fatigue, helplessness, hopelessness, irritable, restlessness, decreased interest and sad.  Past treatments include SSRIs - Selective serotonin reuptake inhibitors.  Compliance with treatment is good.  Past medical history includes anxiety.   Constipation  This is a chronic problem. The current episode started more than 1 year ago. The problem has been waxing and waning since onset. She has tried laxatives for the symptoms. The treatment provided mild relief.  Hyperlipidemia  This is a chronic problem. The current episode started more than 1 year ago. The problem is uncontrolled. Recent lipid tests were reviewed and are high. Exacerbating diseases include obesity. Pertinent negatives include no chest pain. Current antihyperlipidemic treatment includes diet change. The current treatment provides no improvement of lipids. Risk factors for coronary artery disease include dyslipidemia, diabetes mellitus, hypertension, a sedentary lifestyle and post-menopausal.      Review of Systems  Constitutional: Positive for fatigue and irritability.  Eyes: Negative for blurred vision.  Respiratory: Negative for cough.   Cardiovascular: Positive for palpitations. Negative for chest pain.  Gastrointestinal: Positive for constipation and heartburn.  Psychiatric/Behavioral: Positive for decreased concentration and depression. The patient is nervous/anxious.   All other systems reviewed and are  negative.  Objective:   Physical Exam  Constitutional: She is oriented to person, place, and time. She appears well-developed and well-nourished. She is irritable. No distress.  HENT:  Head: Normocephalic and atraumatic.  Right Ear: External ear normal.  Left Ear: External ear normal.  Mouth/Throat: Oropharynx is clear and moist.  Eyes: Pupils are equal, round, and reactive to light.  Neck: Normal range of motion. Neck supple. No thyromegaly present.  Cardiovascular: Normal rate, regular rhythm, normal heart sounds and intact distal pulses.  No murmur heard. Pulmonary/Chest: Effort normal and breath sounds normal. No respiratory distress. She has no wheezes.  Abdominal: Soft. Bowel sounds are normal. She exhibits no distension. There is no tenderness.  Musculoskeletal: Normal range of motion. She exhibits no edema or tenderness.  Neurological: She is alert and oriented to person, place, and time. She has normal reflexes. No cranial nerve deficit.  Skin: Skin is warm and dry.  Psychiatric: Her behavior is normal. Judgment and thought content normal. Her mood appears anxious.  Vitals reviewed.     BP 102/65   Pulse 86   Temp 98.3 F (36.8 C) (Oral)   Ht 5' 7" (1.702 m)   Wt 214 lb (97.1 kg)   BMI 33.52 kg/m      Assessment & Plan:  VERNETTE [name] comes in today with chief complaint of Medical Management of Chronic Issues (follow up)   Diagnosis and orders addressed:  1. Type 2 diabetes mellitus without complication, without long-term current use of insulin (HCC) - CMP14+EGFR - CBC with Differential/Platelet - metFORMIN (GLUCOPHAGE) 1000 MG tablet; Take 1 tablet (1,000 mg total) by mouth 2 (two) times daily.  Dispense: 180 tablet; Refill: 2 - Bayer DCA Hb A1c Waived  2. Constipation, unspecified constipation type - CMP14+EGFR - CBC with Differential/Platelet  3. Chronic obstructive pulmonary disease, unspecified COPD type ([place]) - CMP14+EGFR - CBC with  Differential/Platelet - fluticasone furoate-vilanterol (BREO ELLIPTA) 200-25 MCG/INH AEPB; Inhale 1 puff into the lungs daily.  Dispense: 1 each; Refill: 5  4. PTSD (post-traumatic stress disorder) - CMP14+EGFR - CBC with Differential/Platelet  5. Primary osteoarthritis, unspecified site - CMP14+EGFR - CBC with Differential/Platelet  6. Major depressive disorder, recurrent episode, moderate (HCC) - CMP14+EGFR - CBC with Differential/Platelet - FLUoxetine (PROZAC) 40 MG capsule; TAKE 1 CAPSULE BY MOUTH EVERY DAY  Dispense: 90 capsule; Refill: 1 - buPROPion (WELLBUTRIN XL) 150 MG 24 hr tablet; Take 1 tablet (150 mg total) by mouth daily.  Dispense: 90 tablet; Refill: 2  7. Insomnia, unspecified type - CMP14+EGFR - CBC with Differential/Platelet - traZODone (DESYREL) 100 MG tablet; Take 2 tablets (200 mg total) by mouth at bedtime.  Dispense: 180 tablet; Refill: 2  8. Hyperlipidemia associated with type 2 diabetes mellitus (HCC) - CMP14+EGFR - CBC with Differential/Platelet - Lipid panel - pravastatin (PRAVACHOL) 40 MG tablet; Take 1 tablet (40 mg total) by mouth daily.  Dispense: 90 tablet; Refill: 1  9. Gastroesophageal reflux disease, esophagitis presence not specified - CMP14+EGFR - CBC with Differential/Platelet - omeprazole (PRILOSEC) 40 MG capsule; TAKE 1 CAPSULE BY MOUTH EVERY EVENING  Dispense: 90 capsule; Refill: 0  10. Mixed incontinence urge and stress  - CMP14+EGFR - CBC with Differential/Platelet - mirabegron ER ([name]) 50 MG TB24 tablet; Take 1 tablet (50 mg total) by mouth daily.  Dispense: 90 tablet; Refill: 0 - Ambulatory referral to Urology  11. Uncomplicated opioid dependence ([place]) - CMP14+EGFR - CBC with Differential/Platelet - ToxASSURE Select 13 (MW), Urine  12. Controlled substance agreement signed -  CMP14+EGFR - CBC with Differential/Platelet - ToxASSURE Select 13 (MW), Urine  13. GAD (generalized anxiety disorder) - CMP14+EGFR - CBC with  Differential/Platelet - ToxASSURE Select 13 (MW), Urine - ALPRAZolam (XANAX) 0.5 MG tablet; TAKE 1 TABLET BY MOUTH TWICE DAILY FOR FOR ANXIETY  Dispense: 60 tablet; Refill: 2 - FLUoxetine (PROZAC) 40 MG capsule; TAKE 1 CAPSULE BY MOUTH EVERY DAY  Dispense: 90 capsule; Refill: 1 - buPROPion (WELLBUTRIN XL) 150 MG 24 hr tablet; Take 1 tablet (150 mg total) by mouth daily.  Dispense: 90 tablet; Refill: 2  14. Osteoarthritis of multiple joints, unspecified osteoarthritis type - CMP14+EGFR - CBC with Differential/Platelet - meloxicam (MOBIC) 15 MG tablet; Take 1 tablet (15 mg total) by mouth daily.  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

## 2018-09-05 NOTE — Patient Instructions (Signed)

## 2018-09-06 LAB — CBC WITH DIFFERENTIAL/PLATELET
Basophils Absolute: 0.1 10*3/uL (ref 0.0–0.2)
Basos: 1 %
EOS (ABSOLUTE): 0.2 10*3/uL (ref 0.0–0.4)
EOS: 4 %
HEMATOCRIT: 37.8 % (ref 34.0–46.6)
Hemoglobin: 12.5 g/dL (ref 11.1–15.9)
IMMATURE GRANULOCYTES: 0 %
Immature Grans (Abs): 0 10*3/uL (ref 0.0–0.1)
Lymphocytes Absolute: 1.4 10*3/uL (ref 0.7–3.1)
Lymphs: 26 %
MCH: 29.6 pg (ref 26.6–33.0)
MCHC: 33.1 g/dL (ref 31.5–35.7)
MCV: 90 fL (ref 79–97)
MONOS ABS: 0.4 10*3/uL (ref 0.1–0.9)
Monocytes: 8 %
NEUTROS PCT: 61 %
Neutrophils Absolute: 3.2 10*3/uL (ref 1.4–7.0)
Platelets: 294 10*3/uL (ref 150–450)
RBC: 4.22 x10E6/uL (ref 3.77–5.28)
RDW: 12.8 % (ref 12.3–15.4)
WBC: 5.2 10*3/uL (ref 3.4–10.8)

## 2018-09-06 LAB — LIPID PANEL
CHOL/HDL RATIO: 3 ratio (ref 0.0–4.4)
Cholesterol, Total: 191 mg/dL (ref 100–199)
HDL: 63 mg/dL (ref >39)
LDL Calculated: 106 mg/dL — ABNORMAL HIGH (ref 0–99)
TRIGLYCERIDES: 110 mg/dL (ref 0–149)
VLDL Cholesterol Cal: 22 mg/dL (ref 5–40)

## 2018-09-06 LAB — CMP14+EGFR
ALBUMIN: 4.4 g/dL (ref 3.6–4.8)
ALK PHOS: 89 IU/L (ref 39–117)
ALT: 14 IU/L (ref 0–32)
AST: 15 IU/L (ref 0–40)
Albumin/Globulin Ratio: 2.2 (ref 1.2–2.2)
BUN / CREAT RATIO: 10 — AB (ref 12–28)
BUN: 10 mg/dL (ref 8–27)
CO2: 26 mmol/L (ref 20–29)
CREATININE: 0.99 mg/dL (ref 0.57–1.00)
Calcium: 9.6 mg/dL (ref 8.7–10.3)
Chloride: 99 mmol/L (ref 96–106)
GFR calc Af Amer: 70 mL/min/{1.73_m2} (ref >59)
GFR calc non Af Amer: 61 mL/min/{1.73_m2} (ref >59)
GLOBULIN, TOTAL: 2 g/dL (ref 1.5–4.5)
Glucose: 208 mg/dL — ABNORMAL HIGH (ref 65–99)
Potassium: 5 mmol/L (ref 3.5–5.2)
SODIUM: 139 mmol/L (ref 134–144)
Total Protein: 6.4 g/dL (ref 6.0–8.5)

## 2018-09-09 LAB — TOXASSURE SELECT 13 (MW), URINE

## 2018-11-27 ENCOUNTER — Other Ambulatory Visit: Payer: Self-pay | Admitting: Family

## 2018-12-05 ENCOUNTER — Ambulatory Visit: Payer: BLUE CROSS/BLUE SHIELD | Admitting: Family

## 2018-12-11 ENCOUNTER — Other Ambulatory Visit: Payer: Self-pay | Admitting: Family

## 2018-12-11 DIAGNOSIS — F411 Generalized anxiety disorder: Secondary | ICD-10-CM

## 2018-12-12 NOTE — Telephone Encounter (Signed)
Last seen 09/05/18

## 2018-12-23 ENCOUNTER — Other Ambulatory Visit: Payer: Self-pay | Admitting: Family

## 2018-12-23 DIAGNOSIS — N3946 Mixed incontinence: Secondary | ICD-10-CM

## 2019-01-05 ENCOUNTER — Telehealth: Payer: Self-pay

## 2019-01-05 MED ORDER — OXYBUTYNIN CHLORIDE ER 15 MG PO TB24: 15.0000 mg | ORAL_TABLET | Freq: Every day | ORAL | 1 refills | Status: DC

## 2019-01-05 NOTE — Telephone Encounter (Signed)
[name] was denied for prior auth  Formularies are flavoxate, oxybutynin immediate release, oxybutynin ER tolterodine IR trospium IR trospium ER darifenacine ER

## 2019-01-05 NOTE — Addendum Note (Signed)
Addended by: Evelina Dun A on: 01/05/2019 01:54 PM   Modules accepted: Orders

## 2019-01-05 NOTE — Telephone Encounter (Signed)
Will stop Myrbetiq and start Oxybutynin per insurance.

## 2019-01-14 ENCOUNTER — Other Ambulatory Visit: Payer: Self-pay | Admitting: Family

## 2019-01-14 DIAGNOSIS — F411 Generalized anxiety disorder: Secondary | ICD-10-CM

## 2019-01-23 ENCOUNTER — Other Ambulatory Visit: Payer: Self-pay | Admitting: Family

## 2019-01-23 DIAGNOSIS — F411 Generalized anxiety disorder: Secondary | ICD-10-CM

## 2019-01-23 DIAGNOSIS — M159 Polyosteoarthritis, unspecified: Secondary | ICD-10-CM

## 2019-01-23 NOTE — Telephone Encounter (Signed)
Needs to be seen

## 2019-01-23 NOTE — Telephone Encounter (Signed)
NA-cb 2/3

## 2019-01-30 ENCOUNTER — Telehealth: Payer: Self-pay | Admitting: Family

## 2019-01-30 DIAGNOSIS — F411 Generalized anxiety disorder: Secondary | ICD-10-CM

## 2019-01-30 MED ORDER — ALPRAZOLAM 0.5 MG PO TABS
ORAL_TABLET | ORAL | 2 refills | Status: DC
Start: 2019-01-30 — End: 2019-02-28

## 2019-01-30 NOTE — Telephone Encounter (Signed)
Prescription sent to pharmacy.

## 2019-01-31 ENCOUNTER — Ambulatory Visit: Payer: BLUE CROSS/BLUE SHIELD | Admitting: Physician Assistant

## 2019-02-07 ENCOUNTER — Ambulatory Visit: Payer: BLUE CROSS/BLUE SHIELD | Admitting: Family

## 2019-02-12 ENCOUNTER — Other Ambulatory Visit: Payer: Self-pay | Admitting: Family

## 2019-02-12 DIAGNOSIS — J301 Allergic rhinitis due to pollen: Secondary | ICD-10-CM

## 2019-02-13 NOTE — Telephone Encounter (Signed)
Last seen 09/05/18

## 2019-02-21 ENCOUNTER — Other Ambulatory Visit: Payer: Self-pay | Admitting: Family

## 2019-02-25 ENCOUNTER — Other Ambulatory Visit: Payer: Self-pay | Admitting: Family

## 2019-02-25 DIAGNOSIS — K219 Gastro-esophageal reflux disease without esophagitis: Secondary | ICD-10-CM

## 2019-02-28 ENCOUNTER — Encounter: Payer: Self-pay | Admitting: Family

## 2019-02-28 ENCOUNTER — Ambulatory Visit (INDEPENDENT_AMBULATORY_CARE_PROVIDER_SITE_OTHER): Payer: PRIVATE HEALTH INSURANCE

## 2019-02-28 ENCOUNTER — Ambulatory Visit (INDEPENDENT_AMBULATORY_CARE_PROVIDER_SITE_OTHER): Payer: PRIVATE HEALTH INSURANCE | Admitting: Family

## 2019-02-28 VITALS — BP 107/71 | HR 96 | Temp 97.8°F | Ht 67.0 in | Wt 217.4 lb

## 2019-02-28 DIAGNOSIS — M542 Cervicalgia: Secondary | ICD-10-CM | POA: Diagnosis not present

## 2019-02-28 DIAGNOSIS — Z79899 Other long term (current) drug therapy: Secondary | ICD-10-CM | POA: Diagnosis not present

## 2019-02-28 DIAGNOSIS — R682 Dry mouth, unspecified: Secondary | ICD-10-CM

## 2019-02-28 DIAGNOSIS — K219 Gastro-esophageal reflux disease without esophagitis: Secondary | ICD-10-CM

## 2019-02-28 DIAGNOSIS — J449 Chronic obstructive pulmonary disease, unspecified: Secondary | ICD-10-CM | POA: Diagnosis not present

## 2019-02-28 DIAGNOSIS — E785 Hyperlipidemia, unspecified: Secondary | ICD-10-CM

## 2019-02-28 DIAGNOSIS — F411 Generalized anxiety disorder: Secondary | ICD-10-CM

## 2019-02-28 DIAGNOSIS — Z85819 Personal history of malignant neoplasm of unspecified site of lip, oral cavity, and pharynx: Secondary | ICD-10-CM

## 2019-02-28 DIAGNOSIS — E1169 Type 2 diabetes mellitus with other specified complication: Secondary | ICD-10-CM

## 2019-02-28 DIAGNOSIS — N3946 Mixed incontinence: Secondary | ICD-10-CM

## 2019-02-28 DIAGNOSIS — F331 Major depressive disorder, recurrent, moderate: Secondary | ICD-10-CM

## 2019-02-28 DIAGNOSIS — R5383 Other fatigue: Secondary | ICD-10-CM

## 2019-02-28 LAB — BAYER DCA HB A1C WAIVED: HB A1C: 6.3 % (ref <7.0)

## 2019-02-28 MED ORDER — PILOCARPINE HCL 5 MG PO TABS: 5.0000 mg | ORAL_TABLET | Freq: Three times a day (TID) | ORAL | 0 refills | Status: DC

## 2019-02-28 MED ORDER — ALPRAZOLAM 0.5 MG PO TABS: ORAL_TABLET | ORAL | 2 refills | Status: DC

## 2019-02-28 NOTE — Progress Notes (Signed)
Subjective:    Patient ID: [name], female    DOB: 1956-04-10, 63 y.o.   MRN: [id number] Chief Complaint  Patient presents with  . Medical Management of Chronic Issues    PT presents to the office today for chronic follow up. States her dad passed in December and has had a hard time dealing with this.   Pt history of Left submandibular cancerand Oncologists and was followed by them annually.PT had been followed by Urologists for urinary incontinence.   Diabetes  She presents for her follow-up diabetic visit. She has type 2 diabetes mellitus. Her disease course has been worsening. Hypoglycemia symptoms include nervousness/anxiousness. Pertinent negatives for hypoglycemia include no headaches. Pertinent negatives for diabetes include no blurred vision, no foot paresthesias, no foot ulcerations and no visual change. There are no hypoglycemic complications. Symptoms are stable. Pertinent negatives for diabetic complications include no CVA, heart disease or peripheral neuropathy. Risk factors for coronary artery disease include diabetes mellitus, dyslipidemia, hypertension and sedentary lifestyle. She is following a generally healthy diet. (Does not check BS at home) Eye exam is not current.  Gastroesophageal Reflux  She reports no belching, no coughing or no heartburn. This is a chronic problem. The current episode started more than 1 year ago. The problem occurs occasionally. The problem has been waxing and waning. The symptoms are aggravated by certain foods. She has tried a PPI for the symptoms. The treatment provided moderate relief.  Hyperlipidemia  This is a chronic problem. The current episode started more than 1 year ago. The problem is uncontrolled. Recent lipid tests were reviewed and are high. Exacerbating diseases include obesity. Pertinent negatives include no leg pain. Current antihyperlipidemic treatment includes statins. The current treatment provides moderate improvement  of lipids.  Anxiety  Presents for follow-up visit. Symptoms include decreased concentration, excessive worry, insomnia, irritability and nervous/anxious behavior. Symptoms occur most days. The severity of symptoms is moderate.    Depression         This is a chronic problem.  The current episode started more than 1 year ago.   The onset quality is gradual.   The problem occurs intermittently.  Associated symptoms include decreased concentration, insomnia, irritable, decreased interest and sad.  Associated symptoms include no helplessness, no hopelessness and no headaches.  Past medical history includes anxiety.   Neck Pain   This is a new problem. The current episode started more than 1 month ago. The problem occurs intermittently. The pain is associated with nothing. The pain is present in the left side. The quality of the pain is described as aching. The pain is mild. Pertinent negatives include no headaches, leg pain, photophobia, syncope or visual change.      Review of Systems  Constitutional: Positive for irritability.  Eyes: Negative for blurred vision and photophobia.  Respiratory: Negative for cough.   Cardiovascular: Negative for syncope.  Gastrointestinal: Negative for heartburn.  Musculoskeletal: Positive for neck pain.  Neurological: Negative for headaches.  Psychiatric/Behavioral: Positive for decreased concentration and depression. The patient is nervous/anxious and has insomnia.   All other systems reviewed and are negative.      Objective:   Physical Exam Vitals signs reviewed.  Constitutional:      General: She is irritable. She is not in acute distress.    Appearance: She is well-developed.  HENT:     Head: Normocephalic and atraumatic.     Right Ear: Tympanic membrane normal.     Left Ear: Tympanic membrane normal.  Eyes:     Pupils: Pupils are equal, round, and reactive to light.  Neck:     Musculoskeletal: Normal range of motion and neck supple.      Thyroid: No thyromegaly.  Cardiovascular:     Rate and Rhythm: Normal rate and regular rhythm.     Heart sounds: Normal heart sounds. No murmur.  Pulmonary:     Effort: Pulmonary effort is normal. No respiratory distress.     Breath sounds: Normal breath sounds. No wheezing.  Abdominal:     General: Bowel sounds are normal. There is no distension.     Palpations: Abdomen is soft.     Tenderness: There is no abdominal tenderness.  Musculoskeletal: Normal range of motion.        General: No tenderness.     Comments: Full rom of neck, slight discomfort with extension  Skin:    General: Skin is warm and dry.  Neurological:     Mental Status: She is alert and oriented to person, place, and time.     Cranial Nerves: No cranial nerve deficit.     Deep Tendon Reflexes: Reflexes are normal and symmetric.  Psychiatric:        Mood and Affect: Mood is anxious. Affect is tearful.        Behavior: Behavior is hyperactive.        Thought Content: Thought content normal.        Judgment: Judgment normal.       BP 107/71   Pulse 96   Temp 97.8 F (36.6 C) (Oral)   Ht '5\' 7"'$  (1.702 m)   Wt 217 lb 6.4 oz (98.6 kg)   BMI 34.05 kg/m      Assessment & Plan:  CANIYA TAGLE comes in today with chief complaint of Medical Management of Chronic Issues   Diagnosis and orders addressed:  1. Type 2 diabetes mellitus with other specified complication, without long-term current use of insulin (HCC)  - CMP14+EGFR - CBC with Differential/Platelet - Bayer DCA Hb A1c Waived - Microalbumin / creatinine urine ratio  2. Chronic obstructive pulmonary disease, unspecified COPD type ([place]) - CMP14+EGFR - CBC with Differential/Platelet  3. Gastroesophageal reflux disease, esophagitis presence not specified - CMP14+EGFR - CBC with Differential/Platelet  4. Hyperlipidemia associated with type 2 diabetes mellitus ([place]) - CMP14+EGFR - CBC with Differential/Platelet - Lipid panel  5. Controlled  substance agreement signed - CMP14+EGFR - CBC with Differential/Platelet - ToxASSURE Select 13 (MW), Urine  6. Major depressive disorder, recurrent episode, moderate (HCC) - CMP14+EGFR - CBC with Differential/Platelet - Ambulatory referral to Psychiatry - ToxASSURE Select 13 (MW), Urine  7. Mixed incontinence urge and stress - CMP14+EGFR - CBC with Differential/Platelet  8. GAD (generalized anxiety disorder) - CMP14+EGFR - CBC with Differential/Platelet - ToxASSURE Select 13 (MW), Urine - ALPRAZolam (XANAX) 0.5 MG tablet; TAKE 1 TABLET BY MOUTH TWICE DAILY FOR ANXIETY  Dispense: 60 tablet; Refill: 2  9. H/O malignant neoplasm of submandibular gland - CMP14+EGFR - CBC with Differential/Platelet - Ambulatory referral to Oncology  10. Fatigue, unspecified type - CMP14+EGFR - CBC with Differential/Platelet - TSH  11. Neck pain - DG Cervical Spine Complete; Future  12. Dry mouth - pilocarpine (SALAGEN) 5 MG tablet; Take 1 tablet (5 mg total) by mouth 3 (three) times daily.  Dispense: 90 tablet; Refill: 0   Labs pending Health Maintenance reviewed Diet and exercise encouraged  Follow up plan: 2 weeks to recheck neck pain and generalized joint pain  Evelina Dun, FNP

## 2019-02-28 NOTE — Patient Instructions (Signed)

## 2019-03-01 LAB — CMP14+EGFR
ALT: 13 IU/L (ref 0–32)
AST: 14 IU/L (ref 0–40)
Albumin/Globulin Ratio: 1.8 (ref 1.2–2.2)
Albumin: 4.2 g/dL (ref 3.8–4.8)
Alkaline Phosphatase: 83 IU/L (ref 39–117)
BUN/Creatinine Ratio: 14 (ref 12–28)
BUN: 14 mg/dL (ref 8–27)
CHLORIDE: 98 mmol/L (ref 96–106)
CO2: 21 mmol/L (ref 20–29)
Calcium: 9.3 mg/dL (ref 8.7–10.3)
Creatinine, Ser: 0.98 mg/dL (ref 0.57–1.00)
GFR calc non Af Amer: 61 mL/min/{1.73_m2} (ref >59)
GFR, EST AFRICAN AMERICAN: 71 mL/min/{1.73_m2} (ref >59)
GLOBULIN, TOTAL: 2.3 g/dL (ref 1.5–4.5)
Glucose: 181 mg/dL — ABNORMAL HIGH (ref 65–99)
POTASSIUM: 4.6 mmol/L (ref 3.5–5.2)
SODIUM: 138 mmol/L (ref 134–144)
TOTAL PROTEIN: 6.5 g/dL (ref 6.0–8.5)

## 2019-03-01 LAB — LIPID PANEL
CHOLESTEROL TOTAL: 164 mg/dL (ref 100–199)
Chol/HDL Ratio: 2.5 ratio (ref 0.0–4.4)
HDL: 65 mg/dL (ref >39)
LDL CALC: 81 mg/dL (ref 0–99)
Triglycerides: 90 mg/dL (ref 0–149)
VLDL Cholesterol Cal: 18 mg/dL (ref 5–40)

## 2019-03-01 LAB — CBC WITH DIFFERENTIAL/PLATELET
BASOS ABS: 0 10*3/uL (ref 0.0–0.2)
Basos: 1 %
EOS (ABSOLUTE): 0.3 10*3/uL (ref 0.0–0.4)
Eos: 5 %
Hematocrit: 34.6 % (ref 34.0–46.6)
Hemoglobin: 11.7 g/dL (ref 11.1–15.9)
Immature Grans (Abs): 0 10*3/uL (ref 0.0–0.1)
Immature Granulocytes: 0 %
LYMPHS ABS: 1.2 10*3/uL (ref 0.7–3.1)
Lymphs: 22 %
MCH: 28.7 pg (ref 26.6–33.0)
MCHC: 33.8 g/dL (ref 31.5–35.7)
MCV: 85 fL (ref 79–97)
MONOCYTES: 7 %
Monocytes Absolute: 0.4 10*3/uL (ref 0.1–0.9)
NEUTROS ABS: 3.5 10*3/uL (ref 1.4–7.0)
Neutrophils: 65 %
Platelets: 318 10*3/uL (ref 150–450)
RBC: 4.08 x10E6/uL (ref 3.77–5.28)
RDW: 13.2 % (ref 11.7–15.4)
WBC: 5.4 10*3/uL (ref 3.4–10.8)

## 2019-03-01 LAB — MICROALBUMIN / CREATININE URINE RATIO
CREATININE, UR: 114.4 mg/dL
MICROALB/CREAT RATIO: 3 mg/g{creat} (ref 0–29)
MICROALBUM., U, RANDOM: 3 ug/mL

## 2019-03-01 LAB — TSH: TSH: 2.28 u[IU]/mL (ref 0.450–4.500)

## 2019-03-06 LAB — TOXASSURE SELECT 13 (MW), URINE

## 2019-03-10 ENCOUNTER — Other Ambulatory Visit: Payer: Self-pay | Admitting: Family

## 2019-03-10 DIAGNOSIS — M159 Polyosteoarthritis, unspecified: Secondary | ICD-10-CM

## 2019-03-14 ENCOUNTER — Encounter: Payer: Self-pay | Admitting: Family

## 2019-03-14 ENCOUNTER — Other Ambulatory Visit: Payer: Self-pay

## 2019-03-14 ENCOUNTER — Ambulatory Visit (INDEPENDENT_AMBULATORY_CARE_PROVIDER_SITE_OTHER): Payer: PRIVATE HEALTH INSURANCE | Admitting: Family

## 2019-03-14 VITALS — BP 116/70 | HR 89 | Temp 98.6°F | Ht 67.0 in | Wt 218.0 lb

## 2019-03-14 DIAGNOSIS — F4310 Post-traumatic stress disorder, unspecified: Secondary | ICD-10-CM

## 2019-03-14 DIAGNOSIS — F331 Major depressive disorder, recurrent, moderate: Secondary | ICD-10-CM

## 2019-03-14 DIAGNOSIS — M1991 Primary osteoarthritis, unspecified site: Secondary | ICD-10-CM

## 2019-03-14 DIAGNOSIS — F411 Generalized anxiety disorder: Secondary | ICD-10-CM | POA: Diagnosis not present

## 2019-03-14 MED ORDER — DICLOFENAC SODIUM 1 % TD GEL: 2.0000 g | Freq: Four times a day (QID) | TRANSDERMAL | 6 refills | Status: DC

## 2019-03-14 MED ORDER — BUPROPION HCL ER (XL) 300 MG PO TB24: 300.0000 mg | ORAL_TABLET | Freq: Every day | ORAL | 1 refills | Status: DC

## 2019-03-14 NOTE — Progress Notes (Signed)
Subjective:    Patient ID: [name], female    DOB: 10-28-1956, 63 y.o.   MRN: [id number]  Chief Complaint  Patient presents with  . back and hip pain   Pt presents to the office today with increased back and arthritis pain. She states she just noticed that she has not been taking her mobic.  Back Pain  This is a chronic problem. The current episode started more than 1 year ago. The problem occurs constantly. The problem has been waxing and waning since onset. The quality of the pain is described as aching.  Arthritis  Presents for follow-up visit. She complains of pain and stiffness. The symptoms have been stable. Affected locations include the right hip, left knee and right knee (lower back). Her pain is at a severity of 10/10. Associated symptoms include fatigue.  Anxiety  Presents for follow-up visit. Symptoms include excessive worry, insomnia, irritability, nervous/anxious behavior and restlessness. Symptoms occur most days. The severity of symptoms is moderate.    Depression         This is a chronic problem.  The current episode started more than 1 year ago.   The onset quality is gradual.   The problem occurs intermittently.  The problem has been waxing and waning since onset.  Associated symptoms include fatigue, helplessness, hopelessness, insomnia, irritable, restlessness, decreased interest and sad.  Past medical history includes anxiety.       Review of Systems  Constitutional: Positive for fatigue and irritability.  Musculoskeletal: Positive for arthritis, back pain and stiffness.  Psychiatric/Behavioral: Positive for depression. The patient is nervous/anxious and has insomnia.   All other systems reviewed and are negative.      Objective:   Physical Exam Vitals signs reviewed.  Constitutional:      General: She is irritable. She is not in acute distress.    Appearance: She is well-developed.  HENT:     Head: Normocephalic and atraumatic.     Right Ear:  Tympanic membrane normal.     Left Ear: Tympanic membrane normal.  Eyes:     Pupils: Pupils are equal, round, and reactive to light.  Neck:     Musculoskeletal: Normal range of motion and neck supple.     Thyroid: No thyromegaly.  Cardiovascular:     Rate and Rhythm: Normal rate and regular rhythm.     Heart sounds: Normal heart sounds. No murmur.  Pulmonary:     Effort: Pulmonary effort is normal. No respiratory distress.     Breath sounds: Normal breath sounds. No wheezing.  Abdominal:     General: Bowel sounds are normal. There is no distension.     Palpations: Abdomen is soft.     Tenderness: There is no abdominal tenderness.  Musculoskeletal: Normal range of motion.        General: No tenderness.  Skin:    General: Skin is warm and dry.  Neurological:     Mental Status: She is alert and oriented to person, place, and time.     Cranial Nerves: No cranial nerve deficit.     Deep Tendon Reflexes: Reflexes are normal and symmetric.  Psychiatric:        Mood and Affect: Mood is anxious.        Behavior: Behavior is hyperactive.        Thought Content: Thought content normal.        Judgment: Judgment normal.       BP 116/70   Pulse 89  Temp 98.6 F (37 C) (Oral)   Ht 5\' 7"  (1.702 m)   Wt 218 lb (98.9 kg)   BMI 34.14 kg/m      Assessment & Plan:  [name] comes in today with chief complaint of back and hip pain   Diagnosis and orders addressed:  1. Primary osteoarthritis, unspecified site Restart mobic daily Start Diclofenac gel ROM exercises encouraged - diclofenac sodium (VOLTAREN) 1 % GEL; Apply 2 g topically 4 (four) times daily.  Dispense: 100 g; Refill: 6  2. Major depressive disorder, recurrent episode, moderate (HCC)  3. PTSD (post-traumatic stress disorder) Continue Prozac daily and will increase Wellbutrin to 300 mg from 150 mg  Stress management discussed She will call and make Behavorial Health follow up - buPROPion (WELLBUTRIN XL)  300 MG 24 hr tablet; Take 1 tablet (300 mg total) by mouth daily.  Dispense: 90 tablet; Refill: 1  4.  - buPROPion (WELLBUTRIN XL) 300 MG 24 hr tablet; Take 1 tablet (300 mg total) by mouth daily.  Dispense: 90 tablet; Refill: 1  5. GAD (generalized anxiety disorder) - buPROPion (WELLBUTRIN XL) 300 MG 24 hr tablet; Take 1 tablet (300 mg total) by mouth daily.  Dispense: 90 tablet; Refill: 1   Labs reviewed Health Maintenance reviewed Diet and exercise encouraged  Follow up plan: 6 weeks    Evelina Dun, FNP

## 2019-03-14 NOTE — Patient Instructions (Signed)

## 2019-03-15 ENCOUNTER — Other Ambulatory Visit: Payer: Self-pay | Admitting: Family

## 2019-03-15 DIAGNOSIS — F331 Major depressive disorder, recurrent, moderate: Secondary | ICD-10-CM

## 2019-03-15 DIAGNOSIS — E785 Hyperlipidemia, unspecified: Principal | ICD-10-CM

## 2019-03-15 DIAGNOSIS — F411 Generalized anxiety disorder: Secondary | ICD-10-CM

## 2019-03-15 DIAGNOSIS — E1169 Type 2 diabetes mellitus with other specified complication: Secondary | ICD-10-CM

## 2019-04-08 ENCOUNTER — Other Ambulatory Visit: Payer: Self-pay | Admitting: Family

## 2019-04-08 DIAGNOSIS — R682 Dry mouth, unspecified: Secondary | ICD-10-CM

## 2019-04-26 ENCOUNTER — Ambulatory Visit: Payer: PRIVATE HEALTH INSURANCE | Admitting: Family

## 2019-04-28 ENCOUNTER — Other Ambulatory Visit: Payer: Self-pay

## 2019-05-01 ENCOUNTER — Other Ambulatory Visit: Payer: Self-pay

## 2019-05-01 ENCOUNTER — Encounter: Payer: Self-pay | Admitting: Family

## 2019-05-01 ENCOUNTER — Ambulatory Visit (INDEPENDENT_AMBULATORY_CARE_PROVIDER_SITE_OTHER): Payer: PRIVATE HEALTH INSURANCE | Admitting: Family

## 2019-05-01 DIAGNOSIS — F4310 Post-traumatic stress disorder, unspecified: Secondary | ICD-10-CM | POA: Diagnosis not present

## 2019-05-01 DIAGNOSIS — N3946 Mixed incontinence: Secondary | ICD-10-CM | POA: Diagnosis not present

## 2019-05-01 DIAGNOSIS — R682 Dry mouth, unspecified: Secondary | ICD-10-CM | POA: Diagnosis not present

## 2019-05-01 DIAGNOSIS — F331 Major depressive disorder, recurrent, moderate: Secondary | ICD-10-CM

## 2019-05-01 MED ORDER — PILOCARPINE HCL 5 MG PO TABS: 10.0000 mg | ORAL_TABLET | Freq: Three times a day (TID) | ORAL | 2 refills | Status: DC

## 2019-05-01 MED ORDER — MIRABEGRON ER 50 MG PO TB24: 50.0000 mg | ORAL_TABLET | Freq: Every day | ORAL | 1 refills | Status: DC

## 2019-05-01 NOTE — Progress Notes (Signed)
Virtual Visit via telephone Note  I connected with [name] on 05/01/19 at 12:00 pm by telephone and verified that I am speaking with the correct person using two identifiers. [name] is currently located at home and no one is currently with her during visit. The provider, Evelina Dun, FNP is located in their office at time of visit.  I discussed the limitations, risks, security and privacy concerns of performing an evaluation and management service by telephone and the availability of in person appointments. I also discussed with the patient that there may be a patient responsible charge related to this service. The patient expressed understanding and agreed to proceed.   History and Present Illness:  PT calls today to recheck GAD, PTSD,  and Depression. She was seen on 03/14/19 and we increased her Wellbutrin to 300 mg from 150 mg. She states her mood has improved and does not feel as "on edge". She has not called her behavorial health.   Complaining of mouth dryness. She is currently taking Salagen 5 mg TID that helps slightly. She is taking her oxybutynin 15 mg daily for over active bladder. States she continues to get up 2-3 times a night. Her urinary incontinence has improved.  Anxiety  Presents for follow-up visit. Symptoms include decreased concentration, depressed mood, excessive worry, insomnia, irritability, nervous/anxious behavior and restlessness. Symptoms occur occasionally. The severity of symptoms is moderate. The quality of sleep is good.    Depression         This is a chronic problem.  The current episode started more than 1 year ago.   The onset quality is gradual.   The problem occurs intermittently.  Associated symptoms include decreased concentration, insomnia, irritable, restlessness and sad.  Associated symptoms include no helplessness and no hopelessness.  Past treatments include SSRIs - Selective serotonin reuptake inhibitors.  Past medical history  includes anxiety.       Review of Systems  Constitutional: Positive for irritability.  Psychiatric/Behavioral: Positive for decreased concentration and depression. The patient is nervous/anxious and has insomnia.      Observations/Objective: No SOB or distress, anxious  Assessment and Plan: [name] comes in today with chief complaint of No chief complaint on file.   Diagnosis and orders addressed:  1. PTSD (post-traumatic stress disorder) -Continue al medications  -Call and schedule follow up appt with Behavorial health  2. Major depressive disorder, recurrent episode, moderate (HCC)   3. Mouth dryness Will increase Salagen to 10 mg TID from 5 mg Will change oxybutynin to [name] - pilocarpine (SALAGEN) 5 MG tablet; Take 2 tablets (10 mg total) by mouth 3 (three) times daily.  Dispense: 180 tablet; Refill: 2 - mirabegron ER ([name]) 50 MG TB24 tablet; Take 1 tablet (50 mg total) by mouth daily.  Dispense: 90 tablet; Refill: 1  4. Dry mouth  5. Mixed incontinence urge and stress Will change Oxybutynin to [name]  Limit caffeine Keep Urologists follow up  - mirabegron ER ([name]) 50 MG TB24 tablet; Take 1 tablet (50 mg total) by mouth daily.  Dispense: 90 tablet; Refill: 1   Health Maintenance reviewed Diet and exercise encouraged  Follow up plan: 3 months       I discussed the assessment and treatment plan with the patient. The patient was provided an opportunity to ask questions and all were answered. The patient agreed with the plan and demonstrated an understanding of the instructions.   The patient was advised to call back or  seek an in-person evaluation if the symptoms worsen or if the condition fails to improve as anticipated.  The above assessment and management plan was discussed with the patient. The patient verbalized understanding of and has agreed to the management plan. Patient is aware to call the clinic if symptoms persist or  worsen. Patient is aware when to return to the clinic for a follow-up visit. Patient educated on when it is appropriate to go to the emergency department.   Time call ended:  12:15 pm  I provided 15 minutes of non-face-to-face time during this encounter.    Evelina Dun, FNP

## 2019-05-03 ENCOUNTER — Telehealth: Payer: Self-pay

## 2019-05-03 NOTE — Telephone Encounter (Signed)
FYI, insurance denied prior authorization on [name].  Patient must try two of the following formulary medications for at least 30 days each:  Flavoxate Tolterodine immediate release Oxybutynin extended release Trospium immediate release Trospium extended release Darifenacin extended release

## 2019-05-04 NOTE — Telephone Encounter (Signed)
She has been on Oxybutynin extended release for greater then 60 days with extreme mouth dryness.

## 2019-05-04 NOTE — Telephone Encounter (Signed)
She still has to try and fail one more medication listed before we can submit a new prior authorization for [name].

## 2019-05-09 MED ORDER — TOLTERODINE TARTRATE ER 4 MG PO CP24: 4.0000 mg | ORAL_CAPSULE | Freq: Every day | ORAL | 1 refills | Status: DC

## 2019-05-09 NOTE — Telephone Encounter (Signed)
Patient aware and verbalizes understanding. 

## 2019-05-09 NOTE — Telephone Encounter (Signed)
Insurance denied [name]. I have sent in Detrol LA to your pharmacy. Let me know if this does not work as then we can try the [name] again.

## 2019-05-09 NOTE — Addendum Note (Signed)
Addended by: Evelina Dun A on: 05/09/2019 12:47 PM   Modules accepted: Orders

## 2019-05-10 ENCOUNTER — Other Ambulatory Visit: Payer: Self-pay | Admitting: Family

## 2019-05-10 ENCOUNTER — Telehealth: Payer: Self-pay

## 2019-05-10 DIAGNOSIS — J301 Allergic rhinitis due to pollen: Secondary | ICD-10-CM

## 2019-05-10 DIAGNOSIS — Z85819 Personal history of malignant neoplasm of unspecified site of lip, oral cavity, and pharynx: Secondary | ICD-10-CM

## 2019-05-10 NOTE — Telephone Encounter (Signed)
Patient needs to have new referral to the [place] at [facility], diagnosis h/o malignant neoplasm of submandibular gland.

## 2019-05-11 DIAGNOSIS — Z85819 Personal history of malignant neoplasm of unspecified site of lip, oral cavity, and pharynx: Secondary | ICD-10-CM | POA: Insufficient documentation

## 2019-05-11 NOTE — Telephone Encounter (Signed)
Referral placed.

## 2019-05-19 ENCOUNTER — Other Ambulatory Visit: Payer: Self-pay | Admitting: Family

## 2019-05-19 DIAGNOSIS — E119 Type 2 diabetes mellitus without complications: Secondary | ICD-10-CM

## 2019-05-19 DIAGNOSIS — K219 Gastro-esophageal reflux disease without esophagitis: Secondary | ICD-10-CM

## 2019-06-09 ENCOUNTER — Other Ambulatory Visit: Payer: Self-pay | Admitting: Family

## 2019-06-09 DIAGNOSIS — F331 Major depressive disorder, recurrent, moderate: Secondary | ICD-10-CM

## 2019-06-09 DIAGNOSIS — F411 Generalized anxiety disorder: Secondary | ICD-10-CM

## 2019-07-11 ENCOUNTER — Other Ambulatory Visit: Payer: Self-pay | Admitting: Family

## 2019-07-11 DIAGNOSIS — M159 Polyosteoarthritis, unspecified: Secondary | ICD-10-CM

## 2019-07-11 DIAGNOSIS — F411 Generalized anxiety disorder: Secondary | ICD-10-CM

## 2019-07-11 DIAGNOSIS — R682 Dry mouth, unspecified: Secondary | ICD-10-CM

## 2019-07-24 ENCOUNTER — Other Ambulatory Visit: Payer: Self-pay | Admitting: Family

## 2019-07-24 ENCOUNTER — Telehealth: Payer: Self-pay | Admitting: Family

## 2019-07-24 DIAGNOSIS — C080 Malignant neoplasm of submandibular gland: Secondary | ICD-10-CM

## 2019-07-24 NOTE — Progress Notes (Signed)
Referral placed.

## 2019-08-01 ENCOUNTER — Encounter (HOSPITAL_COMMUNITY): Payer: Self-pay | Admitting: *Deleted

## 2019-08-02 ENCOUNTER — Inpatient Hospital Stay (HOSPITAL_COMMUNITY): Payer: PRIVATE HEALTH INSURANCE | Attending: Hematology | Admitting: Hematology

## 2019-08-02 ENCOUNTER — Other Ambulatory Visit: Payer: Self-pay

## 2019-08-02 ENCOUNTER — Inpatient Hospital Stay (HOSPITAL_COMMUNITY): Payer: PRIVATE HEALTH INSURANCE

## 2019-08-02 ENCOUNTER — Encounter (HOSPITAL_COMMUNITY): Payer: Self-pay | Admitting: Hematology

## 2019-08-02 VITALS — BP 136/61 | HR 95 | Temp 97.8°F | Resp 18 | Ht 67.0 in | Wt 223.8 lb

## 2019-08-02 DIAGNOSIS — Z171 Estrogen receptor negative status [ER-]: Secondary | ICD-10-CM | POA: Insufficient documentation

## 2019-08-02 DIAGNOSIS — Z7984 Long term (current) use of oral hypoglycemic drugs: Secondary | ICD-10-CM | POA: Insufficient documentation

## 2019-08-02 DIAGNOSIS — R51 Headache: Secondary | ICD-10-CM | POA: Diagnosis not present

## 2019-08-02 DIAGNOSIS — R42 Dizziness and giddiness: Secondary | ICD-10-CM | POA: Diagnosis not present

## 2019-08-02 DIAGNOSIS — Z8719 Personal history of other diseases of the digestive system: Secondary | ICD-10-CM | POA: Diagnosis not present

## 2019-08-02 DIAGNOSIS — Z833 Family history of diabetes mellitus: Secondary | ICD-10-CM | POA: Diagnosis not present

## 2019-08-02 DIAGNOSIS — J449 Chronic obstructive pulmonary disease, unspecified: Secondary | ICD-10-CM | POA: Diagnosis not present

## 2019-08-02 DIAGNOSIS — K449 Diaphragmatic hernia without obstruction or gangrene: Secondary | ICD-10-CM | POA: Diagnosis not present

## 2019-08-02 DIAGNOSIS — M47812 Spondylosis without myelopathy or radiculopathy, cervical region: Secondary | ICD-10-CM | POA: Insufficient documentation

## 2019-08-02 DIAGNOSIS — Z800 Family history of malignant neoplasm of digestive organs: Secondary | ICD-10-CM | POA: Diagnosis not present

## 2019-08-02 DIAGNOSIS — C080 Malignant neoplasm of submandibular gland: Secondary | ICD-10-CM

## 2019-08-02 DIAGNOSIS — Z853 Personal history of malignant neoplasm of breast: Secondary | ICD-10-CM | POA: Diagnosis not present

## 2019-08-02 DIAGNOSIS — K219 Gastro-esophageal reflux disease without esophagitis: Secondary | ICD-10-CM | POA: Diagnosis not present

## 2019-08-02 DIAGNOSIS — Z880 Allergy status to penicillin: Secondary | ICD-10-CM | POA: Diagnosis not present

## 2019-08-02 DIAGNOSIS — Z79899 Other long term (current) drug therapy: Secondary | ICD-10-CM | POA: Diagnosis not present

## 2019-08-02 DIAGNOSIS — Z923 Personal history of irradiation: Secondary | ICD-10-CM | POA: Diagnosis not present

## 2019-08-02 DIAGNOSIS — K5900 Constipation, unspecified: Secondary | ICD-10-CM | POA: Diagnosis not present

## 2019-08-02 DIAGNOSIS — Z87891 Personal history of nicotine dependence: Secondary | ICD-10-CM | POA: Insufficient documentation

## 2019-08-02 DIAGNOSIS — Z8249 Family history of ischemic heart disease and other diseases of the circulatory system: Secondary | ICD-10-CM | POA: Diagnosis not present

## 2019-08-02 DIAGNOSIS — Z809 Family history of malignant neoplasm, unspecified: Secondary | ICD-10-CM | POA: Diagnosis not present

## 2019-08-02 DIAGNOSIS — E119 Type 2 diabetes mellitus without complications: Secondary | ICD-10-CM | POA: Insufficient documentation

## 2019-08-02 DIAGNOSIS — Z9221 Personal history of antineoplastic chemotherapy: Secondary | ICD-10-CM | POA: Insufficient documentation

## 2019-08-02 DIAGNOSIS — R682 Dry mouth, unspecified: Secondary | ICD-10-CM | POA: Insufficient documentation

## 2019-08-02 DIAGNOSIS — R200 Anesthesia of skin: Secondary | ICD-10-CM | POA: Diagnosis not present

## 2019-08-02 DIAGNOSIS — Z1239 Encounter for other screening for malignant neoplasm of breast: Secondary | ICD-10-CM

## 2019-08-02 DIAGNOSIS — Z818 Family history of other mental and behavioral disorders: Secondary | ICD-10-CM | POA: Insufficient documentation

## 2019-08-02 LAB — CBC WITH DIFFERENTIAL/PLATELET
Abs Immature Granulocytes: 0.01 10*3/uL (ref 0.00–0.07)
Basophils Absolute: 0.1 10*3/uL (ref 0.0–0.1)
Basophils Relative: 1 %
Eosinophils Absolute: 0.2 10*3/uL (ref 0.0–0.5)
Eosinophils Relative: 4 %
HCT: 38 % (ref 36.0–46.0)
Hemoglobin: 11.9 g/dL — ABNORMAL LOW (ref 12.0–15.0)
Immature Granulocytes: 0 %
Lymphocytes Relative: 31 %
Lymphs Abs: 1.6 10*3/uL (ref 0.7–4.0)
MCH: 28.5 pg (ref 26.0–34.0)
MCHC: 31.3 g/dL (ref 30.0–36.0)
MCV: 91.1 fL (ref 80.0–100.0)
Monocytes Absolute: 0.4 10*3/uL (ref 0.1–1.0)
Monocytes Relative: 8 %
Neutro Abs: 2.9 10*3/uL (ref 1.7–7.7)
Neutrophils Relative %: 56 %
Platelets: 314 10*3/uL (ref 150–400)
RBC: 4.17 MIL/uL (ref 3.87–5.11)
RDW: 13.6 % (ref 11.5–15.5)
WBC: 5.1 10*3/uL (ref 4.0–10.5)
nRBC: 0 % (ref 0.0–0.2)

## 2019-08-02 LAB — COMPREHENSIVE METABOLIC PANEL
ALT: 15 U/L (ref 0–44)
AST: 15 U/L (ref 15–41)
Albumin: 4.2 g/dL (ref 3.5–5.0)
Alkaline Phosphatase: 64 U/L (ref 38–126)
Anion gap: 8 (ref 5–15)
BUN: 17 mg/dL (ref 8–23)
CO2: 27 mmol/L (ref 22–32)
Calcium: 9.3 mg/dL (ref 8.9–10.3)
Chloride: 101 mmol/L (ref 98–111)
Creatinine, Ser: 0.96 mg/dL (ref 0.44–1.00)
GFR calc Af Amer: >60 mL/min (ref >60)
GFR calc non Af Amer: >60 mL/min (ref >60)
Glucose, Bld: 135 mg/dL — ABNORMAL HIGH (ref 70–99)
Potassium: 4.7 mmol/L (ref 3.5–5.1)
Sodium: 136 mmol/L (ref 135–145)
Total Bilirubin: 0.4 mg/dL (ref 0.3–1.2)
Total Protein: 7.1 g/dL (ref 6.5–8.1)

## 2019-08-02 LAB — TSH: TSH: 1.218 u[IU]/mL (ref 0.350–4.500)

## 2019-08-02 NOTE — Patient Instructions (Addendum)
[place] at [facility] Discharge Instructions  You were seen today by Dr. Delton Coombes. He went over your history, family history and how you've been feeling lately. He will have blood drawn today. He will see you back after your scan for follow up.   Thank you for choosing [place] at [facility] to provide your oncology and hematology care.  To afford each patient quality time with our provider, please arrive at least 15 minutes before your scheduled appointment time.   If you have a lab appointment with the [facility] please come in thru the  Main Entrance and check in at the main information desk  You need to re-schedule your appointment should you arrive 10 or more minutes late.  We strive to give you quality time with our providers, and arriving late affects you and other patients whose appointments are after yours.  Also, if you no show three or more times for appointments you may be dismissed from the clinic at the providers discretion.     Again, thank you for choosing [facility].  Our hope is that these requests will decrease the amount of time that you wait before being seen by our physicians.       _____________________________________________________________  Should you have questions after your visit to [facility], please contact our office at [phone] between the hours of 8:00 a.m. and 4:30 p.m.  Voicemails left after 4:00 p.m. will not be returned until the following business day.  For prescription refill requests, have your pharmacy contact our office and allow 72 hours.    Cancer Center Support Programs:   > Cancer Support Group  2nd Tuesday of the month 1pm-2pm, Journey Room

## 2019-08-02 NOTE — Assessment & Plan Note (Addendum)
1.  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  She complains of dry mouth.  No difficulty swallowing. - Physical exam today did not reveal any palpable adenopathy.  No masses seen in the oropharynx. - She reported numbness sensation which goes from the left side of the neck into the left upper extremity.  C-spine x-rays on 02/28/2019 showed degenerative cervical spondylosis with multilevel disc disease and facet disease. -We will do a CT of the neck with contrast.  We will also check a TSH level as she had radiation done previously.  We will also consider referral for mirror and a fiberoptic examination.  2.  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  Pathology showed 2.1 cm IDC, margins negative, Ki-67 of 66%, ER/PR/HER-2 negative, PT 2 PN 0.  One sentinel lymph node was negative. - She underwent chemotherapy followed by radiation therapy. -Last mammogram on 06/20/2017 was BI-RADS 1.  Physical exam today did not reveal any palpable masses. -We will schedule her for another mammogram this year.  3.  Health maintenance: - Colonoscopy on 05/25/2016 shows normal colon. -EGD on the same day showed low-grade narrowings Schatzki ring.  This was dilated.  Small hiatal hernia.  Otherwise normal. - She smoked 1 and half pack per day for 25 years and quit in 1996.  She does not qualify for screening CT.

## 2019-08-02 NOTE — Progress Notes (Signed)
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  HISTORY OF PRESENTING ILLNESS:  [name] 63 y.o. female is seen at the request of Evelina Dun, FNP for left submandibular adenoid cystic carcinoma.  She had this resected on 03/05/2015 at [facility].  Pathology showed 1.5 cm adenoid cystic carcinoma, grade 2, moderately differentiated, margins uninvolved by carcinoma, positive perineural invasion, negative lymphovascular invasion, PT1PNX.  She underwent radiation therapy for 6 weeks.  She has not had any further follow-up visits.  Denies any major dysphagia but has some dryness of the throat.  She was in ex-smoker who quit in 1996.  She smoked 1 and half pack per day for 25 years.  Denies any alcohol use.  She lives at home with her separated husband.  She used to work for [employer].  Denies any fevers, night sweats or weight loss in the last 6 months.  She reportedly had a history of right breast cancer, underwent lumpectomy and sentinel lymph node biopsy on 12/01/2006.  Pathology showed 2.1 cm IDC, negative margins.  ER/PR/HER-2 receptors were also negative.  She had to receive chemotherapy followed by radiation.  Her last mammogram was on 06/20/2018.  Last colonoscopy was in 2017.  Denies any bleeding per rectum or melena.  Denies any change in bowel habits.  No new pains reported.  Complains of numbness in the left neck region, radiating to the left upper extremity.  MEDICAL HISTORY:  Past Medical History:  Diagnosis Date  . Adenoid cystic carcinoma of submandibular gland [facility]) dx 2016---  oncologist-  dr Eppie Gibson [facility] center)   Left submandibular salivary gland--  T1 N0 M0,  Grade 2 w/ perineural invasion  s/p  resection 03-05-2015   and Radiation therapy 04-29-2015 to 06-11-2015  . Allergic rhinitis   . Anemia   . Arthritis   . Bipolar disorder ([place])    [place]  . Breast cancer ([place])   . COPD (chronic obstructive pulmonary disease) ([place])   . Cystocele   . Decreased sense of taste    secondary to radiation  . GAD (generalized anxiety disorder)   . GERD (gastroesophageal reflux disease)   . Headache   . Hemorrhoid    INTERNAL AND EXTERNAL    . History of adenomatous polyp of colon   . History of breast cancer dx 2007--- oncologist-- dr Lollie Marrow--- no recurrence   Right breast DCIS ,  Stage 2 (T2 N0 M0) --  s/p  right partial mastecotmy w/ sln dissection,  chemotherapy complete 02-01-2007,  radiation completed 06-15-2007  . History of esophageal dilatation   . History of esophagitis   . History of external beam radiation therapy    04-27-2007 to 06-15-2007 , right breast- 5040cGy in 28 sessions and boost 1200 cGy in 6 sessions/   04-29-2015 to 06-11-2015 , left neck and skull base -- 60 Gy in 30 fractions  . History of hiatal hernia   . History of panic attacks   . History of perforation of tympanic membrane    AFTER ACUTE OTITIS MEDIA 2016-- RESOLVED  . Hypertriglyceridemia    ?  Marland Kitchen Loss of teeth due to extraction    secondary to radiation therapy---  x8 or 9  . Mouth dryness    secondary to radiation  . Neck pain  resuidaul neck pain from radiation  . Peripheral neuropathy   . Personal history of chemotherapy   . Personal history of radiation therapy   . TMJ (temporomandibular joint disorder)   . Type 2 diabetes mellitus ([place])   . Urge and stress incontinence   . Wears glasses     SURGICAL HISTORY: Past Surgical History:  Procedure Laterality Date  . COLONOSCOPY  03/30/2012   RMR: rectal and colonic polyps -removed as described above. Mellanosis coli  . COLONOSCOPY WITH PROPOFOL N/A 05/25/2016   Procedure: COLONOSCOPY WITH PROPOFOL;  Surgeon: Daneil Dolin, MD;  [facility];  Service:  Endoscopy;  Laterality: N/A;  1400   . ECTOPIC PREGNANCY SURGERY    . ESOPHAGOGASTRODUODENOSCOPY (EGD) WITH PROPOFOL N/A 05/25/2016   Rourk: small hh, status post dilation of Schatzki ring, normal colon. Next colonoscopy June 2022  . EXCISION / BIOPSY BREAST / NIPPLE / DUCT    . INTERSTIM IMPLANT PLACEMENT N/A 08/11/2016   Serial Number [id number] H.Procedure: INTERSTIM IMPLANT FIRST STAGE;  Surgeon: Bjorn Loser, MD;  Location: [facility];  Service: Urology;  Laterality: N/A;  . INTERSTIM IMPLANT PLACEMENT N/A 08/11/2016   Procedure: Barrie Lyme IMPLANT SECOND STAGE IMPEDANCE CHECK;  Surgeon: Bjorn Loser, MD;  Location: [place];  Service: Urology;  Laterality: N/A;  . Venia Minks DILATION N/A 05/25/2016   Procedure: Venia Minks DILATION;  Surgeon: Daneil Dolin, MD;  [facility];  Service: Endoscopy;  Laterality: N/A;  . PARTIAL MASTECTOMY WITH AXILLARY SENTINEL LYMPH NODE BIOPSY Right 12/01/2006   and Right axilla node dissection x1  . PORT-A-CATH PLACEMENT AND REMOVAL  12-31-2006/  05-13-2007  . SUBMANDIBULAR GLAND EXCISION Left 03/05/2015   adenoid cystic carcinoma  . TRANSTHORACIC ECHOCARDIOGRAM  12/29/2006   normal echo, ef 55%  . TUBAL LIGATION  yrs ago  . VAGINAL HYSTERECTOMY  10-26-2007  dr Elonda Husky   w/ Anterior Repair with intraspinous graft and vagainal vault suspension    SOCIAL HISTORY: Social History   Socioeconomic History  . Marital status: Married    Spouse name: Not on file  . Number of children: 3  . Years of education: Not on file  . Highest education level: Not on file  Occupational History  . Not on file  Social Needs  . Financial resource strain: Not very hard  . Food insecurity    Worry: Never true    Inability: Never true  . Transportation needs    Medical: No    Non-medical: No  Tobacco Use  . Smoking status: Former Smoker    Packs/day: 1.50    Years: 25.00    Pack years: 37.50    Types: Cigarettes    Quit  date: 01/09/1996    Years since quitting: 23.5  . Smokeless tobacco: Never Used  Substance and Sexual Activity  . Alcohol use: No    Alcohol/week: 0.0 standard drinks    Comment: 03-23-2017 per pt no but in the past. stopped 1983  . Drug use: No    Comment: 03-23-17 per pt no but stopped in her 21s.  Marland Kitchen Sexual activity: Never  Lifestyle  . Physical activity    Days per week: 0 days    Minutes per session: 0 min  . Stress: To some extent  Relationships  . Social connections    Talks on phone: More than three times a week    Gets together: Twice a week    Attends religious service: 1 to 4 times per  year    Active member of club or organization: No    Attends meetings of clubs or organizations: Never    Relationship status: Married  . Intimate partner violence    Fear of current or ex partner: No    Emotionally abused: No    Physically abused: No    Forced sexual activity: No  Other Topics Concern  . Not on file  Social History Narrative  . Not on file    FAMILY HISTORY: Family History  Problem Relation Age of Onset  . Diabetes Mother   . Hypertension Mother   . Cancer Mother        unknown primary  . Bipolar disorder Mother   . Colon cancer Paternal Grandmother 21       deceased  . Diabetes Daughter 7       type 1  . Depression Sister   . Bipolar disorder Paternal Grandfather   . Depression Sister   . Liver disease Neg Hx     ALLERGIES:  is allergic to bee venom and penicillins.  MEDICATIONS:  Current Outpatient Medications  Medication Sig Dispense Refill  . ACCU-CHEK AVIVA PLUS test strip USE TO TEST BLOOD GLUCOSE TWO TO THREE TIMES A DAY AS DIRECTED 100 each 2  . ACCU-CHEK SOFTCLIX LANCETS lancets USE TO TEST BLOOD GLUCOSE THREE TIMES DAILY AS DIRECTED 100 each 2  . acetaminophen (TYLENOL) 500 MG tablet Take 500 mg by mouth as needed.    . ALPRAZolam (XANAX) 0.5 MG tablet TAKE 1 TABLET BY MOUTH TWICE DAILY AS NEEDED FOR ANXIETY 60 tablet 2  . aspirin (ASPIRIN  LOW DOSE) 81 MG EC tablet Take 1 tablet (81 mg total) by mouth daily. (Needs to be seen before next refill) 90 tablet 0  . buPROPion (WELLBUTRIN XL) 300 MG 24 hr tablet Take 1 tablet (300 mg total) by mouth daily. 90 tablet 1  . diclofenac sodium (VOLTAREN) 1 % GEL Apply 2 g topically 4 (four) times daily. 100 g 6  . FLUoxetine (PROZAC) 40 MG capsule TAKE ONE CAPSULE BY MOUTH DAILY 90 capsule 0  . fluticasone furoate-vilanterol (BREO ELLIPTA) 200-25 MCG/INH AEPB Inhale 1 puff into the lungs daily. 1 each 5  . loratadine (CLARITIN) 10 MG tablet TAKE 1 TABLET BY MOUTH EVERY DAY 90 tablet 1  . meloxicam (MOBIC) 15 MG tablet TAKE 1 TABLET BY MOUTH DAILY 30 tablet 3  . metFORMIN (GLUCOPHAGE) 1000 MG tablet TAKE 1 TABLET BY MOUTH TWICE DAILY 180 tablet 0  . omeprazole (PRILOSEC) 40 MG capsule TAKE ONE CAPSULE BY MOUTH IN THE EVENING 90 capsule 0  . oxybutynin (DITROPAN XL) 15 MG 24 hr tablet Take 15 mg by mouth at bedtime.    . pilocarpine (SALAGEN) 5 MG tablet TAKE TWO TABLETS BY MOUTH THREE TIMES DAILY (Patient taking differently: Take 10 mg by mouth 3 (three) times daily. ) 180 tablet 2  . pravastatin (PRAVACHOL) 40 MG tablet TAKE 1 TABLET BY MOUTH DAILY 90 tablet 1  . PROAIR HFA 108 (90 Base) MCG/ACT inhaler INHALE 2 PUFFS EVERY 4 TO 6 HOURS AS NEEDED 25.5 g 3  . tolterodine (DETROL LA) 4 MG 24 hr capsule Take 1 capsule (4 mg total) by mouth daily. 90 capsule 1  . traZODone (DESYREL) 100 MG tablet Take 2 tablets (200 mg total) by mouth at bedtime. 180 tablet 2   No current facility-administered medications for this visit.     REVIEW OF SYSTEMS:   Constitutional: Denies fevers, chills  or abnormal night sweats.  Positive for fatigue. Eyes: Denies blurriness of vision, double vision or watery eyes Ears, nose, mouth, throat, and face: Denies mucositis or sore throat Respiratory: Denies cough, dyspnea or wheezes Cardiovascular: Denies palpitation, chest discomfort or lower extremity  swelling Gastrointestinal: Positive for trouble swallowing and constipation.  Denies any nausea or vomiting. Skin: Denies abnormal skin rashes Lymphatics: Denies new lymphadenopathy or easy bruising Neurological:Denies numbness, tingling or new weaknesses Behavioral/Psych: Mood is stable, no new changes  All other systems were reviewed with the patient and are negative.  PHYSICAL EXAMINATION: ECOG PERFORMANCE STATUS: 1 - Symptomatic but completely ambulatory  Vitals:   08/02/19 1429  BP: 136/61  Pulse: 95  Resp: 18  Temp: 97.8 F (36.6 C)  SpO2: 95%   Filed Weights   08/02/19 1429  Weight: 223 lb 12.8 oz (101.5 kg)    GENERAL:alert, no distress and comfortable SKIN: skin color, texture, turgor are normal, no rashes or significant lesions EYES: normal, conjunctiva are pink and non-injected, sclera clear OROPHARYNX: No oropharyngeal lesions. NECK: supple, thyroid normal size, non-tender, without nodularity LYMPH:  no palpable lymphadenopathy in the cervical, axillary or inguinal LUNGS: clear to auscultation and percussion with normal breathing effort HEART: regular rate & rhythm and no murmurs and no lower extremity edema ABDOMEN:abdomen soft, non-tender and normal bowel sounds Musculoskeletal:no cyanosis of digits and no clubbing  PSYCH: alert & oriented x 3 with fluent speech NEURO: no focal motor/sensory deficits  LABORATORY DATA:  I have reviewed the data as listed Lab Results  Component Value Date   WBC 5.4 02/28/2019   HGB 11.7 02/28/2019   HCT 34.6 02/28/2019   MCV 85 02/28/2019   PLT 318 02/28/2019     Chemistry      Component Value Date/Time   NA 138 02/28/2019 1135   K 4.6 02/28/2019 1135   CL 98 02/28/2019 1135   CO2 21 02/28/2019 1135   BUN 14 02/28/2019 1135   CREATININE 0.98 02/28/2019 1135      Component Value Date/Time   CALCIUM 9.3 02/28/2019 1135   ALKPHOS 83 02/28/2019 1135   AST 14 02/28/2019 1135   ALT 13 02/28/2019 1135   BILITOT <0.2  02/28/2019 1135       RADIOGRAPHIC STUDIES: I have personally reviewed the radiological images as listed and agreed with the findings in the report.  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  She complains of dry mouth.  No difficulty swallowing. - Physical exam today did not reveal any palpable adenopathy.  No masses seen in the oropharynx. - She reported numbness sensation which goes from the left side of the neck into the left upper extremity.  C-spine x-rays on 02/28/2019 showed degenerative cervical spondylosis with multilevel disc disease and facet disease. -We will do a CT of the neck with contrast.  We will also check a TSH level as she had radiation done previously.  We will also consider referral for mirror and a fiberoptic examination.  2.  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  Pathology showed 2.1 cm IDC, margins negative, Ki-67 of 66%, ER/PR/HER-2 negative, PT 2 PN  0.  One sentinel lymph node was negative. - She underwent chemotherapy followed by radiation therapy. -Last mammogram on 06/20/2017 was BI-RADS 1.  Physical exam today did not reveal any palpable masses. -We will schedule her for another mammogram this year.  3.  Health maintenance: - Colonoscopy on 05/25/2016 shows normal colon. -EGD on the same day showed low-grade narrowings Schatzki ring.  This was dilated.  Small hiatal hernia.  Otherwise normal. - She smoked 1 and half pack per day for 25 years and quit in 1996.  She does not qualify for screening CT.  Orders Placed This Encounter  Procedures  . CT SOFT  TISSUE NECK W CONTRAST    Standing Status:   Future    Standing Expiration Date:   11/01/2020    Order Specific Question:   ** REASON FOR EXAM (FREE TEXT)    Answer:   Hx of submandibular cancer    Order Specific Question:   If indicated for the ordered procedure, I authorize the administration of contrast media per Radiology protocol    Answer:   Yes    Order Specific Question:   Preferred imaging location?    Answer:   [facility]    Order Specific Question:   Radiology Contrast Protocol - do NOT remove file path    Answer:   [email or web address]  . MM DIGITAL SCREENING BILATERAL    Standing Status:   Future    Standing Expiration Date:   10/01/2020    Order Specific Question:   Reason for Exam (SYMPTOM  OR DIAGNOSIS REQUIRED)    Answer:   screening, history of left breast cancer in 2008    Order Specific Question:   Preferred imaging location?    Answer:   [facility]  . CBC with Differential/Platelet    Standing Status:   Future    Standing Expiration Date:   08/01/2020  . Comprehensive metabolic panel    Standing Status:   Future    Standing Expiration Date:   08/01/2020  . TSH    Standing Status:   Future    Standing Expiration Date:   08/01/2020    All questions were answered. The patient knows to call the clinic with any problems, questions or concerns.      Derek Jack, MD 08/02/2019 3:29 PM

## 2019-08-03 ENCOUNTER — Encounter: Payer: Self-pay | Admitting: Family

## 2019-08-03 ENCOUNTER — Ambulatory Visit (INDEPENDENT_AMBULATORY_CARE_PROVIDER_SITE_OTHER): Payer: PRIVATE HEALTH INSURANCE | Admitting: Family

## 2019-08-03 DIAGNOSIS — J449 Chronic obstructive pulmonary disease, unspecified: Secondary | ICD-10-CM

## 2019-08-03 DIAGNOSIS — K219 Gastro-esophageal reflux disease without esophagitis: Secondary | ICD-10-CM | POA: Diagnosis not present

## 2019-08-03 DIAGNOSIS — E1169 Type 2 diabetes mellitus with other specified complication: Secondary | ICD-10-CM | POA: Diagnosis not present

## 2019-08-03 DIAGNOSIS — N3946 Mixed incontinence: Secondary | ICD-10-CM

## 2019-08-03 DIAGNOSIS — M1991 Primary osteoarthritis, unspecified site: Secondary | ICD-10-CM | POA: Diagnosis not present

## 2019-08-03 DIAGNOSIS — Z86010 Personal history of colonic polyps: Secondary | ICD-10-CM

## 2019-08-03 DIAGNOSIS — F331 Major depressive disorder, recurrent, moderate: Secondary | ICD-10-CM

## 2019-08-03 DIAGNOSIS — R682 Dry mouth, unspecified: Secondary | ICD-10-CM

## 2019-08-03 DIAGNOSIS — Z79899 Other long term (current) drug therapy: Secondary | ICD-10-CM

## 2019-08-03 DIAGNOSIS — F411 Generalized anxiety disorder: Secondary | ICD-10-CM

## 2019-08-03 DIAGNOSIS — Z85819 Personal history of malignant neoplasm of unspecified site of lip, oral cavity, and pharynx: Secondary | ICD-10-CM

## 2019-08-03 DIAGNOSIS — E119 Type 2 diabetes mellitus without complications: Secondary | ICD-10-CM

## 2019-08-03 DIAGNOSIS — E785 Hyperlipidemia, unspecified: Secondary | ICD-10-CM

## 2019-08-03 MED ORDER — METFORMIN HCL 1000 MG PO TABS: 1000.0000 mg | ORAL_TABLET | Freq: Two times a day (BID) | ORAL | 2 refills | Status: DC

## 2019-08-03 MED ORDER — PILOCARPINE HCL 5 MG PO TABS: 10.0000 mg | ORAL_TABLET | Freq: Three times a day (TID) | ORAL | 2 refills | Status: AC

## 2019-08-03 MED ORDER — ALPRAZOLAM 0.5 MG PO TABS: ORAL_TABLET | ORAL | 2 refills | Status: DC

## 2019-08-03 MED ORDER — MIRABEGRON ER 50 MG PO TB24: 50.0000 mg | ORAL_TABLET | Freq: Every day | ORAL | 1 refills | Status: DC

## 2019-08-03 NOTE — Progress Notes (Signed)
Virtual Visit via telephone Note Due to COVID-19 pandemic this visit was conducted virtually. This visit type was conducted due to national recommendations for restrictions regarding the COVID-19 Pandemic (e.g. social distancing, sheltering in place) in an effort to limit this patient's exposure and mitigate transmission in our community. All issues noted in this document were discussed and addressed.  A physical exam was not performed with this format.  I connected with [name] on 08/03/19 at 1:04 pm by telephone and verified that I am speaking with the correct person using two identifiers. [name] is currently located at home and no one is currently with her during visit. The provider, Evelina Dun, FNP is located in their office at time of visit.  I discussed the limitations, risks, security and privacy concerns of performing an evaluation and management service by telephone and the availability of in person appointments. I also discussed with the patient that there may be a patient responsible charge related to this service. The patient expressed understanding and agreed to proceed.   History and Present Illness:  PT presents to the office today for chronic follow up.   Pt history of Left submandibular cancerandOncologists and wasfollowed by them annually.PT had been followed by Urologists for urinary incontinence. Diabetes She presents for her follow-up diabetic visit. She has type 2 diabetes mellitus. Her disease course has been stable. Hypoglycemia symptoms include nervousness/anxiousness. Associated symptoms include fatigue. Symptoms are stable. Pertinent negatives for diabetic complications include no CVA or heart disease. Risk factors for coronary artery disease include dyslipidemia, diabetes mellitus, hypertension, sedentary lifestyle and post-menopausal. (Does not check her blood sugars at home) Eye exam is not current.  Gastroesophageal Reflux She complains  of heartburn. She reports no belching or no coughing. This is a chronic problem. The current episode started more than 1 year ago. The problem occurs occasionally. The symptoms are aggravated by certain foods. Associated symptoms include fatigue. She has tried a PPI for the symptoms. The treatment provided moderate relief.  Hyperlipidemia This is a chronic problem. The current episode started more than 1 year ago. The problem is controlled. Recent lipid tests were reviewed and are normal. Exacerbating diseases include obesity. Current antihyperlipidemic treatment includes statins. The current treatment provides moderate improvement of lipids.  Arthritis Presents for follow-up visit. She complains of pain and stiffness. Affected locations include the neck (back). Her pain is at a severity of 9/10. Associated symptoms include fatigue.  Depression        This is a chronic problem.  The current episode started more than 1 year ago.   The onset quality is gradual.   The problem occurs intermittently.  The problem has been waxing and waning since onset.  Associated symptoms include decreased concentration, fatigue, helplessness, hopelessness, insomnia, irritable, restlessness and sad.  Past medical history includes anxiety.   Anxiety Presents for follow-up visit. Symptoms include decreased concentration, depressed mood, excessive worry, insomnia, irritability, nervous/anxious behavior and restlessness. Symptoms occur constantly.    COPD PT using Breo. Has SOB at times.  Urinary Incontinence PT currently taking Detrol La 4 mg. She states she continues to have incontinence and having to pee about 3-4 times a night.      Review of Systems  Constitutional: Positive for fatigue and irritability.  Respiratory: Negative for cough.   Gastrointestinal: Positive for heartburn.  Musculoskeletal: Positive for arthritis and stiffness.  Psychiatric/Behavioral: Positive for decreased concentration and depression.  The patient is nervous/anxious and has insomnia.  Observations/Objective: No SOB or distress noted, pt anxious   Assessment and Plan: 1. Chronic obstructive pulmonary disease, unspecified COPD type ([place])  2. Gastroesophageal reflux disease, esophagitis presence not specified  3. Hyperlipidemia associated with type 2 diabetes mellitus ([place])  4. Type 2 diabetes mellitus with other specified complication, without long-term current use of insulin ([place])  5. Primary osteoarthritis, unspecified site  6. Mixed incontinence urge and stress - mirabegron ER ([name]) 50 MG TB24 tablet; Take 1 tablet (50 mg total) by mouth daily.  Dispense: 90 tablet; Refill: 1 - Ambulatory referral to Urology  7. Major depressive disorder, recurrent episode, moderate (HCC)   8. Hx of adenomatous colonic polyps   9. H/O malignant neoplasm of submandibular gland  10. Controlled substance agreement signed   11. GAD (generalized anxiety disorder) - ALPRAZolam (XANAX) 0.5 MG tablet; TAKE 1 TABLET BY MOUTH TWICE DAILY AS NEEDED FOR ANXIETY  Dispense: 60 tablet; Refill: 2  12. Mouth dryness  - pilocarpine (SALAGEN) 5 MG tablet; Take 2 tablets (10 mg total) by mouth 3 (three) times daily.  Dispense: 540 tablet; Refill: 2  13. Type 2 diabetes mellitus without complication, without long-term current use of insulin (HCC) - metFORMIN (GLUCOPHAGE) 1000 MG tablet; Take 1 tablet (1,000 mg total) by mouth 2 (two) times daily.  Dispense: 180 tablet; Refill: 2  Labs reviewed  Continue medications  Keep follow up with Oncologists Follow up in 3 months    I discussed the assessment and treatment plan with the patient. The patient was provided an opportunity to ask questions and all were answered. The patient agreed with the plan and demonstrated an understanding of the instructions.   The patient was advised to call back or seek an in-person evaluation if the symptoms worsen or if the condition fails to  improve as anticipated.  The above assessment and management plan was discussed with the patient. The patient verbalized understanding of and has agreed to the management plan. Patient is aware to call the clinic if symptoms persist or worsen. Patient is aware when to return to the clinic for a follow-up visit. Patient educated on when it is appropriate to go to the emergency department.   Time call ended:  1:35 pm  I provided 31 minutes of non-face-to-face time during this encounter.    Evelina Dun, FNP

## 2019-08-04 ENCOUNTER — Telehealth: Payer: Self-pay | Admitting: *Deleted

## 2019-08-04 NOTE — Telephone Encounter (Signed)
Prior Auth for [employer] 50mg  ER tablets-In Process  Key: [id number]    Your request has been successfully sent to [place] for review. You may close this dialog, return to your dashboard, and perform other tasks.  To check for an update later, open this request again from your dashboard.  Your request will be reviewed within 24 hours. If needed, you may contact [place] at [phone].

## 2019-08-07 ENCOUNTER — Ambulatory Visit (HOSPITAL_COMMUNITY)
Admission: RE | Admit: 2019-08-07 | Discharge: 2019-08-07 | Disposition: A | Discharge status: Home / Self Care | Payer: PRIVATE HEALTH INSURANCE | Source: Ambulatory Visit | Attending: Hematology | Admitting: Hematology

## 2019-08-07 ENCOUNTER — Other Ambulatory Visit: Payer: Self-pay

## 2019-08-07 ENCOUNTER — Other Ambulatory Visit: Payer: Self-pay | Admitting: Family

## 2019-08-07 DIAGNOSIS — C080 Malignant neoplasm of submandibular gland: Secondary | ICD-10-CM | POA: Insufficient documentation

## 2019-08-07 DIAGNOSIS — Z1239 Encounter for other screening for malignant neoplasm of breast: Secondary | ICD-10-CM | POA: Diagnosis present

## 2019-08-07 DIAGNOSIS — G4700 Insomnia, unspecified: Secondary | ICD-10-CM

## 2019-08-07 NOTE — Telephone Encounter (Signed)
Prior Auth for [name] 50mg  ER tab-APPROVED for 365 days  PA# [id number]

## 2019-08-08 ENCOUNTER — Other Ambulatory Visit: Payer: Self-pay | Admitting: Family

## 2019-08-08 ENCOUNTER — Ambulatory Visit (HOSPITAL_COMMUNITY)
Admission: RE | Admit: 2019-08-08 | Discharge: 2019-08-08 | Disposition: A | Discharge status: Home / Self Care | Payer: PRIVATE HEALTH INSURANCE | Source: Ambulatory Visit | Attending: Hematology | Admitting: Hematology

## 2019-08-08 DIAGNOSIS — C080 Malignant neoplasm of submandibular gland: Secondary | ICD-10-CM | POA: Diagnosis not present

## 2019-08-08 DIAGNOSIS — K219 Gastro-esophageal reflux disease without esophagitis: Secondary | ICD-10-CM

## 2019-08-08 MED ORDER — IOHEXOL 300 MG/ML  SOLN
75.0000 mL | Freq: Once | INTRAMUSCULAR | Status: AC | PRN
  Administered 2019-08-08: 75 mL via INTRAVENOUS

## 2019-08-10 ENCOUNTER — Other Ambulatory Visit: Payer: Self-pay

## 2019-08-10 ENCOUNTER — Encounter (HOSPITAL_COMMUNITY): Payer: Self-pay | Admitting: Hematology

## 2019-08-10 ENCOUNTER — Inpatient Hospital Stay (HOSPITAL_BASED_OUTPATIENT_CLINIC_OR_DEPARTMENT_OTHER): Payer: PRIVATE HEALTH INSURANCE | Admitting: Hematology

## 2019-08-10 VITALS — BP 128/68 | HR 95 | Temp 97.8°F | Resp 18 | Wt 223.1 lb

## 2019-08-10 DIAGNOSIS — Z853 Personal history of malignant neoplasm of breast: Secondary | ICD-10-CM

## 2019-08-10 DIAGNOSIS — C080 Malignant neoplasm of submandibular gland: Secondary | ICD-10-CM | POA: Diagnosis not present

## 2019-08-10 DIAGNOSIS — Z85819 Personal history of malignant neoplasm of unspecified site of lip, oral cavity, and pharynx: Secondary | ICD-10-CM | POA: Diagnosis not present

## 2019-08-10 DIAGNOSIS — Z1239 Encounter for other screening for malignant neoplasm of breast: Secondary | ICD-10-CM | POA: Diagnosis not present

## 2019-08-10 NOTE — Patient Instructions (Signed)
[facility] at [facility] Discharge Instructions  Follow up in 6 months with labs    Thank you for choosing [facility] at [facility] to provide your oncology and hematology care.  To afford each patient quality time with our provider, please arrive at least 15 minutes before your scheduled appointment time.   If you have a lab appointment with the Cancer Center please come in thru the  Main Entrance and check in at the main information desk  You need to re-schedule your appointment should you arrive 10 or more minutes late.  We strive to give you quality time with our providers, and arriving late affects you and other patients whose appointments are after yours.  Also, if you no show three or more times for appointments you may be dismissed from the clinic at the providers discretion.     Again, thank you for choosing [facility].  Our hope is that these requests will decrease the amount of time that you wait before being seen by our physicians.       _____________________________________________________________  Should you have questions after your visit to  Cancer Center, please contact our office at [phone] between the hours of 8:00 a.m. and 4:30 p.m.  Voicemails left after 4:00 p.m. will not be returned until the following business day.  For prescription refill requests, have your pharmacy contact our office and allow 72 hours.    Cancer Center Support Programs:   > Cancer Support Group  2nd Tuesday of the month 1pm-2pm, Journey Room    

## 2019-08-10 NOTE — Progress Notes (Signed)
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  CURRENT THERAPY: observation   INTERVAL HISTORY:  [name] 63 y.o. 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 She reports she has been doing well since her last visit. Denies any nausea, vomiting, or diarrhea. Denies any new pains. Had not noticed any recent bleeding such as epistaxis, hematuria or hematochezia. Denies recent chest pain on exertion, shortness of breath on minimal exertion, pre-syncopal episodes, or palpitations. Denies any numbness or tingling in hands or feet. Denies any recent fevers, infections, or recent hospitalizations. Patient reports appetite at 75% and energy level at 25%. She is eating well and maintaining her weight at this time.     REVIEW OF SYSTEMS:  Review of Systems  Gastrointestinal: Positive for constipation.  Neurological: Positive for dizziness and headaches.  All other systems reviewed and are negative.    PAST MEDICAL/SURGICAL HISTORY:  Past Medical History:  Diagnosis Date  . Adenoid cystic carcinoma of submandibular gland [facility]) dx 2016---  oncologist-  dr Eppie Gibson [facility])   Left submandibular salivary gland--  T1 N0 M0,  Grade 2 w/ perineural invasion  s/p  resection 03-05-2015  and Radiation therapy 04-29-2015 to 06-11-2015  . Allergic rhinitis   . Anemia   . Arthritis   . Bipolar disorder ([place])    [facility]  . Breast cancer ([place])   . COPD (chronic obstructive pulmonary disease) ([place])   . Cystocele   . Decreased sense of taste    secondary to radiation  . GAD (generalized anxiety disorder)   . GERD (gastroesophageal reflux disease)   . Headache   . Hemorrhoid     INTERNAL AND EXTERNAL    . History of adenomatous polyp of colon   . History of breast cancer dx 2007--- oncologist-- dr Lollie Marrow--- no recurrence   Right breast DCIS ,  Stage 2 (T2 N0 M0) --  s/p  right partial mastecotmy w/ sln dissection,  chemotherapy complete 02-01-2007,  radiation completed 06-15-2007  . History of esophageal dilatation   . History of esophagitis   . History of external beam radiation therapy    04-27-2007 to 06-15-2007 , right breast- 5040cGy in 28 sessions and boost 1200 cGy in 6 sessions/   04-29-2015 to 06-11-2015 , left neck and skull base -- 60 Gy in 30 fractions  . History of hiatal hernia   . History of panic attacks   . History of perforation of tympanic membrane    AFTER ACUTE OTITIS MEDIA 2016-- RESOLVED  . Hypertriglyceridemia    ?  Marland Kitchen Loss of teeth due to extraction    secondary to radiation therapy---  x8 or 9  . Mouth dryness    secondary to radiation  . Neck pain    resuidaul neck pain from radiation  . Peripheral neuropathy   . Personal history of chemotherapy   . Personal history of radiation therapy   . TMJ (temporomandibular joint disorder)   . Type 2 diabetes mellitus ([place])   . Urge and stress incontinence   . Wears glasses    Past Surgical History:  Procedure Laterality Date  . COLONOSCOPY  03/30/2012   RMR: rectal and colonic polyps -removed as described above. Mellanosis  coli  . COLONOSCOPY WITH PROPOFOL N/A 05/25/2016   Procedure: COLONOSCOPY WITH PROPOFOL;  Surgeon: Daneil Dolin, MD;  [facility];  Service: Endoscopy;  Laterality: N/A;  1400   . ECTOPIC PREGNANCY SURGERY    . ESOPHAGOGASTRODUODENOSCOPY (EGD) WITH PROPOFOL N/A 05/25/2016   Rourk: small hh, status post dilation of Schatzki ring, normal colon. Next colonoscopy June 2022  . EXCISION / BIOPSY BREAST / NIPPLE / DUCT    . INTERSTIM IMPLANT PLACEMENT N/A 08/11/2016   Serial Number [id number] H.Procedure: INTERSTIM IMPLANT FIRST STAGE;  Surgeon: Bjorn Loser, MD;  Location: [facility];  Service: Urology;  Laterality: N/A;  . INTERSTIM IMPLANT PLACEMENT N/A 08/11/2016   Procedure: Barrie Lyme IMPLANT SECOND STAGE IMPEDANCE CHECK;  Surgeon: Bjorn Loser, MD;  Location: [place];  Service: Urology;  Laterality: N/A;  . Venia Minks DILATION N/A 05/25/2016   Procedure: Venia Minks DILATION;  Surgeon: Daneil Dolin, MD;  [facility];  Service: Endoscopy;  Laterality: N/A;  . PARTIAL MASTECTOMY WITH AXILLARY SENTINEL LYMPH NODE BIOPSY Right 12/01/2006   and Right axilla node dissection x1  . PORT-A-CATH PLACEMENT AND REMOVAL  12-31-2006/  05-13-2007  . SUBMANDIBULAR GLAND EXCISION Left 03/05/2015   adenoid cystic carcinoma  . TRANSTHORACIC ECHOCARDIOGRAM  12/29/2006   normal echo, ef 55%  . TUBAL LIGATION  yrs ago  . VAGINAL HYSTERECTOMY  10-26-2007  dr Elonda Husky   w/ Anterior Repair with intraspinous graft and vagainal vault suspension     SOCIAL HISTORY:  Social History   Socioeconomic History  . Marital status: Married    Spouse name: Not on file  . Number of children: 3  . Years of education: Not on file  . Highest education level: Not on file  Occupational History  . Not on file  Social Needs  . Financial resource strain: Not very hard  . Food insecurity    Worry: Never true    Inability: Never true  . Transportation needs    Medical: No    Non-medical: No  Tobacco Use  . Smoking status: Former Smoker    Packs/day: 1.50    Years: 25.00    Pack years: 37.50    Types: Cigarettes    Quit date: 01/09/1996    Years since quitting: 23.6  . Smokeless tobacco: Never Used  Substance and Sexual Activity  . Alcohol use: No    Alcohol/week: 0.0 standard drinks    Comment: 03-23-2017 per pt no but in the past. stopped 1983  . Drug use: No    Comment: 03-23-17 per pt no but stopped in her 82s.  Marland Kitchen Sexual activity: Never  Lifestyle  . Physical activity    Days per week: 0 days    Minutes per  session: 0 min  . Stress: To some extent  Relationships  . Social connections    Talks on phone: More than three times a week    Gets together: Twice a week    Attends religious service: 1 to 4 times per year    Active member of club or organization: No    Attends meetings of clubs or organizations: Never    Relationship status: Married  . Intimate partner violence    Fear of current or ex partner: No    Emotionally abused: No    Physically abused: No    Forced sexual activity: No  Other Topics Concern  . Not on file  Social History Narrative  . Not on file  FAMILY HISTORY:  Family History  Problem Relation Age of Onset  . Diabetes Mother   . Hypertension Mother   . Cancer Mother        unknown primary  . Bipolar disorder Mother   . Colon cancer Paternal Grandmother 54       deceased  . Diabetes Daughter 7       type 1  . Depression Sister   . Bipolar disorder Paternal Grandfather   . Depression Sister   . Liver disease Neg Hx     CURRENT MEDICATIONS:  Outpatient Encounter Medications as of 08/10/2019  Medication Sig  . ACCU-CHEK AVIVA PLUS test strip USE TO TEST BLOOD GLUCOSE TWO TO THREE TIMES A DAY AS DIRECTED  . ACCU-CHEK SOFTCLIX LANCETS lancets USE TO TEST BLOOD GLUCOSE THREE TIMES DAILY AS DIRECTED  . acetaminophen (TYLENOL) 500 MG tablet Take 500 mg by mouth as needed.  . ALPRAZolam (XANAX) 0.5 MG tablet TAKE 1 TABLET BY MOUTH TWICE DAILY AS NEEDED FOR ANXIETY  . aspirin (ASPIRIN LOW DOSE) 81 MG EC tablet Take 1 tablet (81 mg total) by mouth daily. (Needs to be seen before next refill)  . buPROPion (WELLBUTRIN XL) 300 MG 24 hr tablet Take 1 tablet (300 mg total) by mouth daily.  . diclofenac sodium (VOLTAREN) 1 % GEL Apply 2 g topically 4 (four) times daily.  Marland Kitchen FLUoxetine (PROZAC) 40 MG capsule TAKE ONE CAPSULE BY MOUTH DAILY  . fluticasone furoate-vilanterol (BREO ELLIPTA) 200-25 MCG/INH AEPB Inhale 1 puff into the lungs daily.  Marland Kitchen loratadine (CLARITIN) 10  MG tablet TAKE 1 TABLET BY MOUTH EVERY DAY  . meloxicam (MOBIC) 15 MG tablet TAKE 1 TABLET BY MOUTH DAILY  . metFORMIN (GLUCOPHAGE) 1000 MG tablet Take 1 tablet (1,000 mg total) by mouth 2 (two) times daily.  . mirabegron ER ([name]) 50 MG TB24 tablet Take 1 tablet (50 mg total) by mouth daily.  Marland Kitchen omeprazole (PRILOSEC) 40 MG capsule TAKE ONE CAPSULE BY MOUTH IN THE EVENING  . pilocarpine (SALAGEN) 5 MG tablet Take 2 tablets (10 mg total) by mouth 3 (three) times daily.  . pravastatin (PRAVACHOL) 40 MG tablet TAKE 1 TABLET BY MOUTH DAILY  . PROAIR HFA 108 (90 Base) MCG/ACT inhaler INHALE 2 PUFFS EVERY 4 TO 6 HOURS AS NEEDED  . traZODone (DESYREL) 100 MG tablet TAKE TWO TABLETS BY MOUTH AT BEDTIME  . [DISCONTINUED] oxybutynin (DITROPAN XL) 15 MG 24 hr tablet Take 15 mg by mouth at bedtime.   No facility-administered encounter medications on file as of 08/10/2019.     ALLERGIES:  Allergies  Allergen Reactions  . Bee Venom Hives, Swelling and Other (See Comments)    Bee stings  Bee stings Bee stings   . Penicillins Anaphylaxis and Swelling    Has patient had a PCN reaction causing immediate rash, facial/tongue/throat swelling, SOB or lightheadedness with hypotension: Yes Has patient had a PCN reaction causing severe rash involving mucus membranes or skin necrosis: No Has patient had a PCN reaction that required hospitalization Yes Has patient had a PCN reaction occurring within the last 10 years: No If all of the above answers are "NO", then may proceed with Cephalosporin use.      PHYSICAL EXAM:  ECOG Performance status: 1  Vitals:   08/10/19 1435  BP: 128/68  Pulse: 95  Resp: 18  Temp: 97.8 F (36.6 C)  SpO2: 98%   Filed Weights   08/10/19 1435  Weight: 223 lb 1.6 oz (101.2 kg)  Physical Exam Constitutional:      Appearance: Normal appearance. She is normal weight.  Cardiovascular:     Rate and Rhythm: Normal rate and regular rhythm.     Heart sounds: Normal  heart sounds.  Pulmonary:     Effort: Pulmonary effort is normal.     Breath sounds: Normal breath sounds.  Abdominal:     General: Bowel sounds are normal.     Palpations: Abdomen is soft.  Musculoskeletal: Normal range of motion.  Skin:    General: Skin is warm and dry.  Neurological:     Mental Status: She is alert and oriented to person, place, and time. Mental status is at baseline.  Psychiatric:        Mood and Affect: Mood normal.        Behavior: Behavior normal.        Thought Content: Thought content normal.        Judgment: Judgment normal.      LABORATORY DATA:  I have reviewed the labs as listed.  CBC    Component Value Date/Time   WBC 5.1 08/02/2019 1542   RBC 4.17 08/02/2019 1542   HGB 11.9 (L) 08/02/2019 1542   HGB 11.7 02/28/2019 1135   HCT 38.0 08/02/2019 1542   HCT 34.6 02/28/2019 1135   PLT 314 08/02/2019 1542   PLT 318 02/28/2019 1135   MCV 91.1 08/02/2019 1542   MCV 85 02/28/2019 1135   MCH 28.5 08/02/2019 1542   MCHC 31.3 08/02/2019 1542   RDW 13.6 08/02/2019 1542   RDW 13.2 02/28/2019 1135   LYMPHSABS 1.6 08/02/2019 1542   LYMPHSABS 1.2 02/28/2019 1135   MONOABS 0.4 08/02/2019 1542   EOSABS 0.2 08/02/2019 1542   EOSABS 0.3 02/28/2019 1135   BASOSABS 0.1 08/02/2019 1542   BASOSABS 0.0 02/28/2019 1135   CMP Latest Ref Rng & Units 08/02/2019 02/28/2019 09/05/2018  Glucose 70 - 99 mg/dL 135(H) 181(H) 208(H)  BUN 8 - 23 mg/dL '17 14 10  ' Creatinine 0.44 - 1.00 mg/dL 0.96 0.98 0.99  Sodium 135 - 145 mmol/L 136 138 139  Potassium 3.5 - 5.1 mmol/L 4.7 4.6 5.0  Chloride 98 - 111 mmol/L 101 98 99  CO2 22 - 32 mmol/L '27 21 26  ' Calcium 8.9 - 10.3 mg/dL 9.3 9.3 9.6  Total Protein 6.5 - 8.1 g/dL 7.1 6.5 6.4  Total Bilirubin 0.3 - 1.2 mg/dL 0.4 <0.2 <0.2  Alkaline Phos 38 - 126 U/L 64 83 89  AST 15 - 41 U/L '15 14 15  ' ALT 0 - 44 U/L '15 13 14       ' DIAGNOSTIC IMAGING:  I have independently reviewed the scans and discussed with the patient.   I  have reviewed Francene Finders, NP's note and agree with the documentation.  I personally performed a face-to-face visit, made revisions and my assessment and plan is as follows.    ASSESSMENT & PLAN:   H/O malignant neoplasm of submandibular gland 1.  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  She complains of dry mouth.  No difficulty swallowing. - Physical exam today did not reveal any palpable adenopathy.  No masses seen in the oropharynx. - She reported numbness sensation which goes from the left side of the neck into the left upper  extremity.  C-spine x-rays on 02/28/2019 showed degenerative cervical spondylosis with multilevel disc disease and facet disease. - We have reviewed results of the CT scan of the neck on 08/08/2019 which did not show any evidence of recurrence or metastatic disease. -TSH was 1.2 and normal.  We will plan to see her back in 6 months with repeat TSH level. -She needs a repeat CT of the neck in 1 year.  2.  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  Pathology showed 2.1 cm IDC, margins negative, Ki-67 of 66%, ER/PR/HER-2 negative, PT 2 PN 0.  One sentinel lymph node was negative. - She underwent chemotherapy followed by radiation therapy. -Physical exam did not reveal any palpable masses.  We discussed results of the mammogram dated 08/07/2019 which was BI-RADS Category 1.  3.  Health maintenance: - Colonoscopy on 05/25/2016 shows normal colon. -EGD on the same day showed low-grade narrowings Schatzki ring.  This was dilated.  Small hiatal hernia.  Otherwise normal. - She smoked 1 and half pack per day for 25 years and quit in 1996.  She does  not qualify for screening CT.      Orders placed this encounter:  Orders Placed This Encounter  Procedures  . TSH  . CBC with Differential/Platelet  . Comprehensive metabolic panel      Derek Jack, MD [facility] [phone]

## 2019-08-11 ENCOUNTER — Other Ambulatory Visit: Payer: Self-pay | Admitting: Family

## 2019-08-13 NOTE — Assessment & Plan Note (Signed)
1.  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  She complains of dry mouth.  No difficulty swallowing. - Physical exam today did not reveal any palpable adenopathy.  No masses seen in the oropharynx. - She reported numbness sensation which goes from the left side of the neck into the left upper extremity.  C-spine x-rays on 02/28/2019 showed degenerative cervical spondylosis with multilevel disc disease and facet disease. - We have reviewed results of the CT scan of the neck on 08/08/2019 which did not show any evidence of recurrence or metastatic disease. -TSH was 1.2 and normal.  We will plan to see her back in 6 months with repeat TSH level. -She needs a repeat CT of the neck in 1 year.  2.  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  Pathology showed 2.1 cm IDC, margins negative, Ki-67 of 66%, ER/PR/HER-2 negative, PT 2 PN 0.  One sentinel lymph node was negative. - She underwent chemotherapy followed by radiation therapy. -Physical exam did not reveal any palpable masses.  We discussed results of the mammogram dated 08/07/2019 which was BI-RADS Category 1.  3.  Health maintenance: - Colonoscopy on 05/25/2016 shows normal colon. -EGD on the same day showed low-grade narrowings Schatzki ring.  This was dilated.  Small hiatal hernia.  Otherwise normal. - She smoked 1 and half pack per day for 25 years and quit in 1996.  She does not qualify for screening CT.

## 2019-09-18 ENCOUNTER — Other Ambulatory Visit: Payer: Self-pay | Admitting: Family

## 2019-09-18 DIAGNOSIS — F411 Generalized anxiety disorder: Secondary | ICD-10-CM

## 2019-09-18 DIAGNOSIS — E1169 Type 2 diabetes mellitus with other specified complication: Secondary | ICD-10-CM

## 2019-09-18 DIAGNOSIS — F331 Major depressive disorder, recurrent, moderate: Secondary | ICD-10-CM

## 2019-09-18 DIAGNOSIS — E785 Hyperlipidemia, unspecified: Secondary | ICD-10-CM

## 2019-09-26 ENCOUNTER — Ambulatory Visit (INDEPENDENT_AMBULATORY_CARE_PROVIDER_SITE_OTHER): Payer: PRIVATE HEALTH INSURANCE | Admitting: Family Medicine

## 2019-09-26 DIAGNOSIS — J3489 Other specified disorders of nose and nasal sinuses: Secondary | ICD-10-CM

## 2019-09-26 DIAGNOSIS — H9203 Otalgia, bilateral: Secondary | ICD-10-CM

## 2019-09-26 DIAGNOSIS — N3281 Overactive bladder: Secondary | ICD-10-CM

## 2019-09-26 DIAGNOSIS — G8929 Other chronic pain: Secondary | ICD-10-CM

## 2019-09-26 DIAGNOSIS — M26623 Arthralgia of bilateral temporomandibular joint: Secondary | ICD-10-CM

## 2019-09-26 DIAGNOSIS — N3946 Mixed incontinence: Secondary | ICD-10-CM

## 2019-09-26 MED ORDER — MUPIROCIN 2 % EX OINT: 1.0000 "application " | TOPICAL_OINTMENT | Freq: Two times a day (BID) | CUTANEOUS | 0 refills | Status: AC

## 2019-09-26 NOTE — Progress Notes (Signed)
Telephone visit  Subjective: CC: UTI PCP: Sharion Balloon, FNP [id number] [name] is a 63 y.o. female calls for telephone consult today. Patient provides verbal consent for consult held via phone.  Location of patient: home Location of provider: Working remotely from home Others present for call: husband  1. Urinary symptoms She has urinary frequency and urgency.  She has urinary incontinence.  Patient does report a h/o frequent or recurrent UTIs for years.  She used to see Dr McDiarmid at [facility].  Her insurance will no longer approve this clinic.  2. Nose sore She reports having scraped the inside of her right nose about 3 weeks ago.  She has been using saline and vaseline with little improvement.  Denies any significant redness externally.  No drainage.  No fevers  3. Ear pain She is had bilateral ear pain for the last couple of months.  She does note history of TMJ.  Denies any drainage from the ears, fevers.  She is had balance problems for quite some time and is currently being evaluated by her PCP.  Does not report any changes in her hearing.  She wants to see an ENT.  ROS: Per HPI  Allergies  Allergen Reactions  . Bee Venom Hives, Swelling and Other (See Comments)    Bee stings  Bee stings Bee stings   . Penicillins Anaphylaxis and Swelling    Has patient had a PCN reaction causing immediate rash, facial/tongue/throat swelling, SOB or lightheadedness with hypotension: Yes Has patient had a PCN reaction causing severe rash involving mucus membranes or skin necrosis: No Has patient had a PCN reaction that required hospitalization Yes Has patient had a PCN reaction occurring within the last 10 years: No If all of the above answers are "NO", then may proceed with Cephalosporin use.    Past Medical History:  Diagnosis Date  . Adenoid cystic carcinoma of submandibular gland [facility]) dx 2016---  oncologist-  dr Eppie Gibson [facility])   Left submandibular  salivary gland--  T1 N0 M0,  Grade 2 w/ perineural invasion  s/p  resection 03-05-2015  and Radiation therapy 04-29-2015 to 06-11-2015  . Allergic rhinitis   . Anemia   . Arthritis   . Bipolar disorder ([place])    [facility]  . Breast cancer ([place])   . COPD (chronic obstructive pulmonary disease) ([place])   . Cystocele   . Decreased sense of taste    secondary to radiation  . GAD (generalized anxiety disorder)   . GERD (gastroesophageal reflux disease)   . Headache   . Hemorrhoid    INTERNAL AND EXTERNAL    . History of adenomatous polyp of colon   . History of breast cancer dx 2007--- oncologist-- dr Lollie Marrow--- no recurrence   Right breast DCIS ,  Stage 2 (T2 N0 M0) --  s/p  right partial mastecotmy w/ sln dissection,  chemotherapy complete 02-01-2007,  radiation completed 06-15-2007  . History of esophageal dilatation   . History of esophagitis   . History of external beam radiation therapy    04-27-2007 to 06-15-2007 , right breast- 5040cGy in 28 sessions and boost 1200 cGy in 6 sessions/   04-29-2015 to 06-11-2015 , left neck and skull base -- 60 Gy in 30 fractions  . History of hiatal hernia   . History of panic attacks   . History of perforation of tympanic membrane    AFTER ACUTE OTITIS MEDIA 2016-- RESOLVED  . Hypertriglyceridemia    ?  Marland Kitchen  Loss of teeth due to extraction    secondary to radiation therapy---  x8 or 9  . Mouth dryness    secondary to radiation  . Neck pain    resuidaul neck pain from radiation  . Peripheral neuropathy   . Personal history of chemotherapy   . Personal history of radiation therapy   . TMJ (temporomandibular joint disorder)   . Type 2 diabetes mellitus ([place])   . Urge and stress incontinence   . Wears glasses     Current Outpatient Medications:  .  ACCU-CHEK AVIVA PLUS test strip, USE TO TEST BLOOD GLUCOSE TWO TO THREE TIMES A DAY AS DIRECTED, Disp: 100 each, Rfl: 2 .  ACCU-CHEK SOFTCLIX LANCETS lancets, USE TO TEST BLOOD GLUCOSE THREE TIMES  DAILY AS DIRECTED, Disp: 100 each, Rfl: 2 .  acetaminophen (TYLENOL) 500 MG tablet, Take 500 mg by mouth as needed., Disp: , Rfl:  .  ALPRAZolam (XANAX) 0.5 MG tablet, TAKE 1 TABLET BY MOUTH TWICE DAILY AS NEEDED FOR ANXIETY, Disp: 60 tablet, Rfl: 2 .  aspirin (ASPIRIN LOW DOSE) 81 MG EC tablet, Take 1 tablet (81 mg total) by mouth daily. (Needs to be seen before next refill), Disp: 90 tablet, Rfl: 0 .  buPROPion (WELLBUTRIN XL) 300 MG 24 hr tablet, Take 1 tablet (300 mg total) by mouth daily., Disp: 90 tablet, Rfl: 1 .  diclofenac sodium (VOLTAREN) 1 % GEL, Apply 2 g topically 4 (four) times daily., Disp: 100 g, Rfl: 6 .  FLUoxetine (PROZAC) 40 MG capsule, TAKE ONE CAPSULE BY MOUTH DAILY, Disp: 90 capsule, Rfl: 0 .  fluticasone furoate-vilanterol (BREO ELLIPTA) 200-25 MCG/INH AEPB, Inhale 1 puff into the lungs daily., Disp: 1 each, Rfl: 5 .  loratadine (CLARITIN) 10 MG tablet, TAKE 1 TABLET BY MOUTH EVERY DAY, Disp: 90 tablet, Rfl: 1 .  meloxicam (MOBIC) 15 MG tablet, TAKE 1 TABLET BY MOUTH DAILY, Disp: 30 tablet, Rfl: 3 .  metFORMIN (GLUCOPHAGE) 1000 MG tablet, Take 1 tablet (1,000 mg total) by mouth 2 (two) times daily., Disp: 180 tablet, Rfl: 2 .  mirabegron ER ([name]) 50 MG TB24 tablet, Take 1 tablet (50 mg total) by mouth daily., Disp: 90 tablet, Rfl: 1 .  omeprazole (PRILOSEC) 40 MG capsule, TAKE ONE CAPSULE BY MOUTH IN THE EVENING, Disp: 90 capsule, Rfl: 1 .  oxybutynin (DITROPAN XL) 15 MG 24 hr tablet, TAKE 1 TABLET BY MOUTH AT BEDTIME, Disp: 90 tablet, Rfl: 1 .  pilocarpine (SALAGEN) 5 MG tablet, Take 2 tablets (10 mg total) by mouth 3 (three) times daily., Disp: 540 tablet, Rfl: 2 .  pravastatin (PRAVACHOL) 40 MG tablet, TAKE 1 TABLET BY MOUTH DAILY, Disp: 90 tablet, Rfl: 1 .  PROAIR HFA 108 (90 Base) MCG/ACT inhaler, INHALE 2 PUFFS EVERY 4 TO 6 HOURS AS NEEDED, Disp: 25.5 g, Rfl: 3 .  traZODone (DESYREL) 100 MG tablet, TAKE TWO TABLETS BY MOUTH AT BEDTIME, Disp: 180 tablet, Rfl:  0  Assessment/ Plan: 63 y.o. female   1. Mixed incontinence urge and stress Urology referral placed to the specialist in [facility] - Ambulatory referral to Urology  2. Overactive bladder - Ambulatory referral to Urology  3. Nasal vestibulitis Treat with topical Bactroban.  We discussed red flag signs and symptoms warranting further evaluation and oral antibiotics.  She was good understanding of follow-up - mupirocin ointment (BACTROBAN) 2 %; Place 1 application into the nose 2 (two) times daily for 5 days.  Dispense: 22 g; Refill: 0  4.  Chronic ear pain, bilateral Uncertain etiology but possibly related to chronic TMJ.  Referral placed to ENT.  Recommend use of oral antihistamines in the interim. - Ambulatory referral to ENT  5. Bilateral temporomandibular joint pain   Start time: 1:05pm End time: 1:14pm  Total time spent on patient care (including telephone call/ virtual visit): 15 minutes  [place], [place] [phone]

## 2019-10-17 ENCOUNTER — Telehealth: Payer: Self-pay | Admitting: Family

## 2019-10-18 NOTE — Telephone Encounter (Signed)
Spoke with patient - she is aware her ref is in review with Dr. Deeann Saint office in [place]. Stated she would call their office to see where they are with scheduling.

## 2019-10-30 ENCOUNTER — Other Ambulatory Visit: Payer: Self-pay

## 2019-10-30 ENCOUNTER — Ambulatory Visit (INDEPENDENT_AMBULATORY_CARE_PROVIDER_SITE_OTHER): Payer: PRIVATE HEALTH INSURANCE | Admitting: Urology

## 2019-10-30 ENCOUNTER — Encounter: Payer: Self-pay | Admitting: Urology

## 2019-10-30 VITALS — BP 157/90 | HR 82 | Ht 67.0 in | Wt 219.0 lb

## 2019-10-30 DIAGNOSIS — N3946 Mixed incontinence: Secondary | ICD-10-CM

## 2019-10-30 NOTE — Progress Notes (Signed)
Patient present today complaining of leakage with interstim device. Impedance was checked no impedance noted and battery life at 24-84mo Current program is 2 at 2.2amp patient is feeling stimulation in vagina Program was changed to prg 1 stimulation felt in vaginal area at 1.1amp Patient was instructed to stay on this program for 1 week and keep voiding dairy. She will call next Monday and cancel apt if symptoms are better with program change. She is to see Dr. Matilde Sprang in 1 month for follow up possible discussion of restarting myrbetriq at that time if symptoms cannot improve with device setting changes

## 2019-10-30 NOTE — Progress Notes (Signed)
10/30/2019 9:24 AM   [name] [birth year] [id number]  Referring provider: Sharion [name], [name],  [name] [id number]  Chief Complaint  Patient presents with  . Urinary Incontinence  . Over Active Bladder    HPI: I saw patient in 2017.  She had urge incontinence and high-volume bedwetting with mild stress incontinence.  Based on urodynamics she had primarily an overactive bladder and did not have stress leakage at 131 cm of water.  She had a mildly symptomatic cystocele and had tried [employer] and [employer].  It appears that she had InterStim placed August 11, 2016  Today Patient currently has urgency incontinence.  She has severe bedwetting.  Her insurance changed so she is no longer being followed in [place].  She sometimes leaks a small amount with coughing.  She wears 4 pads a day moderately wet.  When she turns up the device she feels it in the vagina.  She has not changed the program.  Incontinence has worsened over 1 year.  Initially it worked reasonably well with less urge incontinence but she still had the bedwetting.  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  She is quite anxious.  I do not think she has ankle edema  Modifying factors: There are no other modifying factors  Associated signs and symptoms: There are no other associated signs and symptoms Aggravating and relieving factors: There are no other aggravating or relieving factors Severity: Moderate Duration: Persistent    PMH: Past Medical History:  Diagnosis Date  . Adenoid cystic carcinoma of submandibular gland [facility]) dx 2016---  oncologist-  dr Eppie Gibson [facility])   Left submandibular salivary gland--  T1 N0 M0,  Grade 2 w/ perineural invasion  s/p  resection 03-05-2015  and Radiation therapy 04-29-2015 to 06-11-2015  . Allergic rhinitis   . Anemia   . Arthritis   . Bipolar disorder ([place])    [facility]  . Breast cancer ([place])   . COPD  (chronic obstructive pulmonary disease) ([place])   . Cystocele   . Decreased sense of taste    secondary to radiation  . GAD (generalized anxiety disorder)   . GERD (gastroesophageal reflux disease)   . Headache   . Hemorrhoid    INTERNAL AND EXTERNAL    . History of adenomatous polyp of colon   . History of breast cancer dx 2007--- oncologist-- dr Lollie Marrow--- no recurrence   Right breast DCIS ,  Stage 2 (T2 N0 M0) --  s/p  right partial mastecotmy w/ sln dissection,  chemotherapy complete 02-01-2007,  radiation completed 06-15-2007  . History of esophageal dilatation   . History of esophagitis   . History of external beam radiation therapy    04-27-2007 to 06-15-2007 , right breast- 5040cGy in 28 sessions and boost 1200 cGy in 6 sessions/   04-29-2015 to 06-11-2015 , left neck and skull base -- 60 Gy in 30 fractions  . History of hiatal hernia   . History of panic attacks   . History of perforation of tympanic membrane    AFTER ACUTE OTITIS MEDIA 2016-- RESOLVED  . Hypertriglyceridemia    ?  Marland Kitchen Loss of teeth due to extraction    secondary to radiation therapy---  x8 or 9  . Mouth dryness    secondary to radiation  . Neck pain    resuidaul neck pain from radiation  . Peripheral neuropathy   . Personal history of  chemotherapy   . Personal history of radiation therapy   . TMJ (temporomandibular joint disorder)   . Type 2 diabetes mellitus ([place])   . Urge and stress incontinence   . Wears glasses     Surgical History: Past Surgical History:  Procedure Laterality Date  . COLONOSCOPY  03/30/2012   RMR: rectal and colonic polyps -removed as described above. Mellanosis coli  . COLONOSCOPY WITH PROPOFOL N/A 05/25/2016   Procedure: COLONOSCOPY WITH PROPOFOL;  Surgeon: Daneil Dolin, MD;  [facility];  Service: Endoscopy;  Laterality: N/A;  1400   . ECTOPIC PREGNANCY SURGERY    . ESOPHAGOGASTRODUODENOSCOPY (EGD) WITH PROPOFOL N/A 05/25/2016   Rourk: small hh, status post  dilation of Schatzki ring, normal colon. Next colonoscopy June 2022  . EXCISION / BIOPSY BREAST / NIPPLE / DUCT    . INTERSTIM IMPLANT PLACEMENT N/A 08/11/2016   Serial Number [id number] H.Procedure: INTERSTIM IMPLANT FIRST STAGE;  Surgeon: Bjorn Loser, MD;  Location[facility];  Service: Urology;  Laterality: N/A;  . INTERSTIM IMPLANT PLACEMENT N/A 08/11/2016   Procedure: Barrie Lyme IMPLANT SECOND STAGE IMPEDANCE CHECK;  Surgeon: Bjorn Loser, MD;  Location: [facility];  Service: Urology;  Laterality: N/A;  . Venia Minks DILATION N/A 05/25/2016   Procedure: Venia Minks DILATION;  Surgeon: Daneil Dolin, MD;  [facility];  Service: Endoscopy;  Laterality: N/A;  . PARTIAL MASTECTOMY WITH AXILLARY SENTINEL LYMPH NODE BIOPSY Right 12/01/2006   and Right axilla node dissection x1  . PORT-A-CATH PLACEMENT AND REMOVAL  12-31-2006/  05-13-2007  . SUBMANDIBULAR GLAND EXCISION Left 03/05/2015   adenoid cystic carcinoma  . TRANSTHORACIC ECHOCARDIOGRAM  12/29/2006   normal echo, ef 55%  . TUBAL LIGATION  yrs ago  . VAGINAL HYSTERECTOMY  10-26-2007  dr Elonda Husky   w/ Anterior Repair with intraspinous graft and vagainal vault suspension    Home Medications:  Allergies as of 10/30/2019      Reactions   Bee Venom Hives, Swelling, Other (See Comments)   Bee stings Bee stings Bee stings   Penicillins Anaphylaxis, Swelling   Has patient had a PCN reaction causing immediate rash, facial/tongue/throat swelling, SOB or lightheadedness with hypotension: Yes Has patient had a PCN reaction causing severe rash involving mucus membranes or skin necrosis: No Has patient had a PCN reaction that required hospitalization Yes Has patient had a PCN reaction occurring within the last 10 years: No If all of the above answers are "NO", then may proceed with Cephalosporin use.      Medication List       Accurate as of October 30, 2019  9:24 AM. If you have any questions, ask your  nurse or doctor.        Accu-Chek Aviva Plus test strip Generic drug: glucose blood USE TO TEST BLOOD GLUCOSE TWO TO THREE TIMES A DAY AS DIRECTED   Accu-Chek Softclix Lancets lancets USE TO TEST BLOOD GLUCOSE THREE TIMES DAILY AS DIRECTED   acetaminophen 500 MG tablet Commonly known as: TYLENOL Take 500 mg by mouth as needed.   ALPRAZolam 0.5 MG tablet Commonly known as: XANAX TAKE 1 TABLET BY MOUTH TWICE DAILY AS NEEDED FOR ANXIETY   aspirin 81 MG EC tablet Commonly known as: Aspirin Low Dose Take 1 tablet (81 mg total) by mouth daily. (Needs to be seen before next refill)   buPROPion 300 MG 24 hr tablet Commonly known as: Wellbutrin XL Take 1 tablet (300 mg total) by mouth daily.  diclofenac sodium 1 % Gel Commonly known as: VOLTAREN Apply 2 g topically 4 (four) times daily.   FLUoxetine 40 MG capsule Commonly known as: PROZAC TAKE ONE CAPSULE BY MOUTH DAILY   fluticasone furoate-vilanterol 200-25 MCG/INH Aepb Commonly known as: Breo Ellipta Inhale 1 puff into the lungs daily.   loratadine 10 MG tablet Commonly known as: CLARITIN TAKE 1 TABLET BY MOUTH EVERY DAY   meloxicam 15 MG tablet Commonly known as: MOBIC TAKE 1 TABLET BY MOUTH DAILY   metFORMIN 1000 MG tablet Commonly known as: GLUCOPHAGE Take 1 tablet (1,000 mg total) by mouth 2 (two) times daily.   mirabegron ER 50 MG Tb24 tablet Commonly known as: [name] Take 1 tablet (50 mg total) by mouth daily.   omeprazole 40 MG capsule Commonly known as: PRILOSEC TAKE ONE CAPSULE BY MOUTH IN THE EVENING   oxybutynin 15 MG 24 hr tablet Commonly known as: DITROPAN XL TAKE 1 TABLET BY MOUTH AT BEDTIME   pilocarpine 5 MG tablet Commonly known as: SALAGEN Take 2 tablets (10 mg total) by mouth 3 (three) times daily.   pravastatin 40 MG tablet Commonly known as: PRAVACHOL TAKE 1 TABLET BY MOUTH DAILY   ProAir HFA 108 (90 Base) MCG/ACT inhaler Generic drug: albuterol INHALE 2 PUFFS EVERY 4 TO 6  HOURS AS NEEDED   traZODone 100 MG tablet Commonly known as: DESYREL TAKE TWO TABLETS BY MOUTH AT BEDTIME       Allergies:  Allergies  Allergen Reactions  . Bee Venom Hives, Swelling and Other (See Comments)    Bee stings  Bee stings Bee stings   . Penicillins Anaphylaxis and Swelling    Has patient had a PCN reaction causing immediate rash, facial/tongue/throat swelling, SOB or lightheadedness with hypotension: Yes Has patient had a PCN reaction causing severe rash involving mucus membranes or skin necrosis: No Has patient had a PCN reaction that required hospitalization Yes Has patient had a PCN reaction occurring within the last 10 years: No If all of the above answers are "NO", then may proceed with Cephalosporin use.     Family History: Family History  Problem Relation Age of Onset  . Diabetes Mother   . Hypertension Mother   . Cancer Mother        unknown primary  . Bipolar disorder Mother   . Colon cancer Paternal Grandmother 5       deceased  . Diabetes Daughter 7       type 1  . Depression Sister   . Bipolar disorder Paternal Grandfather   . Depression Sister   . Liver disease Neg Hx     Social History:  reports that she quit smoking about 23 years ago. Her smoking use included cigarettes. She has a 37.50 pack-year smoking history. She has never used smokeless tobacco. She reports that she does not drink alcohol or use drugs.  ROS:                                        Physical Exam: There were no vitals taken for this visit.  Constitutional:  Alert and oriented, No acute distress. HEENT: [place] AT, moist mucus membranes.  Trachea midline, no masses. Cardiovascular: No clubbing, cyanosis, or edema. Respiratory: Normal respiratory effort, no increased work of breathing. GI: Abdomen is soft, nontender, nondistended, no abdominal masses GU: No CVA tenderness.  Device normal in right buttock with  normal incision Skin: No rashes,  bruises or suspicious lesions. Lymph: No cervical or inguinal adenopathy. Neurologic: Grossly intact, no focal deficits, moving all 4 extremities. Psychiatric: Normal mood and affect.  Laboratory Data: Lab Results  Component Value Date   WBC 5.1 08/02/2019   HGB 11.9 (L) 08/02/2019   HCT 38.0 08/02/2019   MCV 91.1 08/02/2019   PLT 314 08/02/2019    Lab Results  Component Value Date   CREATININE 0.96 08/02/2019    No results found for: PSA  No results found for: TESTOSTERONE  Lab Results  Component Value Date   HGBA1C 6.3 02/28/2019    Urinalysis    Component Value Date/Time   COLORURINE YELLOW 10/24/2007 1122   APPEARANCEUR CLEAR 10/24/2007 1122   LABSPEC >1.030 (H) 10/24/2007 1122   PHURINE 5.5 10/24/2007 1122   GLUCOSEU NEGATIVE 10/24/2007 1122   HGBUR NEGATIVE 10/24/2007 1122   BILIRUBINUR NEGATIVE 10/24/2007 1122   KETONESUR NEGATIVE 10/24/2007 1122   PROTEINUR NEGATIVE 10/24/2007 1122   UROBILINOGEN 0.2 10/24/2007 1122   NITRITE NEGATIVE 10/24/2007 1122   LEUKOCYTESUR  10/24/2007 1122    NEGATIVE MICROSCOPIC NOT DONE ON URINES WITH NEGATIVE PROTEIN, BLOOD, LEUKOCYTES, NITRITE, OR GLUCOSE <1000 mg/dL.    Pertinent Imaging:   Assessment & Plan: Program protocol initiated.  If need to I will review my notes in [place]  There are no diagnoses linked to this encounter.  No follow-ups on file.  Reece Packer, MD  [facility] [address], [place], [place] [address] [phone]

## 2019-10-30 NOTE — Patient Instructions (Signed)
Tracking Your Bladder Symptoms    Patient Name:___________________________________________________   Sample: Day   Daytime Voids  Nighttime Voids Urgency for the Day(0-4) Number of Accidents Beverage Comments  Monday IIII II 2 I Water IIII Coffee  I      Week Starting:____________________________________   Day Daytime  Voids Nighttime  Voids Urgency for  The Day(0-4) Number of Accidents Beverages Comments                                                           This week my symptoms were:  O much better  O better O the same O worse   

## 2019-11-06 ENCOUNTER — Ambulatory Visit: Payer: PRIVATE HEALTH INSURANCE

## 2019-11-07 ENCOUNTER — Other Ambulatory Visit: Payer: Self-pay | Admitting: Family

## 2019-11-07 DIAGNOSIS — M159 Polyosteoarthritis, unspecified: Secondary | ICD-10-CM

## 2019-11-07 DIAGNOSIS — J301 Allergic rhinitis due to pollen: Secondary | ICD-10-CM

## 2019-11-07 DIAGNOSIS — G4700 Insomnia, unspecified: Secondary | ICD-10-CM

## 2019-11-20 ENCOUNTER — Other Ambulatory Visit: Payer: Self-pay | Admitting: Family

## 2019-11-20 DIAGNOSIS — F411 Generalized anxiety disorder: Secondary | ICD-10-CM

## 2019-11-20 DIAGNOSIS — J301 Allergic rhinitis due to pollen: Secondary | ICD-10-CM

## 2019-11-20 DIAGNOSIS — G4700 Insomnia, unspecified: Secondary | ICD-10-CM

## 2019-11-20 DIAGNOSIS — J449 Chronic obstructive pulmonary disease, unspecified: Secondary | ICD-10-CM

## 2019-11-20 DIAGNOSIS — M159 Polyosteoarthritis, unspecified: Secondary | ICD-10-CM

## 2019-11-23 ENCOUNTER — Ambulatory Visit (INDEPENDENT_AMBULATORY_CARE_PROVIDER_SITE_OTHER): Payer: PRIVATE HEALTH INSURANCE | Admitting: Otolaryngology

## 2019-11-23 ENCOUNTER — Other Ambulatory Visit: Payer: Self-pay

## 2019-11-27 ENCOUNTER — Ambulatory Visit (INDEPENDENT_AMBULATORY_CARE_PROVIDER_SITE_OTHER): Payer: PRIVATE HEALTH INSURANCE | Admitting: Otolaryngology

## 2019-12-04 ENCOUNTER — Ambulatory Visit: Payer: PRIVATE HEALTH INSURANCE | Admitting: Urology

## 2019-12-07 ENCOUNTER — Other Ambulatory Visit: Payer: Self-pay | Admitting: Family

## 2019-12-07 DIAGNOSIS — F4310 Post-traumatic stress disorder, unspecified: Secondary | ICD-10-CM

## 2019-12-07 DIAGNOSIS — F331 Major depressive disorder, recurrent, moderate: Secondary | ICD-10-CM

## 2019-12-07 DIAGNOSIS — F411 Generalized anxiety disorder: Secondary | ICD-10-CM

## 2019-12-11 ENCOUNTER — Ambulatory Visit: Payer: PRIVATE HEALTH INSURANCE | Admitting: Urology

## 2020-01-07 ENCOUNTER — Other Ambulatory Visit: Payer: Self-pay | Admitting: Family

## 2020-01-07 DIAGNOSIS — F411 Generalized anxiety disorder: Secondary | ICD-10-CM

## 2020-02-08 ENCOUNTER — Other Ambulatory Visit (HOSPITAL_COMMUNITY): Payer: PRIVATE HEALTH INSURANCE

## 2020-02-15 ENCOUNTER — Ambulatory Visit (HOSPITAL_COMMUNITY): Payer: PRIVATE HEALTH INSURANCE | Admitting: Hematology

## 2020-02-29 ENCOUNTER — Other Ambulatory Visit: Payer: Self-pay | Admitting: Family

## 2020-02-29 ENCOUNTER — Inpatient Hospital Stay (HOSPITAL_COMMUNITY): Payer: Medicare Other | Attending: Hematology

## 2020-02-29 DIAGNOSIS — K219 Gastro-esophageal reflux disease without esophagitis: Secondary | ICD-10-CM

## 2020-03-05 ENCOUNTER — Other Ambulatory Visit: Payer: Self-pay | Admitting: Family

## 2020-03-05 DIAGNOSIS — G4700 Insomnia, unspecified: Secondary | ICD-10-CM

## 2020-03-07 ENCOUNTER — Ambulatory Visit (HOSPITAL_COMMUNITY): Payer: Self-pay | Admitting: Hematology

## 2020-04-02 ENCOUNTER — Other Ambulatory Visit: Payer: Self-pay | Admitting: *Deleted

## 2020-04-02 DIAGNOSIS — N3946 Mixed incontinence: Secondary | ICD-10-CM

## 2020-04-02 DIAGNOSIS — F331 Major depressive disorder, recurrent, moderate: Secondary | ICD-10-CM

## 2020-04-02 DIAGNOSIS — F411 Generalized anxiety disorder: Secondary | ICD-10-CM

## 2020-04-02 NOTE — Telephone Encounter (Signed)
[place] 08/03/19 mail order not sent

## 2020-04-03 ENCOUNTER — Other Ambulatory Visit: Payer: Self-pay | Admitting: *Deleted

## 2020-04-03 DIAGNOSIS — E1169 Type 2 diabetes mellitus with other specified complication: Secondary | ICD-10-CM

## 2020-04-03 DIAGNOSIS — F4310 Post-traumatic stress disorder, unspecified: Secondary | ICD-10-CM

## 2020-04-03 DIAGNOSIS — J449 Chronic obstructive pulmonary disease, unspecified: Secondary | ICD-10-CM

## 2020-04-03 DIAGNOSIS — G4700 Insomnia, unspecified: Secondary | ICD-10-CM

## 2020-04-03 DIAGNOSIS — K219 Gastro-esophageal reflux disease without esophagitis: Secondary | ICD-10-CM

## 2020-04-03 DIAGNOSIS — E119 Type 2 diabetes mellitus without complications: Secondary | ICD-10-CM

## 2020-04-03 DIAGNOSIS — F411 Generalized anxiety disorder: Secondary | ICD-10-CM

## 2020-04-03 NOTE — Telephone Encounter (Signed)
[place] 08/03/19 mail order not sent

## 2020-04-10 ENCOUNTER — Telehealth: Payer: Self-pay | Admitting: Family

## 2020-04-10 ENCOUNTER — Other Ambulatory Visit: Payer: Self-pay | Admitting: *Deleted

## 2020-04-10 DIAGNOSIS — G4700 Insomnia, unspecified: Secondary | ICD-10-CM

## 2020-04-10 DIAGNOSIS — K219 Gastro-esophageal reflux disease without esophagitis: Secondary | ICD-10-CM

## 2020-04-10 MED ORDER — TRAZODONE HCL 100 MG PO TABS: 200.0000 mg | ORAL_TABLET | Freq: Every day | ORAL | 0 refills | Status: DC

## 2020-04-10 MED ORDER — OMEPRAZOLE 40 MG PO CPDR: DELAYED_RELEASE_CAPSULE | ORAL | 0 refills | Status: DC

## 2020-04-10 MED ORDER — OXYBUTYNIN CHLORIDE ER 15 MG PO TB24: 15.0000 mg | ORAL_TABLET | Freq: Every day | ORAL | 0 refills | Status: DC

## 2020-04-10 NOTE — Telephone Encounter (Signed)
Aware.  Medications were resent to mail order pharmacy. Patient has follow up scheduled with provider and will get other medications filled then.

## 2020-04-11 ENCOUNTER — Other Ambulatory Visit: Payer: Self-pay | Admitting: *Deleted

## 2020-04-11 DIAGNOSIS — F4310 Post-traumatic stress disorder, unspecified: Secondary | ICD-10-CM

## 2020-04-11 DIAGNOSIS — E119 Type 2 diabetes mellitus without complications: Secondary | ICD-10-CM

## 2020-04-11 DIAGNOSIS — J449 Chronic obstructive pulmonary disease, unspecified: Secondary | ICD-10-CM

## 2020-04-11 DIAGNOSIS — E1169 Type 2 diabetes mellitus with other specified complication: Secondary | ICD-10-CM

## 2020-04-11 DIAGNOSIS — F411 Generalized anxiety disorder: Secondary | ICD-10-CM

## 2020-04-11 DIAGNOSIS — F331 Major depressive disorder, recurrent, moderate: Secondary | ICD-10-CM

## 2020-04-11 DIAGNOSIS — N3946 Mixed incontinence: Secondary | ICD-10-CM

## 2020-04-11 MED ORDER — METFORMIN HCL 1000 MG PO TABS: 1000.0000 mg | ORAL_TABLET | Freq: Two times a day (BID) | ORAL | 0 refills | Status: DC

## 2020-04-11 MED ORDER — BREO ELLIPTA 200-25 MCG/INH IN AEPB: INHALATION_SPRAY | RESPIRATORY_TRACT | 0 refills | Status: DC

## 2020-04-11 MED ORDER — MIRABEGRON ER 50 MG PO TB24: 50.0000 mg | ORAL_TABLET | Freq: Every day | ORAL | 0 refills | Status: DC

## 2020-04-11 MED ORDER — PRAVASTATIN SODIUM 40 MG PO TABS: 40.0000 mg | ORAL_TABLET | Freq: Every day | ORAL | 0 refills | Status: DC

## 2020-04-11 MED ORDER — FLUOXETINE HCL 40 MG PO CAPS: 40.0000 mg | ORAL_CAPSULE | Freq: Every day | ORAL | 0 refills | Status: DC

## 2020-04-11 MED ORDER — BUPROPION HCL ER (XL) 300 MG PO TB24: 300.0000 mg | ORAL_TABLET | Freq: Every day | ORAL | 0 refills | Status: DC

## 2020-04-11 NOTE — Telephone Encounter (Signed)
OV 04/23/20

## 2020-04-15 ENCOUNTER — Telehealth: Payer: Self-pay | Admitting: Family

## 2020-04-15 NOTE — Telephone Encounter (Signed)
Pt still has refills at [facility] of these medications

## 2020-04-15 NOTE — Telephone Encounter (Signed)
°  Prescription Request  04/15/2020  What is the name of the medication or equipment? 3 meds from last note on 04-10-2020  Have you contacted your pharmacy to request a refill? (if applicable) Yes  Which pharmacy would you like this sent to? [facility]   Patient notified that their request is being sent to the clinical staff for review and that they should receive a response within 2 business days.   [facility] mail order is her normal Pharmacy, but she needs 30 day supply sent in until she gets mail order.  [name]' pt.  She is completely out!

## 2020-04-22 ENCOUNTER — Telehealth: Payer: Self-pay

## 2020-04-22 ENCOUNTER — Other Ambulatory Visit: Payer: Self-pay | Admitting: Family

## 2020-04-22 DIAGNOSIS — F411 Generalized anxiety disorder: Secondary | ICD-10-CM

## 2020-04-22 NOTE — Telephone Encounter (Signed)
Ref #[id number]  Pharmacy called to ask if patient is supposed to be on Mybetriq or oxybutyrin?   Please review and advise

## 2020-04-22 NOTE — Telephone Encounter (Signed)
Can we call patient and verify which one she is taking?

## 2020-04-23 ENCOUNTER — Encounter: Payer: Self-pay | Admitting: Family

## 2020-04-23 ENCOUNTER — Other Ambulatory Visit: Payer: Self-pay

## 2020-04-23 ENCOUNTER — Ambulatory Visit (INDEPENDENT_AMBULATORY_CARE_PROVIDER_SITE_OTHER): Payer: Medicare Other | Admitting: Family

## 2020-04-23 VITALS — BP 127/76 | HR 94 | Temp 97.7°F | Ht 67.0 in | Wt 218.2 lb

## 2020-04-23 DIAGNOSIS — J449 Chronic obstructive pulmonary disease, unspecified: Secondary | ICD-10-CM

## 2020-04-23 DIAGNOSIS — N3946 Mixed incontinence: Secondary | ICD-10-CM | POA: Diagnosis not present

## 2020-04-23 DIAGNOSIS — Z79899 Other long term (current) drug therapy: Secondary | ICD-10-CM

## 2020-04-23 DIAGNOSIS — F331 Major depressive disorder, recurrent, moderate: Secondary | ICD-10-CM

## 2020-04-23 DIAGNOSIS — E785 Hyperlipidemia, unspecified: Secondary | ICD-10-CM

## 2020-04-23 DIAGNOSIS — Z23 Encounter for immunization: Secondary | ICD-10-CM

## 2020-04-23 DIAGNOSIS — M159 Polyosteoarthritis, unspecified: Secondary | ICD-10-CM | POA: Diagnosis not present

## 2020-04-23 DIAGNOSIS — G4700 Insomnia, unspecified: Secondary | ICD-10-CM

## 2020-04-23 DIAGNOSIS — K219 Gastro-esophageal reflux disease without esophagitis: Secondary | ICD-10-CM

## 2020-04-23 DIAGNOSIS — E1169 Type 2 diabetes mellitus with other specified complication: Secondary | ICD-10-CM

## 2020-04-23 DIAGNOSIS — F411 Generalized anxiety disorder: Secondary | ICD-10-CM

## 2020-04-23 DIAGNOSIS — Z780 Asymptomatic menopausal state: Secondary | ICD-10-CM

## 2020-04-23 DIAGNOSIS — K5900 Constipation, unspecified: Secondary | ICD-10-CM

## 2020-04-23 DIAGNOSIS — Z85819 Personal history of malignant neoplasm of unspecified site of lip, oral cavity, and pharynx: Secondary | ICD-10-CM

## 2020-04-23 DIAGNOSIS — Z853 Personal history of malignant neoplasm of breast: Secondary | ICD-10-CM

## 2020-04-23 DIAGNOSIS — F4310 Post-traumatic stress disorder, unspecified: Secondary | ICD-10-CM

## 2020-04-23 DIAGNOSIS — F1320 Sedative, hypnotic or anxiolytic dependence, uncomplicated: Secondary | ICD-10-CM

## 2020-04-23 LAB — BAYER DCA HB A1C WAIVED: HB A1C (BAYER DCA - WAIVED): 7.5 % — ABNORMAL HIGH (ref <7.0)

## 2020-04-23 MED ORDER — ALBUTEROL SULFATE HFA 108 (90 BASE) MCG/ACT IN AERS: INHALATION_SPRAY | RESPIRATORY_TRACT | 3 refills | Status: DC

## 2020-04-23 MED ORDER — TRAZODONE HCL 100 MG PO TABS: 200.0000 mg | ORAL_TABLET | Freq: Every day | ORAL | 0 refills | Status: DC

## 2020-04-23 MED ORDER — PRAVASTATIN SODIUM 40 MG PO TABS: 40.0000 mg | ORAL_TABLET | Freq: Every day | ORAL | 0 refills | Status: DC

## 2020-04-23 MED ORDER — OMEPRAZOLE 40 MG PO CPDR: DELAYED_RELEASE_CAPSULE | ORAL | 0 refills | Status: DC

## 2020-04-23 MED ORDER — OXYBUTYNIN CHLORIDE ER 15 MG PO TB24: 15.0000 mg | ORAL_TABLET | Freq: Every day | ORAL | 0 refills | Status: DC

## 2020-04-23 MED ORDER — FLUTICASONE PROPIONATE 50 MCG/ACT NA SUSP: NASAL | 5 refills | Status: AC

## 2020-04-23 MED ORDER — BUPROPION HCL ER (XL) 300 MG PO TB24: 300.0000 mg | ORAL_TABLET | Freq: Every day | ORAL | 0 refills | Status: DC

## 2020-04-23 MED ORDER — MELOXICAM 15 MG PO TABS: 15.0000 mg | ORAL_TABLET | Freq: Every day | ORAL | 3 refills | Status: DC

## 2020-04-23 MED ORDER — ALPRAZOLAM 0.5 MG PO TABS: 0.5000 mg | ORAL_TABLET | Freq: Two times a day (BID) | ORAL | 2 refills | Status: DC | PRN

## 2020-04-23 MED ORDER — MIRABEGRON ER 50 MG PO TB24: 50.0000 mg | ORAL_TABLET | Freq: Every day | ORAL | 4 refills | Status: DC

## 2020-04-23 MED ORDER — FLUOXETINE HCL 40 MG PO CAPS: 40.0000 mg | ORAL_CAPSULE | Freq: Every day | ORAL | 0 refills | Status: DC

## 2020-04-23 NOTE — Progress Notes (Signed)
Subjective:    Patient ID: [name], female    DOB: 03-30-1956, 64 y.o.   MRN: [id number]  Chief Complaint  Patient presents with  . Medical Management of Chronic Issues   PT presents to the office today for chronic follow up.  Pt history of Left submandibular cancerandOncologists and wasfollowed by them annually.PT had been followed by Urologists for urinary incontinence. Gastroesophageal Reflux She complains of belching and heartburn. This is a chronic problem. The current episode started more than 1 year ago. The problem occurs occasionally. The problem has been waxing and waning. The symptoms are aggravated by certain foods. Risk factors include obesity. She has tried a PPI for the symptoms. The treatment provided moderate relief.  Diabetes She presents for her follow-up diabetic visit. She has type 2 diabetes mellitus. Her disease course has been stable. There are no hypoglycemic associated symptoms. Pertinent negatives for diabetes include no blurred vision and no foot paresthesias. Symptoms are stable. Pertinent negatives for diabetic complications include no heart disease or peripheral neuropathy. Risk factors for coronary artery disease include dyslipidemia, diabetes mellitus, hypertension and sedentary lifestyle. She is following a generally healthy diet. Her overall blood glucose range is 110-130 mg/dl.  Hyperlipidemia This is a chronic problem. The current episode started more than 1 year ago. The problem is uncontrolled. Exacerbating diseases include obesity. Current antihyperlipidemic treatment includes statins. The current treatment provides moderate improvement of lipids. Risk factors for coronary artery disease include dyslipidemia, diabetes mellitus, hypertension and a sedentary lifestyle.  Arthritis Presents for follow-up visit. She complains of pain and stiffness. Affected locations include the right knee, left knee and neck (back). Her pain is at a severity of  10/10.  Depression        This is a chronic problem.  The current episode started more than 1 year ago.   The onset quality is sudden.   The problem occurs intermittently.  The problem has been waxing and waning since onset.  Associated symptoms include insomnia, irritable, restlessness and sad.  Past treatments include SSRIs - Selective serotonin reuptake inhibitors.  Compliance with treatment is good. Insomnia Primary symptoms: difficulty falling asleep, frequent awakening.  The current episode started more than one year. PMH includes: depression.  Constipation This is a chronic problem. The current episode started more than 1 year ago. Her stool frequency is 4 to 5 times per week.      Review of Systems  Eyes: Negative for blurred vision.  Gastrointestinal: Positive for constipation and heartburn.  Musculoskeletal: Positive for arthritis and stiffness.  Psychiatric/Behavioral: Positive for depression. The patient has insomnia.   All other systems reviewed and are negative.      Objective:   Physical Exam Vitals reviewed.  Constitutional:      General: She is irritable. She is not in acute distress.    Appearance: She is well-developed.  HENT:     Head: Normocephalic and atraumatic.     Right Ear: Tympanic membrane normal.     Left Ear: Tympanic membrane normal.  Eyes:     Pupils: Pupils are equal, round, and reactive to light.  Neck:     Thyroid: No thyromegaly.  Cardiovascular:     Rate and Rhythm: Normal rate and regular rhythm.     Heart sounds: Normal heart sounds. No murmur.  Pulmonary:     Effort: Pulmonary effort is normal. No respiratory distress.     Breath sounds: Normal breath sounds. No wheezing.  Abdominal:  General: Bowel sounds are normal. There is no distension.     Palpations: Abdomen is soft.     Tenderness: There is no abdominal tenderness.  Musculoskeletal:        General: No tenderness. Normal range of motion.     Cervical back: Normal range  of motion and neck supple.  Skin:    General: Skin is warm and dry.  Neurological:     Mental Status: She is alert and oriented to person, place, and time.     Cranial Nerves: No cranial nerve deficit.     Deep Tendon Reflexes: Reflexes are normal and symmetric.  Psychiatric:        Mood and Affect: Mood is anxious.        Behavior: Behavior normal.        Thought Content: Thought content normal.        Judgment: Judgment normal.      BP 127/76   Pulse 94   Temp 97.7 F (36.5 C) (Temporal)   Ht '5\' 7"'  (1.702 m)   Wt 218 lb 3.2 oz (99 kg)   SpO2 95%   BMI 34.17 kg/m       Assessment & Plan:  [name] comes in today with chief complaint of Medical Management of Chronic Issues   Diagnosis and orders addressed:  1. Osteoarthritis of multiple joints, unspecified osteoarthritis type - meloxicam (MOBIC) 15 MG tablet; Take 1 tablet (15 mg total) by mouth daily.  Dispense: 90 tablet; Refill: 3 - CBC with Differential/Platelet - CMP14+EGFR  2. Mixed incontinence urge and stress Will get myrbetriq from health department, since she can not afford medication.  - oxybutynin (DITROPAN XL) 15 MG 24 hr tablet; Take 1 tablet (15 mg total) by mouth at bedtime.  Dispense: 90 tablet; Refill: 0 - mirabegron ER ([name]) 50 MG TB24 tablet; Take 1 tablet (50 mg total) by mouth daily.  Dispense: 90 tablet; Refill: 4 - CBC with Differential/Platelet - CMP14+EGFR  3. Insomnia, unspecified type - traZODone (DESYREL) 100 MG tablet; Take 2 tablets (200 mg total) by mouth at bedtime.  Dispense: 180 tablet; Refill: 0 - CBC with Differential/Platelet - CMP14+EGFR  4. GAD (generalized anxiety disorder) - FLUoxetine (PROZAC) 40 MG capsule; Take 1 capsule (40 mg total) by mouth daily.  Dispense: 90 capsule; Refill: 0 - buPROPion (WELLBUTRIN XL) 300 MG 24 hr tablet; Take 1 tablet (300 mg total) by mouth daily.  Dispense: 90 tablet; Refill: 0 - ALPRAZolam (XANAX) 0.5 MG tablet; Take 1 tablet  (0.5 mg total) by mouth 2 (two) times daily as needed. for anxiety  Dispense: 60 tablet; Refill: 2 - CBC with Differential/Platelet - CMP14+EGFR - ToxASSURE Select 13 (MW), Urine  5. Major depressive disorder, recurrent episode, moderate (HCC) - FLUoxetine (PROZAC) 40 MG capsule; Take 1 capsule (40 mg total) by mouth daily.  Dispense: 90 capsule; Refill: 0 - CBC with Differential/Platelet - CMP14+EGFR  6. PTSD (post-traumatic stress disorder) - buPROPion (WELLBUTRIN XL) 300 MG 24 hr tablet; Take 1 tablet (300 mg total) by mouth daily.  Dispense: 90 tablet; Refill: 0 - CBC with Differential/Platelet - CMP14+EGFR  7. Gastroesophageal reflux disease - omeprazole (PRILOSEC) 40 MG capsule; Take one capsule 30 minutes before breakfast  Dispense: 90 capsule; Refill: 0 - CBC with Differential/Platelet - CMP14+EGFR  8. Hyperlipidemia associated with type 2 diabetes mellitus (HCC) - pravastatin (PRAVACHOL) 40 MG tablet; Take 1 tablet (40 mg total) by mouth daily.  Dispense: 90 tablet; Refill: 0 - CBC  with Differential/Platelet - CMP14+EGFR - Lipid panel  9. Chronic obstructive pulmonary disease, unspecified COPD type ([place]) - CBC with Differential/Platelet - CMP14+EGFR  10. Gastroesophageal reflux disease, unspecified whether esophagitis present - CBC with Differential/Platelet - CMP14+EGFR  11. Type 2 diabetes mellitus with other specified complication, without long-term current use of insulin (HCC) - Bayer DCA Hb A1c Waived - CBC with Differential/Platelet - CMP14+EGFR - Microalbumin / creatinine urine ratio  12. Constipation, unspecified constipation type - CBC with Differential/Platelet - CMP14+EGFR  13. H/O malignant neoplasm of submandibular gland - CBC with Differential/Platelet - CMP14+EGFR  14. History of right breast cancer - CBC with Differential/Platelet - CMP14+EGFR  15. Benzodiazepine dependence (HCC) - ALPRAZolam (XANAX) 0.5 MG tablet; Take 1 tablet (0.5 mg  total) by mouth 2 (two) times daily as needed. for anxiety  Dispense: 60 tablet; Refill: 2 - CBC with Differential/Platelet - CMP14+EGFR - ToxASSURE Select 13 (MW), Urine  16. Controlled substance agreement signed - ALPRAZolam (XANAX) 0.5 MG tablet; Take 1 tablet (0.5 mg total) by mouth 2 (two) times daily as needed. for anxiety  Dispense: 60 tablet; Refill: 2 - CBC with Differential/Platelet - CMP14+EGFR - ToxASSURE Select 13 (MW), Urine  17. Post-menopause - DG [facility] DEXA   Labs pending Pt reviewed in [place] controlled database- No red flags, contract and drug screen up dated today Health Maintenance reviewed Diet and exercise encouraged  Follow up plan: 3 months    Evelina Dun, FNP

## 2020-04-23 NOTE — Patient Instructions (Signed)

## 2020-04-23 NOTE — Telephone Encounter (Signed)
Appt today

## 2020-04-23 NOTE — Addendum Note (Signed)
Addended by: Brynda Peon F on: 04/23/2020 12:44 PM   Modules accepted: Orders

## 2020-04-23 NOTE — Telephone Encounter (Signed)
Left message to call back  

## 2020-04-24 ENCOUNTER — Other Ambulatory Visit: Payer: Self-pay | Admitting: Family

## 2020-04-24 ENCOUNTER — Telehealth: Payer: Self-pay | Admitting: Family

## 2020-04-24 LAB — CBC WITH DIFFERENTIAL/PLATELET
Basophils Absolute: 0 10*3/uL (ref 0.0–0.2)
Basos: 1 %
EOS (ABSOLUTE): 0.2 10*3/uL (ref 0.0–0.4)
Eos: 3 %
Hematocrit: 38.5 % (ref 34.0–46.6)
Hemoglobin: 12.4 g/dL (ref 11.1–15.9)
Immature Grans (Abs): 0 10*3/uL (ref 0.0–0.1)
Immature Granulocytes: 0 %
Lymphocytes Absolute: 1.4 10*3/uL (ref 0.7–3.1)
Lymphs: 22 %
MCH: 28.1 pg (ref 26.6–33.0)
MCHC: 32.2 g/dL (ref 31.5–35.7)
MCV: 87 fL (ref 79–97)
Monocytes Absolute: 0.4 10*3/uL (ref 0.1–0.9)
Monocytes: 6 %
Neutrophils Absolute: 4.2 10*3/uL (ref 1.4–7.0)
Neutrophils: 68 %
Platelets: 317 10*3/uL (ref 150–450)
RBC: 4.41 x10E6/uL (ref 3.77–5.28)
RDW: 13.6 % (ref 11.7–15.4)
WBC: 6.2 10*3/uL (ref 3.4–10.8)

## 2020-04-24 LAB — CMP14+EGFR
ALT: 14 IU/L (ref 0–32)
AST: 16 IU/L (ref 0–40)
Albumin/Globulin Ratio: 2.1 (ref 1.2–2.2)
Albumin: 4.5 g/dL (ref 3.8–4.8)
Alkaline Phosphatase: 64 IU/L (ref 39–117)
BUN/Creatinine Ratio: 16 (ref 12–28)
BUN: 16 mg/dL (ref 8–27)
Bilirubin Total: 0.3 mg/dL (ref 0.0–1.2)
CO2: 25 mmol/L (ref 20–29)
Calcium: 9.9 mg/dL (ref 8.7–10.3)
Chloride: 98 mmol/L (ref 96–106)
Creatinine, Ser: 1.02 mg/dL — ABNORMAL HIGH (ref 0.57–1.00)
GFR calc Af Amer: 67 mL/min/{1.73_m2} (ref >59)
GFR calc non Af Amer: 58 mL/min/{1.73_m2} — ABNORMAL LOW (ref >59)
Globulin, Total: 2.1 g/dL (ref 1.5–4.5)
Glucose: 130 mg/dL — ABNORMAL HIGH (ref 65–99)
Potassium: 4.8 mmol/L (ref 3.5–5.2)
Sodium: 136 mmol/L (ref 134–144)
Total Protein: 6.6 g/dL (ref 6.0–8.5)

## 2020-04-24 LAB — MICROALBUMIN / CREATININE URINE RATIO
Creatinine, Urine: 231.6 mg/dL
Microalb/Creat Ratio: 4 mg/g creat (ref 0–29)
Microalbumin, Urine: 9.6 ug/mL

## 2020-04-24 LAB — LIPID PANEL
Chol/HDL Ratio: 2.8 ratio (ref 0.0–4.4)
Cholesterol, Total: 160 mg/dL (ref 100–199)
HDL: 57 mg/dL (ref >39)
LDL Chol Calc (NIH): 79 mg/dL (ref 0–99)
Triglycerides: 140 mg/dL (ref 0–149)
VLDL Cholesterol Cal: 24 mg/dL (ref 5–40)

## 2020-04-24 MED ORDER — ATORVASTATIN CALCIUM 20 MG PO TABS
20.0000 mg | ORAL_TABLET | Freq: Every day | ORAL | 3 refills | Status: DC
Start: 2020-04-24 — End: 2020-09-27

## 2020-04-24 NOTE — Telephone Encounter (Signed)
Explained to patient that [name] refilled multiple medications to her [facility] on 04/23/2020. We went over which ones. Instructed patient to contact her pharmacy for future refills.

## 2020-04-25 LAB — TOXASSURE SELECT 13 (MW), URINE

## 2020-04-29 ENCOUNTER — Telehealth: Payer: Self-pay | Admitting: *Deleted

## 2020-04-29 NOTE — Telephone Encounter (Signed)
Fax from [facility] [place] written for ProAir Patients plan only covers ventolin HFA Please escribe new Rx for Ventolin HFA

## 2020-04-30 MED ORDER — ALBUTEROL SULFATE HFA 108 (90 BASE) MCG/ACT IN AERS
2.0000 | INHALATION_SPRAY | Freq: Four times a day (QID) | RESPIRATORY_TRACT | 2 refills | Status: DC | PRN
Start: 2020-04-30 — End: 2020-07-15

## 2020-04-30 NOTE — Addendum Note (Signed)
Addended by: Evelina Dun A on: 04/30/2020 01:13 PM   Modules accepted: Orders

## 2020-04-30 NOTE — Telephone Encounter (Signed)
Prescription sent to pharmacy.

## 2020-05-05 ENCOUNTER — Other Ambulatory Visit: Payer: Self-pay | Admitting: Family

## 2020-05-05 DIAGNOSIS — M159 Polyosteoarthritis, unspecified: Secondary | ICD-10-CM

## 2020-05-06 ENCOUNTER — Other Ambulatory Visit: Payer: Self-pay | Admitting: *Deleted

## 2020-05-06 DIAGNOSIS — J301 Allergic rhinitis due to pollen: Secondary | ICD-10-CM

## 2020-05-06 MED ORDER — LORATADINE 10 MG PO TABS: 10.0000 mg | ORAL_TABLET | Freq: Every day | ORAL | 1 refills | Status: DC

## 2020-05-06 NOTE — Telephone Encounter (Signed)
Fax from [facility] RF request for Pilocarpine 5 mg Not on current med list Last OV 04/23/20 Next OV 07/25/20

## 2020-05-08 ENCOUNTER — Telehealth: Payer: Self-pay | Admitting: *Deleted

## 2020-05-08 NOTE — Telephone Encounter (Signed)
Fax from [facility] RF request for Pilocarpine 5 mg Not on current med list Last OV 04/23/20 Next OV 07/25/20

## 2020-05-09 MED ORDER — PILOCARPINE HCL 5 MG PO TABS: 5.0000 mg | ORAL_TABLET | Freq: Two times a day (BID) | ORAL | 2 refills | Status: DC

## 2020-05-09 NOTE — Addendum Note (Signed)
Addended by: Evelina Dun A on: 05/09/2020 07:56 AM   Modules accepted: Orders

## 2020-05-09 NOTE — Telephone Encounter (Signed)
Prescription sent to pharmacy.

## 2020-06-12 ENCOUNTER — Other Ambulatory Visit: Payer: Self-pay | Admitting: Family

## 2020-06-12 DIAGNOSIS — E119 Type 2 diabetes mellitus without complications: Secondary | ICD-10-CM

## 2020-06-12 DIAGNOSIS — N3946 Mixed incontinence: Secondary | ICD-10-CM

## 2020-07-12 ENCOUNTER — Other Ambulatory Visit: Payer: Self-pay | Admitting: Family

## 2020-07-25 ENCOUNTER — Ambulatory Visit: Payer: Medicare Other | Admitting: Family

## 2020-07-30 ENCOUNTER — Telehealth: Payer: Self-pay | Admitting: Family

## 2020-07-30 NOTE — Telephone Encounter (Signed)
  Prescription Request  07/30/2020  What is the name of the medication or equipment? ALPRAZolam (XANAX) 0.5 MG tablet pt is waitng on rx to come in mail  From [place]. She wants 10 pills called into to [facility].  Have you contacted your pharmacy to request a refill? (if applicable) no  Which pharmacy would you like this sent to? [facility] pharmacy   Patient notified that their request is being sent to the clinical staff for review and that they should receive a response within 2 business days.

## 2020-07-30 NOTE — Telephone Encounter (Signed)
Left message informing pt that we cannot refill this type of medication outside of an office visit.  Pt did have a 61m OV on 07/25/20 but cancelled. Instructed pt to call to schedule an appointment.

## 2020-08-02 ENCOUNTER — Other Ambulatory Visit: Payer: Self-pay

## 2020-08-02 MED ORDER — PILOCARPINE HCL 5 MG PO TABS: 5.0000 mg | ORAL_TABLET | Freq: Two times a day (BID) | ORAL | 0 refills | Status: DC

## 2020-08-04 ENCOUNTER — Other Ambulatory Visit: Payer: Self-pay | Admitting: Family

## 2020-08-16 IMAGING — MG DIGITAL SCREENING BILATERAL MAMMOGRAM WITH TOMO AND CAD
6 of 10 series · 6 of 30 positions shown · non-contrast
Comparison: Previous exam(s).

CLINICAL DATA: Screening.

EXAM:
DIGITAL SCREENING BILATERAL MAMMOGRAM WITH TOMO AND CAD

[R MLO synth-2D (1 of 2)]
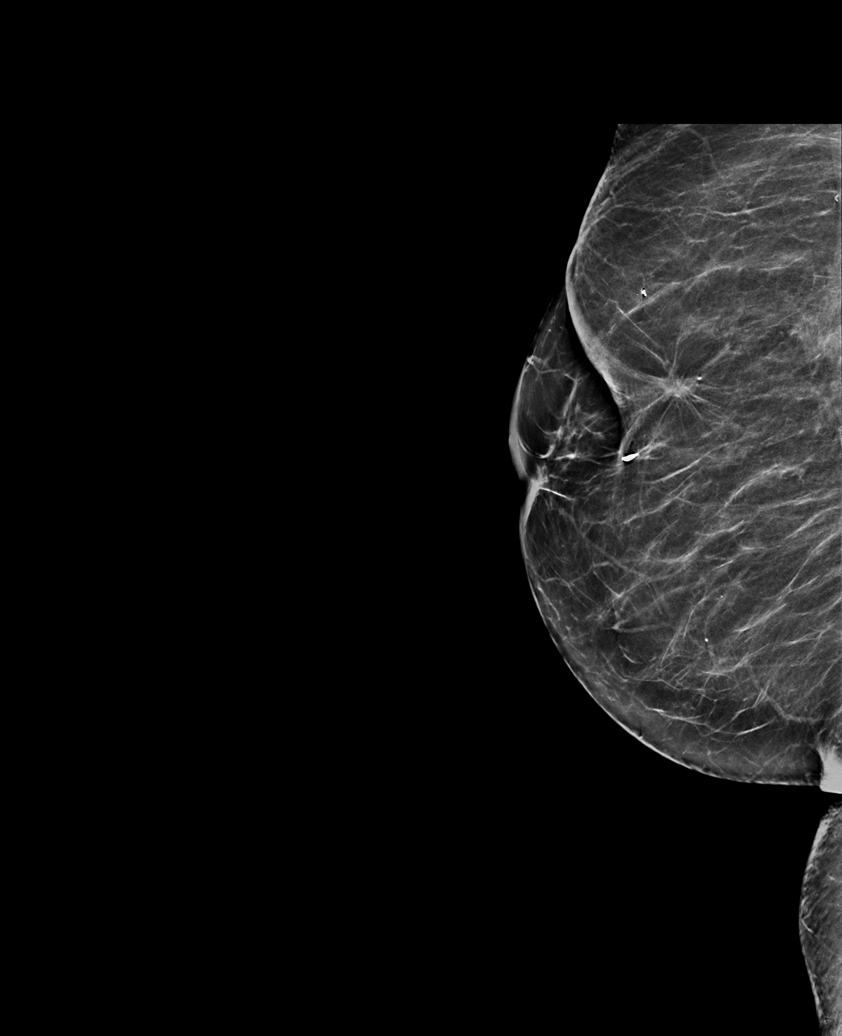

[R MLO synth-2D (2 of 2)]
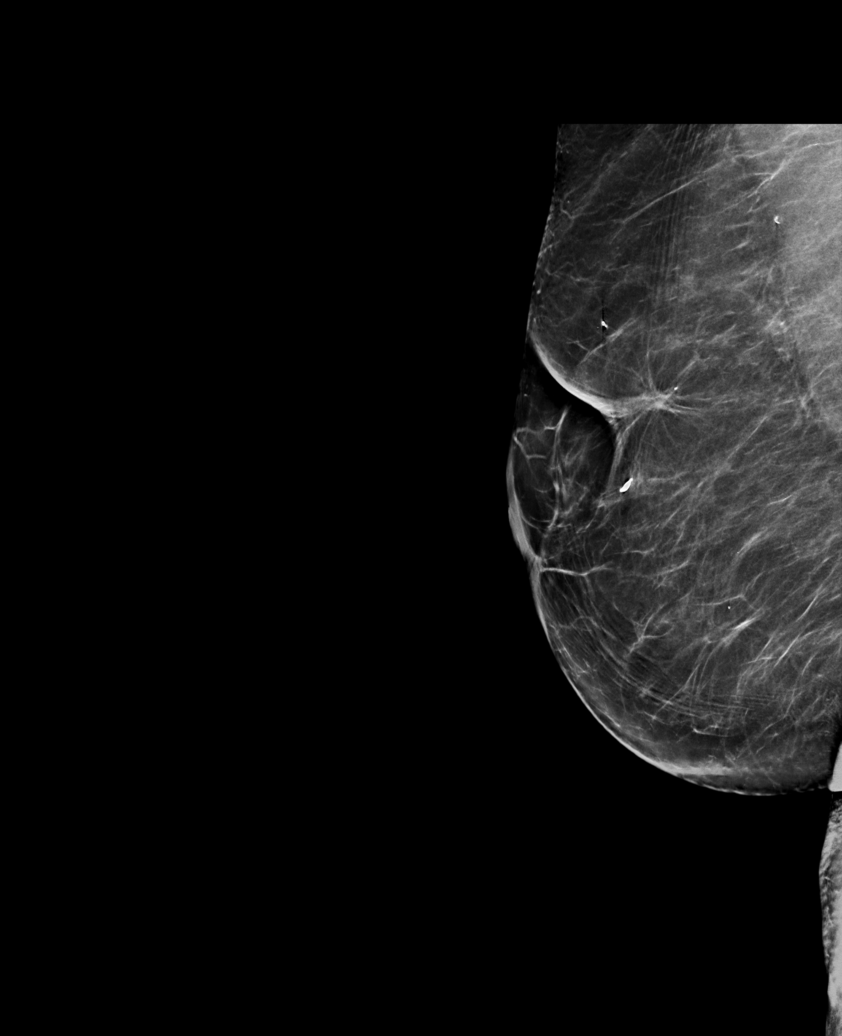

[L CC synth-2D]
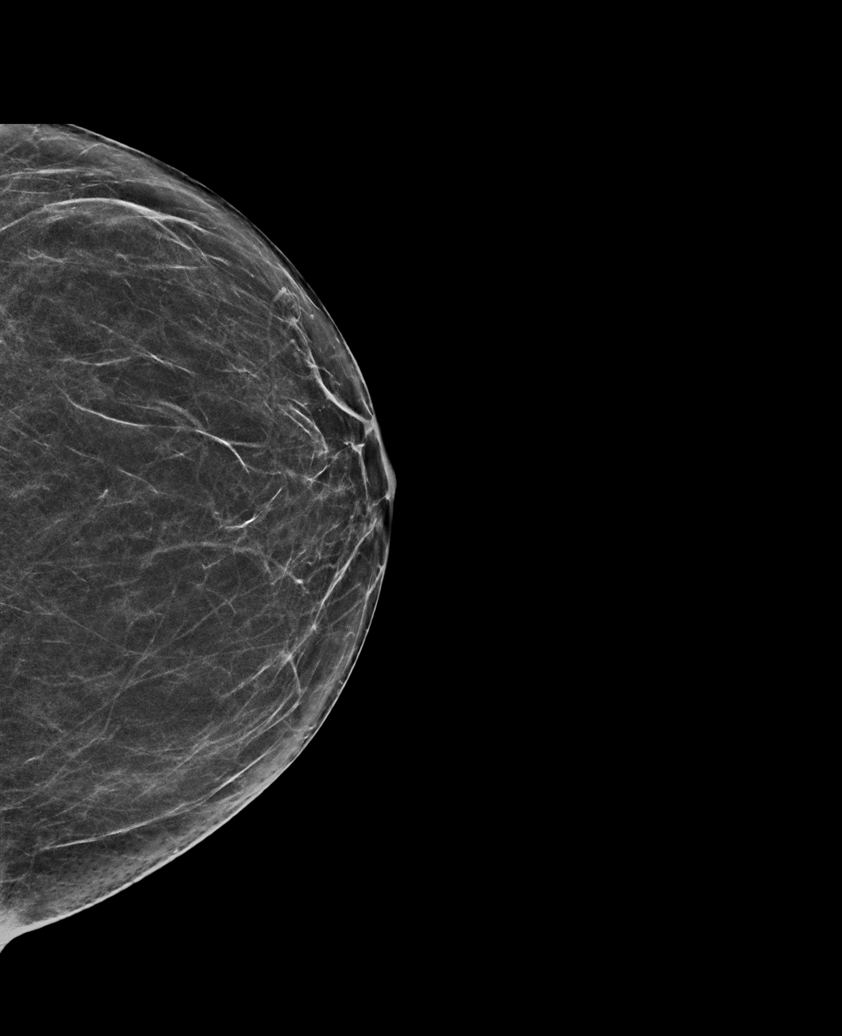

[R CC synth-2D]
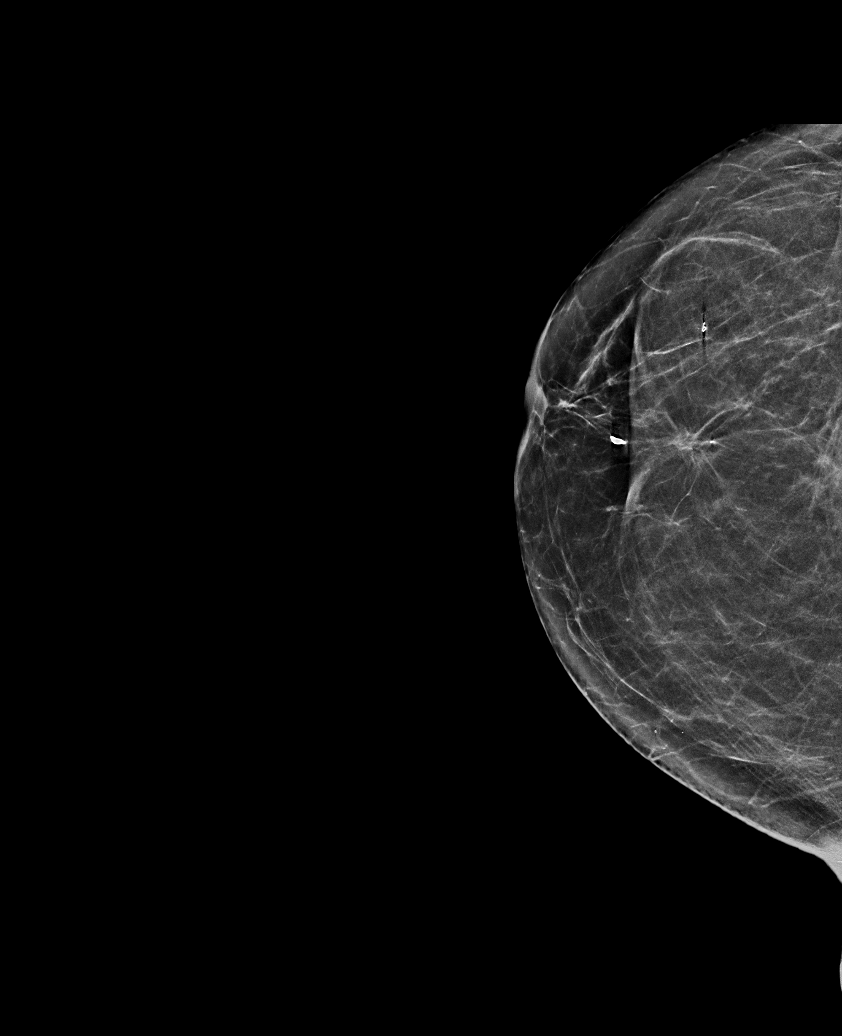

[L MLO synth-2D]
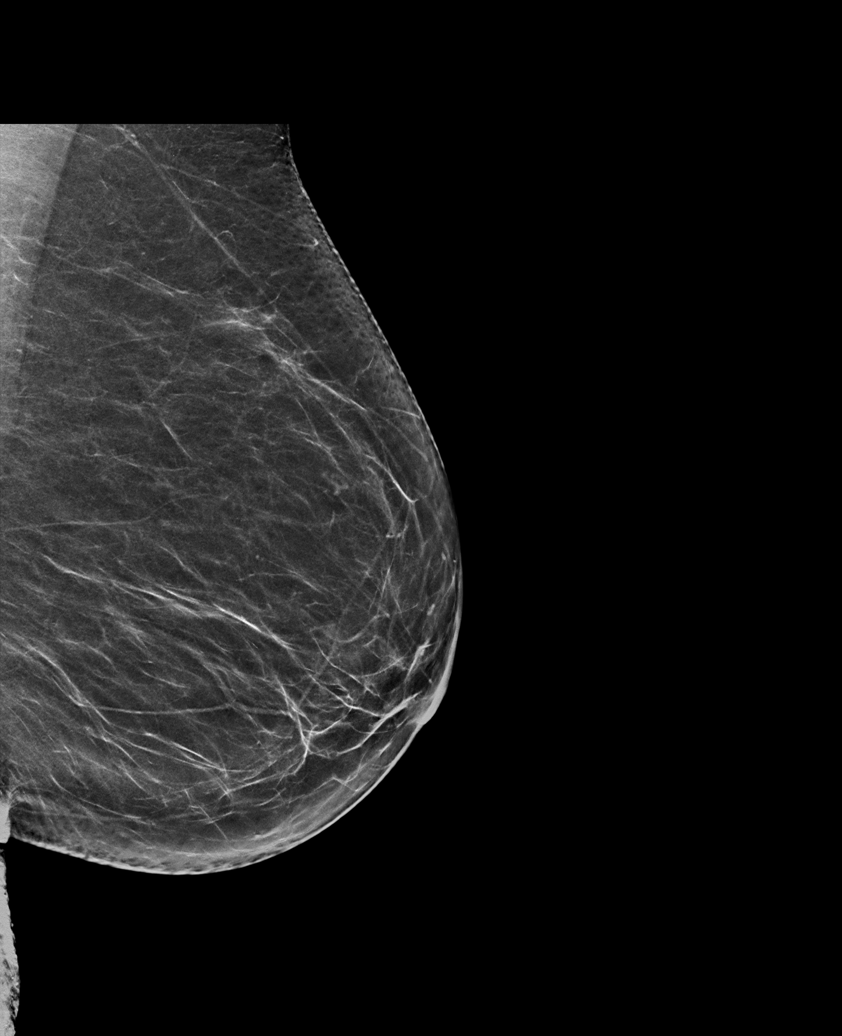

[R CC tomo · tomo slice 31/61.0]
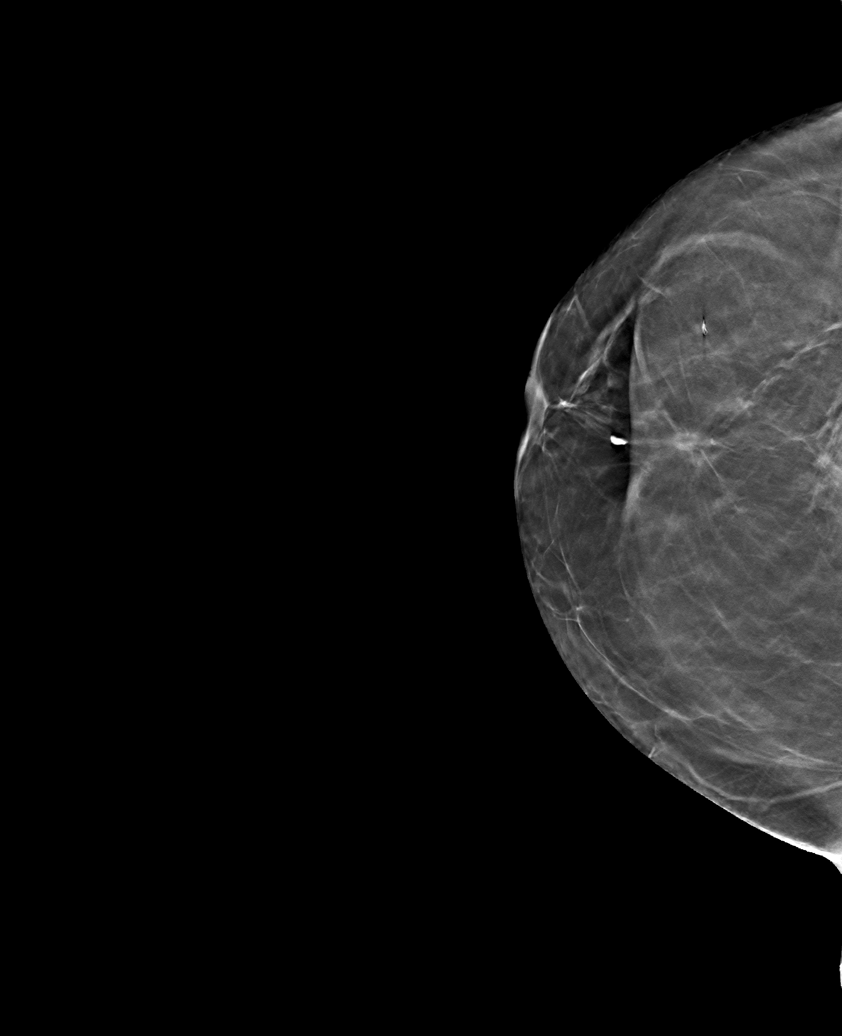

[6 of 30 positions shown; findings below may reference images not displayed]

ACR Breast Density Category b: There are scattered areas of
fibroglandular density.
FINDINGS: There are no findings suspicious for malignancy. Images were
processed with CAD.
IMPRESSION: No mammographic evidence of malignancy. A result letter of this
screening mammogram will be mailed directly to the patient.

RECOMMENDATION:
Screening mammogram in one year. (Code:[id number])

BI-RADS CATEGORY  1: Negative.

## 2020-09-27 ENCOUNTER — Telehealth: Payer: Self-pay

## 2020-09-27 ENCOUNTER — Encounter: Payer: Self-pay | Admitting: Family

## 2020-09-27 ENCOUNTER — Ambulatory Visit (INDEPENDENT_AMBULATORY_CARE_PROVIDER_SITE_OTHER): Payer: Medicare Other | Admitting: Family

## 2020-09-27 DIAGNOSIS — Z79899 Other long term (current) drug therapy: Secondary | ICD-10-CM

## 2020-09-27 DIAGNOSIS — M159 Polyosteoarthritis, unspecified: Secondary | ICD-10-CM

## 2020-09-27 DIAGNOSIS — J449 Chronic obstructive pulmonary disease, unspecified: Secondary | ICD-10-CM

## 2020-09-27 DIAGNOSIS — G8929 Other chronic pain: Secondary | ICD-10-CM

## 2020-09-27 DIAGNOSIS — E785 Hyperlipidemia, unspecified: Secondary | ICD-10-CM

## 2020-09-27 DIAGNOSIS — F1320 Sedative, hypnotic or anxiolytic dependence, uncomplicated: Secondary | ICD-10-CM

## 2020-09-27 DIAGNOSIS — Z85819 Personal history of malignant neoplasm of unspecified site of lip, oral cavity, and pharynx: Secondary | ICD-10-CM

## 2020-09-27 DIAGNOSIS — F331 Major depressive disorder, recurrent, moderate: Secondary | ICD-10-CM

## 2020-09-27 DIAGNOSIS — M542 Cervicalgia: Secondary | ICD-10-CM

## 2020-09-27 DIAGNOSIS — K5900 Constipation, unspecified: Secondary | ICD-10-CM

## 2020-09-27 DIAGNOSIS — M1991 Primary osteoarthritis, unspecified site: Secondary | ICD-10-CM

## 2020-09-27 DIAGNOSIS — N3946 Mixed incontinence: Secondary | ICD-10-CM

## 2020-09-27 DIAGNOSIS — Z853 Personal history of malignant neoplasm of breast: Secondary | ICD-10-CM

## 2020-09-27 DIAGNOSIS — E1169 Type 2 diabetes mellitus with other specified complication: Secondary | ICD-10-CM | POA: Diagnosis not present

## 2020-09-27 DIAGNOSIS — F411 Generalized anxiety disorder: Secondary | ICD-10-CM

## 2020-09-27 DIAGNOSIS — K219 Gastro-esophageal reflux disease without esophagitis: Secondary | ICD-10-CM | POA: Diagnosis not present

## 2020-09-27 DIAGNOSIS — F4310 Post-traumatic stress disorder, unspecified: Secondary | ICD-10-CM

## 2020-09-27 DIAGNOSIS — G4700 Insomnia, unspecified: Secondary | ICD-10-CM

## 2020-09-27 MED ORDER — OXYBUTYNIN CHLORIDE ER 15 MG PO TB24: 15.0000 mg | ORAL_TABLET | Freq: Every day | ORAL | 0 refills | Status: DC

## 2020-09-27 MED ORDER — ALPRAZOLAM 0.5 MG PO TABS: 0.5000 mg | ORAL_TABLET | Freq: Two times a day (BID) | ORAL | 3 refills | Status: DC | PRN

## 2020-09-27 MED ORDER — DICLOFENAC SODIUM 75 MG PO TBEC: 75.0000 mg | DELAYED_RELEASE_TABLET | Freq: Two times a day (BID) | ORAL | 3 refills | Status: DC

## 2020-09-27 MED ORDER — FLUOXETINE HCL 40 MG PO CAPS: 40.0000 mg | ORAL_CAPSULE | Freq: Every day | ORAL | 0 refills | Status: DC

## 2020-09-27 MED ORDER — PILOCARPINE HCL 5 MG PO TABS: 5.0000 mg | ORAL_TABLET | Freq: Two times a day (BID) | ORAL | 1 refills | Status: DC

## 2020-09-27 MED ORDER — OMEPRAZOLE 40 MG PO CPDR: DELAYED_RELEASE_CAPSULE | ORAL | 2 refills | Status: DC

## 2020-09-27 MED ORDER — MIRABEGRON ER 50 MG PO TB24: 50.0000 mg | ORAL_TABLET | Freq: Every day | ORAL | 4 refills | Status: DC

## 2020-09-27 MED ORDER — ATORVASTATIN CALCIUM 20 MG PO TABS: 20.0000 mg | ORAL_TABLET | Freq: Every day | ORAL | 3 refills | Status: DC

## 2020-09-27 MED ORDER — LISINOPRIL 5 MG PO TABS: 5.0000 mg | ORAL_TABLET | Freq: Every day | ORAL | 3 refills | Status: DC

## 2020-09-27 MED ORDER — TRAZODONE HCL 100 MG PO TABS: 200.0000 mg | ORAL_TABLET | Freq: Every day | ORAL | 0 refills | Status: DC

## 2020-09-27 MED ORDER — BREO ELLIPTA 200-25 MCG/INH IN AEPB: INHALATION_SPRAY | RESPIRATORY_TRACT | 0 refills | Status: DC

## 2020-09-27 MED ORDER — METFORMIN HCL 1000 MG PO TABS: 1000.0000 mg | ORAL_TABLET | Freq: Two times a day (BID) | ORAL | 2 refills | Status: DC

## 2020-09-27 MED ORDER — BUPROPION HCL ER (XL) 300 MG PO TB24: 300.0000 mg | ORAL_TABLET | Freq: Every day | ORAL | 0 refills | Status: DC

## 2020-09-27 NOTE — Telephone Encounter (Signed)
Pt needs her arthritis medicine and her alprazolam Rx voided at [facility] and sent to [facility] in [place]. Pt says they dont know what they are doing at [facility] so she just wants to get her meds at [facility] in [place].

## 2020-09-27 NOTE — Telephone Encounter (Signed)
Prescription sent to pharmacy.

## 2020-09-27 NOTE — Progress Notes (Signed)
Virtual Visit via telephone Note Due to COVID-19 pandemic this visit was conducted virtually. This visit type was conducted due to national recommendations for restrictions regarding the COVID-19 Pandemic (e.g. social distancing, sheltering in place) in an effort to limit this patient's exposure and mitigate transmission in our community. All issues noted in this document were discussed and addressed.  A physical exam was not performed with this format.  I connected with [name] on 09/27/20 at 11:38 AM  by telephone and verified that I am speaking with the correct person using two identifiers. [name] is currently located at home and no one is currently with her during visit. The provider, Evelina Dun, FNP is located in their office at time of visit.  I discussed the limitations, risks, security and privacy concerns of performing an evaluation and management service by telephone and the availability of in person appointments. I also discussed with the patient that there may be a patient responsible charge related to this service. The patient expressed understanding and agreed to proceed.   History and Present Illness:  PT presents to the office today for chronic follow up.  Pt history of Left submandibular cancerandOncologists and wasfollowed by them annually.PT had been followed by Urologists for urinary incontinence. Anxiety Presents for follow-up visit. Symptoms include depressed mood, excessive worry, insomnia, irritability, nervous/anxious behavior, palpitations and restlessness. Symptoms occur most days. The severity of symptoms is moderate. The quality of sleep is good.    Depression        This is a chronic problem.  The current episode started more than 1 year ago.   The onset quality is gradual.   The problem occurs intermittently.  The problem has been waxing and waning since onset.  Associated symptoms include fatigue, helplessness, hopelessness, insomnia,  irritable, restlessness and sad.  Past treatments include SSRIs - Selective serotonin reuptake inhibitors.  Compliance with treatment is good.  Past medical history includes anxiety.   Gastroesophageal Reflux She complains of belching and heartburn. This is a chronic problem. The current episode started more than 1 year ago. The problem occurs occasionally. The problem has been waxing and waning. The symptoms are aggravated by certain foods. Associated symptoms include fatigue. Risk factors include obesity. She has tried a PPI for the symptoms.  Hyperlipidemia This is a chronic problem. The current episode started more than 1 year ago. The problem is controlled. Recent lipid tests were reviewed and are normal. Exacerbating diseases include obesity. Current antihyperlipidemic treatment includes statins. The current treatment provides moderate improvement of lipids. Risk factors for coronary artery disease include diabetes mellitus, hypertension, post-menopausal and a sedentary lifestyle.  Arthritis Presents for follow-up visit. She complains of pain and stiffness. The symptoms have been stable. Affected locations include the neck. Her pain is at a severity of 9/10. Associated symptoms include fatigue.  Constipation This is a chronic problem. The current episode started more than 1 year ago. The problem has been waxing and waning since onset. Risk factors include obesity. She has tried laxatives for the symptoms. The treatment provided moderate relief.  Diabetes She presents for her follow-up diabetic visit. She has type 2 diabetes mellitus. Hypoglycemia symptoms include nervousness/anxiousness. Associated symptoms include fatigue. Pertinent negatives for diabetes include no blurred vision. Symptoms are stable. Diabetic complications include peripheral neuropathy. Pertinent negatives for diabetic complications include no CVA, heart disease or nephropathy. Risk factors for coronary artery disease include  dyslipidemia, diabetes mellitus, hypertension, sedentary lifestyle and post-menopausal. Her overall blood glucose  range is 110-130 mg/dl. Eye exam is current.      Review of Systems  Constitutional: Positive for fatigue and irritability.  Eyes: Negative for blurred vision.  Cardiovascular: Positive for palpitations.  Gastrointestinal: Positive for constipation and heartburn.  Musculoskeletal: Positive for arthritis and stiffness.  Psychiatric/Behavioral: Positive for depression. The patient is nervous/anxious and has insomnia.   All other systems reviewed and are negative.    Observations/Objective: No SOB or distress, pt very anxious   Assessment and Plan: [name] comes in today with chief complaint of No chief complaint on file.   Diagnosis and orders addressed:  1. Chronic obstructive pulmonary disease, unspecified COPD type (HCC) - fluticasone furoate-vilanterol (BREO ELLIPTA) 200-25 MCG/INH AEPB; INHALE ONE PUFF INTO LUNGS DAILY  Dispense: 60 each; Refill: 0 - CMP14+EGFR - CBC with Differential/Platelet  2. Gastroesophageal reflux disease, unspecified whether esophagitis present - mirabegron ER ([name]) 50 MG TB24 tablet; Take 1 tablet (50 mg total) by mouth daily.  Dispense: 90 tablet; Refill: 4 - CMP14+EGFR - CBC with Differential/Platelet  3. Type 2 diabetes mellitus with other specified complication, without long-term current use of insulin (HCC) - metFORMIN (GLUCOPHAGE) 1000 MG tablet; Take 1 tablet (1,000 mg total) by mouth 2 (two) times daily.  Dispense: 180 tablet; Refill: 2 - lisinopril (ZESTRIL) 5 MG tablet; Take 1 tablet (5 mg total) by mouth daily.  Dispense: 90 tablet; Refill: 3 - Bayer DCA Hb A1c Waived - CMP14+EGFR - CBC with Differential/Platelet  4. Hyperlipidemia associated with type 2 diabetes mellitus (HCC) - atorvastatin (LIPITOR) 20 MG tablet; Take 1 tablet (20 mg total) by mouth daily.  Dispense: 90 tablet; Refill: 3 - CMP14+EGFR -  CBC with Differential/Platelet  5. Primary osteoarthritis, unspecified site - CMP14+EGFR - CBC with Differential/Platelet  6. Constipation, unspecified constipation type - CMP14+EGFR - CBC with Differential/Platelet  7. ' Discussed we could not increase her medication Encouraged her to make a follow up app tin 4 months before her prescription runs out.  - ALPRAZolam (XANAX) 0.5 MG tablet; Take 1 tablet (0.5 mg total) by mouth 2 (two) times daily as needed. for anxiety  Dispense: 60 tablet; Refill: 3 - buPROPion (WELLBUTRIN XL) 300 MG 24 hr tablet; Take 1 tablet (300 mg total) by mouth daily.  Dispense: 90 tablet; Refill: 0 - FLUoxetine (PROZAC) 40 MG capsule; Take 1 capsule (40 mg total) by mouth daily.  Dispense: 90 capsule; Refill: 0 - CMP14+EGFR - CBC with Differential/Platelet  8. H/O malignant neoplasm of submandibular gland - CMP14+EGFR - CBC with Differential/Platelet  9. History of right breast cancer - CMP14+EGFR - CBC with Differential/Platelet  10. Major depressive disorder, recurrent episode, moderate (HCC) - FLUoxetine (PROZAC) 40 MG capsule; Take 1 capsule (40 mg total) by mouth daily.  Dispense: 90 capsule; Refill: 0 - CMP14+EGFR - CBC with Differential/Platelet  11. Insomnia, unspecified type - traZODone (DESYREL) 100 MG tablet; Take 2 tablets (200 mg total) by mouth at bedtime.  Dispense: 180 tablet; Refill: 0 - CMP14+EGFR - CBC with Differential/Platelet  12. Mixed incontinence urge and stress - oxybutynin (DITROPAN XL) 15 MG 24 hr tablet; Take 1 tablet (15 mg total) by mouth at bedtime.  Dispense: 90 tablet; Refill: 0 - mirabegron ER ([name]) 50 MG TB24 tablet; Take 1 tablet (50 mg total) by mouth daily.  Dispense: 90 tablet; Refill: 4 - CMP14+EGFR - CBC with Differential/Platelet  13. PTSD (post-traumatic stress disorder) - buPROPion (WELLBUTRIN XL) 300 MG 24 hr tablet; Take 1 tablet (300 mg total)  by mouth daily.  Dispense: 90 tablet; Refill: 0 -  CMP14+EGFR - CBC with Differential/Platelet  14. GAD (generalized anxiety disorder) - ALPRAZolam (XANAX) 0.5 MG tablet; Take 1 tablet (0.5 mg total) by mouth 2 (two) times daily as needed. for anxiety  Dispense: 60 tablet; Refill: 3 - buPROPion (WELLBUTRIN XL) 300 MG 24 hr tablet; Take 1 tablet (300 mg total) by mouth daily.  Dispense: 90 tablet; Refill: 0 - FLUoxetine (PROZAC) 40 MG capsule; Take 1 capsule (40 mg total) by mouth daily.  Dispense: 90 capsule; Refill: 0 - CMP14+EGFR - CBC with Differential/Platelet  15. Benzodiazepine dependence (HCC) - ALPRAZolam (XANAX) 0.5 MG tablet; Take 1 tablet (0.5 mg total) by mouth 2 (two) times daily as needed. for anxiety  Dispense: 60 tablet; Refill: 3 - CMP14+EGFR - CBC with Differential/Platelet  16. Controlled substance agreement signed - ALPRAZolam (XANAX) 0.5 MG tablet; Take 1 tablet (0.5 mg total) by mouth 2 (two) times daily as needed. for anxiety  Dispense: 60 tablet; Refill: 3 - CMP14+EGFR - CBC with Differential/Platelet  17. Osteoarthritis of multiple joints, unspecified osteoarthritis type - CMP14+EGFR - CBC with Differential/Platelet  18. Neck pain, chronic -Will change mobic to diclofenac  Referral to PT - Ambulatory referral to Physical Therapy - CMP14+EGFR - CBC with Differential/Platelet   Labs pending Health Maintenance reviewed Diet and exercise encouraged  Follow up plan:      I discussed the assessment and treatment plan with the patient. The patient was provided an opportunity to ask questions and all were answered. The patient agreed with the plan and demonstrated an understanding of the instructions.   The patient was advised to call back or seek an in-person evaluation if the symptoms worsen or if the condition fails to improve as anticipated.  The above assessment and management plan was discussed with the patient. The patient verbalized understanding of and has agreed to the management plan. Patient  is aware to call the clinic if symptoms persist or worsen. Patient is aware when to return to the clinic for a follow-up visit. Patient educated on when it is appropriate to go to the emergency department.   Time call ended:  12:06 pm   I provided 28 minutes of non-face-to-face time during this encounter.    Evelina Dun, FNP

## 2020-09-30 NOTE — Telephone Encounter (Signed)
Left message - rx requested has been sent to the pharmacy.

## 2020-10-16 ENCOUNTER — Other Ambulatory Visit: Payer: Self-pay

## 2020-10-16 DIAGNOSIS — J301 Allergic rhinitis due to pollen: Secondary | ICD-10-CM

## 2020-10-16 DIAGNOSIS — J449 Chronic obstructive pulmonary disease, unspecified: Secondary | ICD-10-CM

## 2020-10-16 MED ORDER — LORATADINE 10 MG PO TABS: 10.0000 mg | ORAL_TABLET | Freq: Every day | ORAL | 1 refills | Status: DC

## 2020-10-16 MED ORDER — BREO ELLIPTA 200-25 MCG/INH IN AEPB: INHALATION_SPRAY | RESPIRATORY_TRACT | 2 refills | Status: DC

## 2020-10-23 ENCOUNTER — Telehealth: Payer: Self-pay

## 2020-10-23 DIAGNOSIS — G4700 Insomnia, unspecified: Secondary | ICD-10-CM

## 2020-10-23 DIAGNOSIS — F411 Generalized anxiety disorder: Secondary | ICD-10-CM

## 2020-10-23 DIAGNOSIS — N3946 Mixed incontinence: Secondary | ICD-10-CM

## 2020-10-23 DIAGNOSIS — F331 Major depressive disorder, recurrent, moderate: Secondary | ICD-10-CM

## 2020-10-23 DIAGNOSIS — F4310 Post-traumatic stress disorder, unspecified: Secondary | ICD-10-CM

## 2020-10-23 DIAGNOSIS — E1169 Type 2 diabetes mellitus with other specified complication: Secondary | ICD-10-CM

## 2020-10-23 DIAGNOSIS — K219 Gastro-esophageal reflux disease without esophagitis: Secondary | ICD-10-CM

## 2020-10-23 MED ORDER — TRAZODONE HCL 100 MG PO TABS: 200.0000 mg | ORAL_TABLET | Freq: Every day | ORAL | 0 refills | Status: DC

## 2020-10-23 MED ORDER — BUPROPION HCL ER (XL) 300 MG PO TB24: 300.0000 mg | ORAL_TABLET | Freq: Every day | ORAL | 0 refills | Status: DC

## 2020-10-23 MED ORDER — OXYBUTYNIN CHLORIDE ER 15 MG PO TB24: 15.0000 mg | ORAL_TABLET | Freq: Every day | ORAL | 0 refills | Status: DC

## 2020-10-23 MED ORDER — DICLOFENAC SODIUM 75 MG PO TBEC
75.0000 mg | DELAYED_RELEASE_TABLET | Freq: Two times a day (BID) | ORAL | 0 refills | Status: DC
Start: 2020-10-23 — End: 2023-03-18

## 2020-10-23 MED ORDER — FLUOXETINE HCL 40 MG PO CAPS
40.0000 mg | ORAL_CAPSULE | Freq: Every day | ORAL | 0 refills | Status: AC
Start: 2020-10-23 — End: ?

## 2020-10-23 MED ORDER — LISINOPRIL 5 MG PO TABS: 5.0000 mg | ORAL_TABLET | Freq: Every day | ORAL | 3 refills | Status: DC

## 2020-10-23 MED ORDER — ATORVASTATIN CALCIUM 20 MG PO TABS: 20.0000 mg | ORAL_TABLET | Freq: Every day | ORAL | 3 refills | Status: AC

## 2020-10-23 MED ORDER — PILOCARPINE HCL 5 MG PO TABS: 5.0000 mg | ORAL_TABLET | Freq: Two times a day (BID) | ORAL | 1 refills | Status: AC

## 2020-10-23 MED ORDER — MIRABEGRON ER 50 MG PO TB24
50.0000 mg | ORAL_TABLET | Freq: Every day | ORAL | 4 refills | Status: AC
Start: 2020-10-23 — End: ?

## 2020-10-23 MED ORDER — METFORMIN HCL 1000 MG PO TABS: 1000.0000 mg | ORAL_TABLET | Freq: Two times a day (BID) | ORAL | 2 refills | Status: AC

## 2020-10-23 NOTE — Telephone Encounter (Signed)
Medications sent to [facility] that were sent on 09/27/20

## 2020-10-23 NOTE — Telephone Encounter (Signed)
Rep from [facility] called to confirm that we received refill requests for all of patients meds. Confirmed that we only received requests for 2 of them (Fluticasone Furoate Vilanterol and Loratadine). Rep said that they received refills on those but are waiting to here back on the rest of pt's meds.   (Looks like some Rx's were sent to [facility] instead of [facility] order)

## 2020-10-24 ENCOUNTER — Telehealth: Payer: Self-pay

## 2020-10-24 ENCOUNTER — Other Ambulatory Visit: Payer: Self-pay

## 2020-10-24 DIAGNOSIS — Z79899 Other long term (current) drug therapy: Secondary | ICD-10-CM

## 2020-10-24 DIAGNOSIS — F411 Generalized anxiety disorder: Secondary | ICD-10-CM

## 2020-10-24 DIAGNOSIS — F1320 Sedative, hypnotic or anxiolytic dependence, uncomplicated: Secondary | ICD-10-CM

## 2020-10-24 MED ORDER — ALPRAZOLAM 0.5 MG PO TABS: 0.5000 mg | ORAL_TABLET | Freq: Two times a day (BID) | ORAL | 2 refills | Status: DC | PRN

## 2020-10-24 MED ORDER — OMEPRAZOLE 40 MG PO CPDR: DELAYED_RELEASE_CAPSULE | ORAL | 2 refills | Status: AC

## 2020-10-24 NOTE — Telephone Encounter (Signed)
Rx printed and signed by [place]. Pt has up to date drug screen and CSA signed.  Rx faxed to pt's [facility] mail order pharmacy.

## 2020-11-04 ENCOUNTER — Other Ambulatory Visit: Payer: Self-pay | Admitting: Family

## 2020-11-04 DIAGNOSIS — J449 Chronic obstructive pulmonary disease, unspecified: Secondary | ICD-10-CM

## 2020-11-25 ENCOUNTER — Other Ambulatory Visit: Payer: Medicare Other

## 2020-11-25 ENCOUNTER — Encounter: Payer: Medicare Other | Admitting: Family

## 2020-12-16 ENCOUNTER — Other Ambulatory Visit: Payer: Self-pay | Admitting: Family

## 2020-12-16 DIAGNOSIS — G4700 Insomnia, unspecified: Secondary | ICD-10-CM

## 2021-02-19 DIAGNOSIS — J029 Acute pharyngitis, unspecified: Secondary | ICD-10-CM | POA: Diagnosis not present

## 2021-02-19 DIAGNOSIS — J329 Chronic sinusitis, unspecified: Secondary | ICD-10-CM | POA: Diagnosis not present

## 2021-02-20 DIAGNOSIS — Z0001 Encounter for general adult medical examination with abnormal findings: Secondary | ICD-10-CM | POA: Diagnosis not present

## 2021-02-20 DIAGNOSIS — E1165 Type 2 diabetes mellitus with hyperglycemia: Secondary | ICD-10-CM | POA: Diagnosis not present

## 2021-03-13 DIAGNOSIS — Z1231 Encounter for screening mammogram for malignant neoplasm of breast: Secondary | ICD-10-CM | POA: Diagnosis not present

## 2021-04-10 DIAGNOSIS — G4700 Insomnia, unspecified: Secondary | ICD-10-CM | POA: Diagnosis not present

## 2021-04-10 DIAGNOSIS — J449 Chronic obstructive pulmonary disease, unspecified: Secondary | ICD-10-CM | POA: Diagnosis not present

## 2021-04-10 DIAGNOSIS — E1165 Type 2 diabetes mellitus with hyperglycemia: Secondary | ICD-10-CM | POA: Diagnosis not present

## 2021-04-16 DIAGNOSIS — J329 Chronic sinusitis, unspecified: Secondary | ICD-10-CM | POA: Diagnosis not present

## 2021-04-16 DIAGNOSIS — J40 Bronchitis, not specified as acute or chronic: Secondary | ICD-10-CM | POA: Diagnosis not present

## 2021-04-16 DIAGNOSIS — J449 Chronic obstructive pulmonary disease, unspecified: Secondary | ICD-10-CM | POA: Diagnosis not present

## 2021-04-16 DIAGNOSIS — G4700 Insomnia, unspecified: Secondary | ICD-10-CM | POA: Diagnosis not present

## 2021-04-16 DIAGNOSIS — E1165 Type 2 diabetes mellitus with hyperglycemia: Secondary | ICD-10-CM | POA: Diagnosis not present

## 2021-04-22 DIAGNOSIS — Z923 Personal history of irradiation: Secondary | ICD-10-CM | POA: Diagnosis not present

## 2021-04-22 DIAGNOSIS — K117 Disturbances of salivary secretion: Secondary | ICD-10-CM | POA: Diagnosis not present

## 2021-04-22 DIAGNOSIS — J343 Hypertrophy of nasal turbinates: Secondary | ICD-10-CM | POA: Diagnosis not present

## 2021-04-22 DIAGNOSIS — H6123 Impacted cerumen, bilateral: Secondary | ICD-10-CM | POA: Diagnosis not present

## 2021-04-22 DIAGNOSIS — H9202 Otalgia, left ear: Secondary | ICD-10-CM | POA: Diagnosis not present

## 2021-04-22 DIAGNOSIS — Z8509 Personal history of malignant neoplasm of other digestive organs: Secondary | ICD-10-CM | POA: Diagnosis not present

## 2021-04-22 DIAGNOSIS — Y842 Radiological procedure and radiotherapy as the cause of abnormal reaction of the patient, or of later complication, without mention of misadventure at the time of the procedure: Secondary | ICD-10-CM | POA: Diagnosis not present

## 2021-04-30 DIAGNOSIS — J40 Bronchitis, not specified as acute or chronic: Secondary | ICD-10-CM | POA: Diagnosis not present

## 2021-04-30 DIAGNOSIS — G4700 Insomnia, unspecified: Secondary | ICD-10-CM | POA: Diagnosis not present

## 2021-04-30 DIAGNOSIS — E1165 Type 2 diabetes mellitus with hyperglycemia: Secondary | ICD-10-CM | POA: Diagnosis not present

## 2021-04-30 DIAGNOSIS — J449 Chronic obstructive pulmonary disease, unspecified: Secondary | ICD-10-CM | POA: Diagnosis not present

## 2021-04-30 DIAGNOSIS — J329 Chronic sinusitis, unspecified: Secondary | ICD-10-CM | POA: Diagnosis not present

## 2021-05-08 ENCOUNTER — Encounter: Payer: Self-pay | Admitting: *Deleted

## 2021-05-08 DIAGNOSIS — M542 Cervicalgia: Secondary | ICD-10-CM | POA: Diagnosis not present

## 2021-05-08 DIAGNOSIS — M545 Low back pain, unspecified: Secondary | ICD-10-CM | POA: Diagnosis not present

## 2021-05-08 DIAGNOSIS — R2689 Other abnormalities of gait and mobility: Secondary | ICD-10-CM | POA: Diagnosis not present

## 2021-05-08 DIAGNOSIS — R29898 Other symptoms and signs involving the musculoskeletal system: Secondary | ICD-10-CM | POA: Diagnosis not present

## 2021-05-14 DIAGNOSIS — N8110 Cystocele, unspecified: Secondary | ICD-10-CM | POA: Diagnosis not present

## 2021-05-30 DIAGNOSIS — M545 Low back pain, unspecified: Secondary | ICD-10-CM | POA: Diagnosis not present

## 2021-05-30 DIAGNOSIS — M542 Cervicalgia: Secondary | ICD-10-CM | POA: Diagnosis not present

## 2021-05-30 DIAGNOSIS — R29898 Other symptoms and signs involving the musculoskeletal system: Secondary | ICD-10-CM | POA: Diagnosis not present

## 2021-05-30 DIAGNOSIS — R2689 Other abnormalities of gait and mobility: Secondary | ICD-10-CM | POA: Diagnosis not present

## 2021-06-05 DIAGNOSIS — M542 Cervicalgia: Secondary | ICD-10-CM | POA: Diagnosis not present

## 2021-06-25 DIAGNOSIS — Z7409 Other reduced mobility: Secondary | ICD-10-CM | POA: Diagnosis not present

## 2021-06-25 DIAGNOSIS — R5383 Other fatigue: Secondary | ICD-10-CM | POA: Diagnosis not present

## 2021-06-25 DIAGNOSIS — R531 Weakness: Secondary | ICD-10-CM | POA: Diagnosis not present

## 2021-06-25 DIAGNOSIS — M542 Cervicalgia: Secondary | ICD-10-CM | POA: Diagnosis not present

## 2021-06-25 DIAGNOSIS — G8929 Other chronic pain: Secondary | ICD-10-CM | POA: Diagnosis not present

## 2021-06-25 DIAGNOSIS — M545 Low back pain, unspecified: Secondary | ICD-10-CM | POA: Diagnosis not present

## 2021-07-02 DIAGNOSIS — R5383 Other fatigue: Secondary | ICD-10-CM | POA: Diagnosis not present

## 2021-07-02 DIAGNOSIS — Z7409 Other reduced mobility: Secondary | ICD-10-CM | POA: Diagnosis not present

## 2021-07-02 DIAGNOSIS — M545 Low back pain, unspecified: Secondary | ICD-10-CM | POA: Diagnosis not present

## 2021-07-02 DIAGNOSIS — M542 Cervicalgia: Secondary | ICD-10-CM | POA: Diagnosis not present

## 2021-07-02 DIAGNOSIS — R531 Weakness: Secondary | ICD-10-CM | POA: Diagnosis not present

## 2021-07-02 DIAGNOSIS — G8929 Other chronic pain: Secondary | ICD-10-CM | POA: Diagnosis not present

## 2021-07-04 DIAGNOSIS — Z7409 Other reduced mobility: Secondary | ICD-10-CM | POA: Diagnosis not present

## 2021-07-04 DIAGNOSIS — M542 Cervicalgia: Secondary | ICD-10-CM | POA: Diagnosis not present

## 2021-07-04 DIAGNOSIS — R5383 Other fatigue: Secondary | ICD-10-CM | POA: Diagnosis not present

## 2021-07-04 DIAGNOSIS — G8929 Other chronic pain: Secondary | ICD-10-CM | POA: Diagnosis not present

## 2021-07-04 DIAGNOSIS — R531 Weakness: Secondary | ICD-10-CM | POA: Diagnosis not present

## 2021-07-04 DIAGNOSIS — M545 Low back pain, unspecified: Secondary | ICD-10-CM | POA: Diagnosis not present

## 2021-07-07 DIAGNOSIS — M545 Low back pain, unspecified: Secondary | ICD-10-CM | POA: Diagnosis not present

## 2021-07-07 DIAGNOSIS — M2569 Stiffness of other specified joint, not elsewhere classified: Secondary | ICD-10-CM | POA: Diagnosis not present

## 2021-07-07 DIAGNOSIS — M436 Torticollis: Secondary | ICD-10-CM | POA: Diagnosis not present

## 2021-07-07 DIAGNOSIS — G8929 Other chronic pain: Secondary | ICD-10-CM | POA: Diagnosis not present

## 2021-07-07 DIAGNOSIS — M542 Cervicalgia: Secondary | ICD-10-CM | POA: Diagnosis not present

## 2021-08-06 DIAGNOSIS — E1165 Type 2 diabetes mellitus with hyperglycemia: Secondary | ICD-10-CM | POA: Diagnosis not present

## 2021-08-06 DIAGNOSIS — R079 Chest pain, unspecified: Secondary | ICD-10-CM | POA: Diagnosis not present

## 2021-08-06 DIAGNOSIS — G4700 Insomnia, unspecified: Secondary | ICD-10-CM | POA: Diagnosis not present

## 2021-08-06 DIAGNOSIS — J329 Chronic sinusitis, unspecified: Secondary | ICD-10-CM | POA: Diagnosis not present

## 2021-08-06 DIAGNOSIS — J449 Chronic obstructive pulmonary disease, unspecified: Secondary | ICD-10-CM | POA: Diagnosis not present

## 2021-08-06 DIAGNOSIS — J40 Bronchitis, not specified as acute or chronic: Secondary | ICD-10-CM | POA: Diagnosis not present

## 2021-08-14 DIAGNOSIS — R079 Chest pain, unspecified: Secondary | ICD-10-CM | POA: Diagnosis not present

## 2021-08-14 DIAGNOSIS — J40 Bronchitis, not specified as acute or chronic: Secondary | ICD-10-CM | POA: Diagnosis not present

## 2021-08-14 DIAGNOSIS — J449 Chronic obstructive pulmonary disease, unspecified: Secondary | ICD-10-CM | POA: Diagnosis not present

## 2021-08-14 DIAGNOSIS — J329 Chronic sinusitis, unspecified: Secondary | ICD-10-CM | POA: Diagnosis not present

## 2021-08-14 DIAGNOSIS — E1165 Type 2 diabetes mellitus with hyperglycemia: Secondary | ICD-10-CM | POA: Diagnosis not present

## 2021-08-14 DIAGNOSIS — G4700 Insomnia, unspecified: Secondary | ICD-10-CM | POA: Diagnosis not present

## 2021-09-02 DIAGNOSIS — E11649 Type 2 diabetes mellitus with hypoglycemia without coma: Secondary | ICD-10-CM | POA: Diagnosis not present

## 2021-09-02 DIAGNOSIS — J449 Chronic obstructive pulmonary disease, unspecified: Secondary | ICD-10-CM | POA: Diagnosis not present

## 2021-09-02 DIAGNOSIS — R079 Chest pain, unspecified: Secondary | ICD-10-CM | POA: Diagnosis not present

## 2021-09-05 DIAGNOSIS — E11649 Type 2 diabetes mellitus with hypoglycemia without coma: Secondary | ICD-10-CM | POA: Diagnosis not present

## 2021-09-05 DIAGNOSIS — R9439 Abnormal result of other cardiovascular function study: Secondary | ICD-10-CM | POA: Diagnosis not present

## 2021-09-05 DIAGNOSIS — R079 Chest pain, unspecified: Secondary | ICD-10-CM | POA: Diagnosis not present

## 2021-09-05 DIAGNOSIS — J449 Chronic obstructive pulmonary disease, unspecified: Secondary | ICD-10-CM | POA: Diagnosis not present

## 2021-09-09 DIAGNOSIS — N3281 Overactive bladder: Secondary | ICD-10-CM | POA: Diagnosis not present

## 2021-09-09 DIAGNOSIS — K5900 Constipation, unspecified: Secondary | ICD-10-CM | POA: Diagnosis not present

## 2021-09-09 DIAGNOSIS — N8110 Cystocele, unspecified: Secondary | ICD-10-CM | POA: Diagnosis not present

## 2021-09-11 DIAGNOSIS — E66811 Obesity, class 1: Secondary | ICD-10-CM | POA: Insufficient documentation

## 2021-09-11 DIAGNOSIS — E785 Hyperlipidemia, unspecified: Secondary | ICD-10-CM | POA: Diagnosis not present

## 2021-09-11 DIAGNOSIS — E1169 Type 2 diabetes mellitus with other specified complication: Secondary | ICD-10-CM | POA: Diagnosis not present

## 2021-09-11 DIAGNOSIS — R0789 Other chest pain: Secondary | ICD-10-CM | POA: Diagnosis not present

## 2021-09-11 DIAGNOSIS — I10 Essential (primary) hypertension: Secondary | ICD-10-CM | POA: Diagnosis not present

## 2021-09-16 ENCOUNTER — Encounter: Payer: Self-pay | Admitting: Internal Medicine

## 2021-09-16 ENCOUNTER — Ambulatory Visit: Payer: Medicare Other | Admitting: Gastroenterology

## 2021-09-16 DIAGNOSIS — R079 Chest pain, unspecified: Secondary | ICD-10-CM | POA: Diagnosis not present

## 2021-09-16 DIAGNOSIS — G4700 Insomnia, unspecified: Secondary | ICD-10-CM | POA: Diagnosis not present

## 2021-09-16 DIAGNOSIS — J449 Chronic obstructive pulmonary disease, unspecified: Secondary | ICD-10-CM | POA: Diagnosis not present

## 2021-09-16 DIAGNOSIS — E1165 Type 2 diabetes mellitus with hyperglycemia: Secondary | ICD-10-CM | POA: Diagnosis not present

## 2021-10-06 DIAGNOSIS — E1165 Type 2 diabetes mellitus with hyperglycemia: Secondary | ICD-10-CM | POA: Diagnosis not present

## 2021-10-06 DIAGNOSIS — J449 Chronic obstructive pulmonary disease, unspecified: Secondary | ICD-10-CM | POA: Diagnosis not present

## 2022-01-06 DIAGNOSIS — N816 Rectocele: Secondary | ICD-10-CM | POA: Diagnosis not present

## 2022-01-06 DIAGNOSIS — N3281 Overactive bladder: Secondary | ICD-10-CM | POA: Diagnosis not present

## 2022-01-08 DIAGNOSIS — I10 Essential (primary) hypertension: Secondary | ICD-10-CM | POA: Diagnosis not present

## 2022-01-08 DIAGNOSIS — I25118 Atherosclerotic heart disease of native coronary artery with other forms of angina pectoris: Secondary | ICD-10-CM | POA: Diagnosis not present

## 2022-01-08 DIAGNOSIS — E785 Hyperlipidemia, unspecified: Secondary | ICD-10-CM | POA: Insufficient documentation

## 2022-02-09 DIAGNOSIS — H43393 Other vitreous opacities, bilateral: Secondary | ICD-10-CM | POA: Diagnosis not present

## 2022-02-11 DIAGNOSIS — Z853 Personal history of malignant neoplasm of breast: Secondary | ICD-10-CM | POA: Diagnosis not present

## 2022-02-11 DIAGNOSIS — E119 Type 2 diabetes mellitus without complications: Secondary | ICD-10-CM | POA: Diagnosis not present

## 2022-02-11 DIAGNOSIS — Z880 Allergy status to penicillin: Secondary | ICD-10-CM | POA: Diagnosis not present

## 2022-02-11 DIAGNOSIS — U071 COVID-19: Secondary | ICD-10-CM | POA: Diagnosis not present

## 2022-02-11 DIAGNOSIS — Z87891 Personal history of nicotine dependence: Secondary | ICD-10-CM | POA: Diagnosis not present

## 2022-02-11 DIAGNOSIS — J449 Chronic obstructive pulmonary disease, unspecified: Secondary | ICD-10-CM | POA: Diagnosis not present

## 2022-02-11 DIAGNOSIS — M47812 Spondylosis without myelopathy or radiculopathy, cervical region: Secondary | ICD-10-CM | POA: Diagnosis not present

## 2022-02-11 DIAGNOSIS — M542 Cervicalgia: Secondary | ICD-10-CM | POA: Diagnosis not present

## 2022-02-11 DIAGNOSIS — R0789 Other chest pain: Secondary | ICD-10-CM | POA: Diagnosis not present

## 2022-02-11 DIAGNOSIS — R059 Cough, unspecified: Secondary | ICD-10-CM | POA: Diagnosis not present

## 2022-03-03 DIAGNOSIS — J0100 Acute maxillary sinusitis, unspecified: Secondary | ICD-10-CM | POA: Diagnosis not present

## 2022-03-03 DIAGNOSIS — R059 Cough, unspecified: Secondary | ICD-10-CM | POA: Diagnosis not present

## 2022-03-11 DIAGNOSIS — J0100 Acute maxillary sinusitis, unspecified: Secondary | ICD-10-CM | POA: Diagnosis not present

## 2022-03-11 DIAGNOSIS — R197 Diarrhea, unspecified: Secondary | ICD-10-CM | POA: Diagnosis not present

## 2022-03-11 DIAGNOSIS — R059 Cough, unspecified: Secondary | ICD-10-CM | POA: Diagnosis not present

## 2022-03-11 DIAGNOSIS — J302 Other seasonal allergic rhinitis: Secondary | ICD-10-CM | POA: Diagnosis not present

## 2022-05-20 DIAGNOSIS — E1165 Type 2 diabetes mellitus with hyperglycemia: Secondary | ICD-10-CM | POA: Diagnosis not present

## 2022-05-20 DIAGNOSIS — I10 Essential (primary) hypertension: Secondary | ICD-10-CM | POA: Diagnosis not present

## 2022-05-20 DIAGNOSIS — J449 Chronic obstructive pulmonary disease, unspecified: Secondary | ICD-10-CM | POA: Diagnosis not present

## 2022-05-20 DIAGNOSIS — R1310 Dysphagia, unspecified: Secondary | ICD-10-CM | POA: Diagnosis not present

## 2022-05-20 DIAGNOSIS — Z86010 Personal history of colonic polyps: Secondary | ICD-10-CM | POA: Diagnosis not present

## 2022-06-22 DIAGNOSIS — Z853 Personal history of malignant neoplasm of breast: Secondary | ICD-10-CM | POA: Diagnosis not present

## 2022-06-22 DIAGNOSIS — E1165 Type 2 diabetes mellitus with hyperglycemia: Secondary | ICD-10-CM | POA: Diagnosis not present

## 2022-06-22 DIAGNOSIS — J449 Chronic obstructive pulmonary disease, unspecified: Secondary | ICD-10-CM | POA: Diagnosis not present

## 2022-06-22 DIAGNOSIS — I10 Essential (primary) hypertension: Secondary | ICD-10-CM | POA: Diagnosis not present

## 2022-06-22 DIAGNOSIS — E7800 Pure hypercholesterolemia, unspecified: Secondary | ICD-10-CM | POA: Diagnosis not present

## 2022-06-22 DIAGNOSIS — E559 Vitamin D deficiency, unspecified: Secondary | ICD-10-CM | POA: Diagnosis not present

## 2022-06-30 DIAGNOSIS — I10 Essential (primary) hypertension: Secondary | ICD-10-CM | POA: Diagnosis not present

## 2022-06-30 DIAGNOSIS — Z86010 Personal history of colonic polyps: Secondary | ICD-10-CM | POA: Diagnosis not present

## 2022-06-30 DIAGNOSIS — E1165 Type 2 diabetes mellitus with hyperglycemia: Secondary | ICD-10-CM | POA: Diagnosis not present

## 2022-06-30 DIAGNOSIS — R197 Diarrhea, unspecified: Secondary | ICD-10-CM | POA: Diagnosis not present

## 2022-06-30 DIAGNOSIS — J449 Chronic obstructive pulmonary disease, unspecified: Secondary | ICD-10-CM | POA: Diagnosis not present

## 2022-06-30 DIAGNOSIS — R1310 Dysphagia, unspecified: Secondary | ICD-10-CM | POA: Diagnosis not present

## 2022-06-30 DIAGNOSIS — B349 Viral infection, unspecified: Secondary | ICD-10-CM | POA: Diagnosis not present

## 2022-07-06 DIAGNOSIS — Z1231 Encounter for screening mammogram for malignant neoplasm of breast: Secondary | ICD-10-CM | POA: Diagnosis not present

## 2022-07-08 DIAGNOSIS — R1310 Dysphagia, unspecified: Secondary | ICD-10-CM | POA: Diagnosis not present

## 2022-07-08 DIAGNOSIS — E1165 Type 2 diabetes mellitus with hyperglycemia: Secondary | ICD-10-CM | POA: Diagnosis not present

## 2022-07-08 DIAGNOSIS — R197 Diarrhea, unspecified: Secondary | ICD-10-CM | POA: Diagnosis not present

## 2022-07-08 DIAGNOSIS — J449 Chronic obstructive pulmonary disease, unspecified: Secondary | ICD-10-CM | POA: Diagnosis not present

## 2022-07-08 DIAGNOSIS — B349 Viral infection, unspecified: Secondary | ICD-10-CM | POA: Diagnosis not present

## 2022-07-08 DIAGNOSIS — Z86010 Personal history of colonic polyps: Secondary | ICD-10-CM | POA: Diagnosis not present

## 2022-07-08 DIAGNOSIS — I10 Essential (primary) hypertension: Secondary | ICD-10-CM | POA: Diagnosis not present

## 2022-07-09 DIAGNOSIS — E785 Hyperlipidemia, unspecified: Secondary | ICD-10-CM | POA: Diagnosis not present

## 2022-07-09 DIAGNOSIS — E1169 Type 2 diabetes mellitus with other specified complication: Secondary | ICD-10-CM | POA: Diagnosis not present

## 2022-07-09 DIAGNOSIS — I10 Essential (primary) hypertension: Secondary | ICD-10-CM | POA: Diagnosis not present

## 2022-07-09 DIAGNOSIS — I25118 Atherosclerotic heart disease of native coronary artery with other forms of angina pectoris: Secondary | ICD-10-CM | POA: Diagnosis not present

## 2022-09-19 DIAGNOSIS — E1165 Type 2 diabetes mellitus with hyperglycemia: Secondary | ICD-10-CM | POA: Diagnosis not present

## 2022-09-19 DIAGNOSIS — J449 Chronic obstructive pulmonary disease, unspecified: Secondary | ICD-10-CM | POA: Diagnosis not present

## 2022-09-19 DIAGNOSIS — I10 Essential (primary) hypertension: Secondary | ICD-10-CM | POA: Diagnosis not present

## 2022-09-28 DIAGNOSIS — E7800 Pure hypercholesterolemia, unspecified: Secondary | ICD-10-CM | POA: Diagnosis not present

## 2022-09-28 DIAGNOSIS — E1165 Type 2 diabetes mellitus with hyperglycemia: Secondary | ICD-10-CM | POA: Diagnosis not present

## 2022-09-28 DIAGNOSIS — I10 Essential (primary) hypertension: Secondary | ICD-10-CM | POA: Diagnosis not present

## 2022-11-02 DIAGNOSIS — H6690 Otitis media, unspecified, unspecified ear: Secondary | ICD-10-CM | POA: Diagnosis not present

## 2022-11-19 DIAGNOSIS — J449 Chronic obstructive pulmonary disease, unspecified: Secondary | ICD-10-CM | POA: Diagnosis not present

## 2022-11-19 DIAGNOSIS — I10 Essential (primary) hypertension: Secondary | ICD-10-CM | POA: Diagnosis not present

## 2022-11-19 DIAGNOSIS — E1165 Type 2 diabetes mellitus with hyperglycemia: Secondary | ICD-10-CM | POA: Diagnosis not present

## 2022-12-10 DIAGNOSIS — J029 Acute pharyngitis, unspecified: Secondary | ICD-10-CM | POA: Diagnosis not present

## 2022-12-10 DIAGNOSIS — H6690 Otitis media, unspecified, unspecified ear: Secondary | ICD-10-CM | POA: Diagnosis not present

## 2022-12-18 DIAGNOSIS — R42 Dizziness and giddiness: Secondary | ICD-10-CM | POA: Diagnosis not present

## 2022-12-18 DIAGNOSIS — H9209 Otalgia, unspecified ear: Secondary | ICD-10-CM | POA: Diagnosis not present

## 2022-12-18 DIAGNOSIS — R030 Elevated blood-pressure reading, without diagnosis of hypertension: Secondary | ICD-10-CM | POA: Diagnosis not present

## 2022-12-18 DIAGNOSIS — R197 Diarrhea, unspecified: Secondary | ICD-10-CM | POA: Diagnosis not present

## 2023-01-05 DIAGNOSIS — R42 Dizziness and giddiness: Secondary | ICD-10-CM | POA: Diagnosis not present

## 2023-01-05 DIAGNOSIS — H9209 Otalgia, unspecified ear: Secondary | ICD-10-CM | POA: Diagnosis not present

## 2023-01-05 DIAGNOSIS — Z1389 Encounter for screening for other disorder: Secondary | ICD-10-CM | POA: Diagnosis not present

## 2023-01-05 DIAGNOSIS — R197 Diarrhea, unspecified: Secondary | ICD-10-CM | POA: Diagnosis not present

## 2023-01-05 DIAGNOSIS — Z85818 Personal history of malignant neoplasm of other sites of lip, oral cavity, and pharynx: Secondary | ICD-10-CM | POA: Diagnosis not present

## 2023-01-05 DIAGNOSIS — R030 Elevated blood-pressure reading, without diagnosis of hypertension: Secondary | ICD-10-CM | POA: Diagnosis not present

## 2023-01-25 DIAGNOSIS — H1032 Unspecified acute conjunctivitis, left eye: Secondary | ICD-10-CM | POA: Diagnosis not present

## 2023-02-05 DIAGNOSIS — E1165 Type 2 diabetes mellitus with hyperglycemia: Secondary | ICD-10-CM | POA: Diagnosis not present

## 2023-02-05 DIAGNOSIS — Z85818 Personal history of malignant neoplasm of other sites of lip, oral cavity, and pharynx: Secondary | ICD-10-CM | POA: Diagnosis not present

## 2023-02-05 DIAGNOSIS — J449 Chronic obstructive pulmonary disease, unspecified: Secondary | ICD-10-CM | POA: Diagnosis not present

## 2023-02-05 DIAGNOSIS — I10 Essential (primary) hypertension: Secondary | ICD-10-CM | POA: Diagnosis not present

## 2023-02-05 DIAGNOSIS — H9209 Otalgia, unspecified ear: Secondary | ICD-10-CM | POA: Diagnosis not present

## 2023-02-05 DIAGNOSIS — Z0001 Encounter for general adult medical examination with abnormal findings: Secondary | ICD-10-CM | POA: Diagnosis not present

## 2023-02-05 DIAGNOSIS — R42 Dizziness and giddiness: Secondary | ICD-10-CM | POA: Diagnosis not present

## 2023-02-05 DIAGNOSIS — B349 Viral infection, unspecified: Secondary | ICD-10-CM | POA: Diagnosis not present

## 2023-02-05 DIAGNOSIS — R197 Diarrhea, unspecified: Secondary | ICD-10-CM | POA: Diagnosis not present

## 2023-02-05 DIAGNOSIS — R1310 Dysphagia, unspecified: Secondary | ICD-10-CM | POA: Diagnosis not present

## 2023-02-08 ENCOUNTER — Other Ambulatory Visit (HOSPITAL_COMMUNITY): Payer: Self-pay | Admitting: Internal Medicine

## 2023-02-08 DIAGNOSIS — M810 Age-related osteoporosis without current pathological fracture: Secondary | ICD-10-CM

## 2023-02-16 DIAGNOSIS — E875 Hyperkalemia: Secondary | ICD-10-CM | POA: Diagnosis not present

## 2023-02-16 DIAGNOSIS — E1165 Type 2 diabetes mellitus with hyperglycemia: Secondary | ICD-10-CM | POA: Diagnosis not present

## 2023-02-18 DIAGNOSIS — I10 Essential (primary) hypertension: Secondary | ICD-10-CM | POA: Diagnosis not present

## 2023-02-18 DIAGNOSIS — E1165 Type 2 diabetes mellitus with hyperglycemia: Secondary | ICD-10-CM | POA: Diagnosis not present

## 2023-02-18 DIAGNOSIS — J449 Chronic obstructive pulmonary disease, unspecified: Secondary | ICD-10-CM | POA: Diagnosis not present

## 2023-03-05 ENCOUNTER — Ambulatory Visit (HOSPITAL_COMMUNITY)
Admission: RE | Admit: 2023-03-05 | Discharge: 2023-03-05 | Disposition: A | Discharge status: Home / Self Care | Payer: 59 | Source: Ambulatory Visit | Attending: Internal Medicine | Admitting: Internal Medicine

## 2023-03-05 DIAGNOSIS — Z780 Asymptomatic menopausal state: Secondary | ICD-10-CM | POA: Diagnosis not present

## 2023-03-05 DIAGNOSIS — M810 Age-related osteoporosis without current pathological fracture: Secondary | ICD-10-CM | POA: Insufficient documentation

## 2023-03-09 NOTE — Progress Notes (Signed)
[place] [address],  [address]   Clinic Day:  03/10/2023  Referring physician: [place] Nation, MD  Patient Care Team: [facility] Nation, MD as PCP - General (Internal Medicine) Gala Romney Cristopher Estimable, MD (Gastroenterology) Derek Jack, MD as Medical Oncologist (Medical Oncology) Brien Mates, RN as Oncology Nurse Navigator (Medical Oncology)   ASSESSMENT & PLAN:   Assessment: 1.  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   2.  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  Pathology showed 2.1 cm IDC, margins negative, Ki-67 of 66%, ER/PR/HER-2 negative, PT 2 PN 0.  One sentinel lymph node was negative. - She underwent chemotherapy followed by radiation.   3.  Social/family history: - She lives with daughter and granddaughter.  Her husband passed away last week.  She was an ex-smoker, quit 28 years ago.  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 - Mother died of metastatic cancer.  Type unknown to the patient.  Plan:  1.  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 - For the last 2 months she is having left-sided neck pain, worse on turning her head to the right.  She is requiring Tylenol for the pain.  She also noticed that she had left ear drainage which was clear 2-3 times when she noticed it on the pillow.  She was given antibiotics for ear infection which seems to have cleared it up.  Last discharge was 3 to 4 weeks ago. - Physical exam today: No evidence of lymphadenopathy.  Left ear canal was normal with wax and no discharge.  Part of the  tympanic membrane visualized was normal. - I have recommended CT soft tissue neck because of the ongoing pain which is not getting better.  We will also check a TSH level.  2.  Mild hypercalcemia: - I have reviewed recent outside labs from a month ago which showed elevated calcium. - She is taking vitamin D 3000 units daily. - We will repeat calcium today and check intact PTH level.  Will also check vitamin D and one 25-hydroxy vitamin D levels.   Orders Placed This Encounter  Procedures   CT SOFT TISSUE NECK W CONTRAST    Standing Status:   Future    Standing Expiration Date:   03/09/2024    Order Specific Question:   If indicated for the ordered procedure, I authorize the administration of contrast media per Radiology protocol    Answer:   Yes    Order Specific Question:   Does the patient have a contrast media/X-ray dye allergy?    Answer:   No    Order Specific Question:   Preferred imaging location?    Answer:   [facility]    Order Specific Question:   Release to patient    Answer:   Immediate [1]   CBC with Differential    Standing Status:   Future    Number of Occurrences:   1    Standing Expiration Date:   03/09/2024   Comprehensive metabolic panel    Standing Status:   Future  Number of Occurrences:   1    Standing Expiration Date:   03/09/2024   TSH    Standing Status:   Future    Number of Occurrences:   1    Standing Expiration Date:   03/09/2024   PTH, intact and calcium    Standing Status:   Future    Number of Occurrences:   1    Standing Expiration Date:   03/09/2024   Vitamin D 25 hydroxy    Standing Status:   Future    Number of Occurrences:   1    Standing Expiration Date:   03/09/2024   Vitamin D 1,25 dihydroxy    Standing Status:   Future    Number of Occurrences:   1    Standing Expiration Date:   03/09/2024   Ambulatory referral to Social Work    Referral Priority:   Routine    Referral Type:   Consultation    Referral Reason:   Specialty  Services Required    Number of Visits Requested:   1      I,Katie Daubenspeck,acting as a Education administrator for Derek Jack, MD.,have documented all relevant documentation on the behalf of Derek Jack, MD,as directed by  Derek Jack, MD while in the presence of Derek Jack, MD.   I, Derek Jack MD, have reviewed the above documentation for accuracy and completeness, and I agree with the above.   Derek Jack, MD   3/20/20244:02 PM  CHIEF COMPLAINT/PURPOSE OF CONSULT:   Diagnosis: left submandibular adenoid cystic carcinoma and right breast cancer   Cancer Staging  No matching staging information was found for the patient.   Prior Therapy: Left submandibular resection and XRT  Current Therapy:  observation   HISTORY OF PRESENT ILLNESS:   Left submandibular adenoid cystic carcinoma-- She had this resected on 03/05/2015 at [facility]. Pathology showed 1.5 cm adenoid cystic carcinoma, grade 2, moderately differentiated, margins uninvolved by carcinoma, positive perineural invasion, negative lymphovascular invasion, PT1PNX. She underwent radiation therapy for 6 weeks.   Right breast cancer-- underwent lumpectomy and sentinel lymph node biopsy on 12/01/2006. Pathology showed 2.1 cm IDC, negative margins. ER/PR/HER-2 receptors were also negative. She had to receive chemotherapy followed by radiation.   Oncology History   No history exists.      [name] is a 67 y.o. female presenting to clinic today for follow up and re-evaluation of her history of left submandibular adenoid cystic carcinoma and right breast cancer at the request of Dr. Stana Bunting ([place]). I previously saw the patient in 07/2019, but she was subsequently lost to follow up.  She has not undergone any restaging scans since 07/2019 or followed up with ENT for her history of salivary cancer.  Her most recent screening mammogram was performed on  07/06/22 with [facility], and results were negative.  Today, she states that she is doing well overall. Her appetite level is at 40%. Her energy level is at 40%. She reports left neck pain which started about 2 months ago.  She continues to have dry mouth and decrease in taste.  She lost about 8 to 10 pounds in the last 6 months.  She has occasional dizziness and almost falls.  Reported clear discharge from the left ear 2-3 times since the neck pain started.  She was treated with eardrops for infection.   PAST MEDICAL HISTORY:   Past Medical History: Past Medical History:  Diagnosis Date   Adenoid cystic carcinoma of submandibular  [facility]) dx 2016---  oncologist-  dr Eppie Gibson [facility] center)   Left submandibular salivary gland--  T1 N0 M0,  Grade 2 w/ perineural invasion  s/p  resection 03-05-2015  and Radiation therapy 04-29-2015 to 06-11-2015   Allergic rhinitis    Anemia    Arthritis    Bipolar disorder (HCC)    [facility]   Breast cancer (HCC)    COPD (chronic obstructive pulmonary disease) (HCC)    Cystocele    Decreased sense of taste    secondary to radiation   GAD (generalized anxiety disorder)    GERD (gastroesophageal reflux disease)    Headache    Hemorrhoid    INTERNAL AND EXTERNAL     History of adenomatous polyp of colon    History of breast cancer dx 2007--- oncologist-- dr Lollie Marrow--- no recurrence   Right breast DCIS ,  Stage 2 (T2 N0 M0) --  s/p  right partial mastecotmy w/ sln dissection,  chemotherapy complete 02-01-2007,  radiation completed 06-15-2007   History of esophageal dilatation    History of esophagitis    History of external beam radiation therapy    04-27-2007 to 06-15-2007 , right breast- 5040cGy in 28 sessions and boost 1200 cGy in 6 sessions/   04-29-2015 to 06-11-2015 , left neck and skull base -- 60 Gy in 30 fractions   History of hiatal hernia    History of panic attacks    History of perforation of tympanic membrane    AFTER ACUTE OTITIS  MEDIA 2016-- RESOLVED   Hypertriglyceridemia    ?   Loss of teeth due to extraction    secondary to radiation therapy---  x8 or 9   Mouth dryness    secondary to radiation   Neck pain    resuidaul neck pain from radiation   Peripheral neuropathy    Personal history of chemotherapy    Personal history of radiation therapy    TMJ (temporomandibular joint disorder)    Type 2 diabetes mellitus (HCC)    Urge and stress incontinence    Wears glasses     Surgical History: Past Surgical History:  Procedure Laterality Date   COLONOSCOPY  03/30/2012   RMR: rectal and colonic polyps -removed as described above. Mellanosis coli   COLONOSCOPY WITH PROPOFOL N/A 05/25/2016   Procedure: COLONOSCOPY WITH PROPOFOL;  Surgeon: Daneil Dolin, MD;  [facility];  Service: Endoscopy;  Laterality: N/A;  1400    ECTOPIC PREGNANCY SURGERY     ESOPHAGOGASTRODUODENOSCOPY (EGD) WITH PROPOFOL N/A 05/25/2016   Rourk: small hh, status post dilation of Schatzki ring, normal colon. Next colonoscopy June 2022   EXCISION / BIOPSY BREAST / NIPPLE / DUCT     INTERSTIM IMPLANT PLACEMENT N/A 08/11/2016   Serial Number [id number] H.Procedure: INTERSTIM IMPLANT FIRST STAGE;  Surgeon: Bjorn Loser, MD;  Location: [facility];  Service: Urology;  Laterality: N/A;   INTERSTIM IMPLANT PLACEMENT N/A 08/11/2016   Procedure: Barrie Lyme IMPLANT SECOND STAGE IMPEDANCE CHECK;  Surgeon: Bjorn Loser, MD;  Location[facility];  Service: Urology;  Laterality: N/A;   MALONEY DILATION N/A 05/25/2016   Procedure: Venia Minks DILATION;  Surgeon: Daneil Dolin, MD;  [facility];  Service: Endoscopy;  Laterality: N/A;   PARTIAL MASTECTOMY WITH AXILLARY SENTINEL LYMPH NODE BIOPSY Right 12/01/2006   and Right axilla node dissection x1   PORT-A-CATH PLACEMENT AND REMOVAL  12-31-2006/  05-13-2007   SUBMANDIBULAR GLAND EXCISION Left 03/05/2015  adenoid cystic carcinoma   TRANSTHORACIC  ECHOCARDIOGRAM  12/29/2006   normal echo, ef 55%   TUBAL LIGATION  yrs ago   VAGINAL HYSTERECTOMY  10-26-2007  dr Elonda Husky   w/ Anterior Repair with intraspinous graft and vagainal vault suspension    Social History: Social History   Socioeconomic History   Marital status: Married    Spouse name: Not on file   Number of children: 3   Years of education: Not on file   Highest education level: Not on file  Occupational History   Not on file  Tobacco Use   Smoking status: Former    Packs/day: 1.50    Years: 25.00    Additional pack years: 0.00    Total pack years: 37.50    Types: Cigarettes    Quit date: 01/09/1996    Years since quitting: 27.1   Smokeless tobacco: Never  Vaping Use   Vaping Use: Never used  Substance and Sexual Activity   Alcohol use: No    Alcohol/week: 0.0 standard drinks of alcohol    Comment: 03-23-2017 per pt no but in the past. stopped 1983   Drug use: No    Comment: 03-23-17 per pt no but stopped in her 20s.   Sexual activity: Never  Other Topics Concern   Not on file  Social History Narrative   Not on file   Social Determinants of Health   Financial Resource Strain: Low Risk  (08/01/2019)   Overall Financial Resource Strain (CARDIA)    Difficulty of Paying Living Expenses: Not very hard  Food Insecurity: No Food Insecurity (03/10/2023)   Hunger Vital Sign    Worried About Running Out of Food in the Last Year: Never true    Ran Out of Food in the Last Year: Never true  Transportation Needs: No Transportation Needs (03/10/2023)   PRAPARE - Hydrologist (Medical): No    Lack of Transportation (Non-Medical): No  Physical Activity: Inactive (08/01/2019)   Exercise Vital Sign    Days of Exercise per Week: 0 days    Minutes of Exercise per Session: 0 min  Stress: Stress Concern Present (08/01/2019)   [place]    Feeling of Stress : To some extent  Social  Connections: Moderately Integrated (08/01/2019)   Social Connection and Isolation Panel [NHANES]    Frequency of Communication with Friends and Family: More than three times a week    Frequency of Social Gatherings with Friends and Family: Twice a week    Attends Religious Services: 1 to 4 times per year    Active Member of [employer] or Organizations: No    Attends Archivist Meetings: Never    Marital Status: Married  Human resources officer Violence: Not At Risk (03/10/2023)   Humiliation, Afraid, Rape, and Kick questionnaire    Fear of Current or Ex-Partner: No    Emotionally Abused: No    Physically Abused: No    Sexually Abused: No    Family History: Family History  Problem Relation Age of Onset   Diabetes Mother    Hypertension Mother    Cancer Mother        unknown primary   Bipolar disorder Mother    Colon cancer Paternal Grandmother 86       deceased   Diabetes Daughter 7       type 1   Depression Sister    Bipolar disorder Paternal Grandfather  Depression Sister    Liver disease Neg Hx     Current Medications:  Current Outpatient Medications:    ACCU-CHEK SOFTCLIX LANCETS lancets, USE TO TEST BLOOD GLUCOSE THREE TIMES DAILY AS DIRECTED, Disp: 100 each, Rfl: 2   ALPRAZolam (XANAX) 0.5 MG tablet, Take 1 tablet (0.5 mg total) by mouth 2 (two) times daily as needed. for anxiety, Disp: 60 tablet, Rfl: 2   aspirin (ASPIRIN LOW DOSE) 81 MG EC tablet, Take 1 tablet (81 mg total) by mouth daily. (Needs to be seen before next refill), Disp: 90 tablet, Rfl: 0   atorvastatin (LIPITOR) 20 MG tablet, Take 1 tablet (20 mg total) by mouth daily., Disp: 90 tablet, Rfl: 3   buPROPion (WELLBUTRIN XL) 300 MG 24 hr tablet, Take 1 tablet (300 mg total) by mouth daily., Disp: 90 tablet, Rfl: 0   canagliflozin (INVOKANA) 100 MG TABS tablet, Take by mouth., Disp: , Rfl:    cholecalciferol 25 MCG (1000 UT) tablet, Take by mouth., Disp: , Rfl:    diclofenac (VOLTAREN) 75 MG EC tablet, Take  1 tablet (75 mg total) by mouth 2 (two) times daily., Disp: 180 tablet, Rfl: 0   FLUoxetine (PROZAC) 40 MG capsule, Take 1 capsule (40 mg total) by mouth daily., Disp: 90 capsule, Rfl: 0   fluticasone (FLONASE) 50 MCG/ACT nasal spray, INSTILL 2 SPRAYS INTO EACH NOSTRIL EVERY DAY, Disp: 48 g, Rfl: 5   fluticasone furoate-vilanterol (BREO ELLIPTA) 200-25 MCG/INH AEPB, INHALE 1 PUFF INTO THE LUNGS DAILY, Disp: 60 each, Rfl: 2   lisinopril (ZESTRIL) 5 MG tablet, Take 1 tablet (5 mg total) by mouth daily., Disp: 90 tablet, Rfl: 3   loratadine (CLARITIN) 10 MG tablet, Take 1 tablet (10 mg total) by mouth daily., Disp: 90 tablet, Rfl: 1   meclizine (ANTIVERT) 12.5 MG tablet, Take 12.5 mg by mouth 3 (three) times daily., Disp: , Rfl:    metFORMIN (GLUCOPHAGE) 1000 MG tablet, Take 1 tablet (1,000 mg total) by mouth 2 (two) times daily., Disp: 180 tablet, Rfl: 2   mirabegron ER ([name]) 50 MG TB24 tablet, Take 1 tablet (50 mg total) by mouth daily., Disp: 90 tablet, Rfl: 4   nitroGLYCERIN (NITROSTAT) 0.4 MG SL tablet, PLACE 1 TABLET UNDER THE TONGUE EVERY 5 MINUTES UP TO 3 DOSES AS NEEDED FOR CHEST PAIN., Disp: , Rfl:    omeprazole (PRILOSEC) 40 MG capsule, Take one capsule 30 minutes before breakfast, Disp: 90 capsule, Rfl: 2   oxybutynin (DITROPAN XL) 15 MG 24 hr tablet, Take 1 tablet (15 mg total) by mouth at bedtime., Disp: 90 tablet, Rfl: 0   oxycodone-acetaminophen (PERCOCET) 2.5-325 MG tablet, Take by mouth., Disp: , Rfl:    pilocarpine (SALAGEN) 5 MG tablet, Take 1 tablet (5 mg total) by mouth 2 (two) times daily. Needs to be seen before next refill, Disp: 180 tablet, Rfl: 1   traZODone (DESYREL) 100 MG tablet, TAKE 2 TABLETS(200 MG) BY MOUTH AT BEDTIME, Disp: 180 tablet, Rfl: 0   VENTOLIN HFA 108 (90 Base) MCG/ACT inhaler, INHALE 2 PUFFS INTO THE LUNGS EVERY 6 (SIX) HOURS AS NEEDED FOR WHEEZING OR SHORTNESS OF BREATH., Disp: 18 g, Rfl: 1   Allergies: Allergies  Allergen Reactions   Bee Venom  Hives, Swelling and Other (See Comments)    Bee stings  Bee stings Bee stings    Penicillins Anaphylaxis and Swelling    Has patient had a PCN reaction causing immediate rash, facial/tongue/throat swelling, SOB or lightheadedness with hypotension: Yes Has  patient had a PCN reaction causing severe rash involving mucus membranes or skin necrosis: No Has patient had a PCN reaction that required hospitalization Yes Has patient had a PCN reaction occurring within the last 10 years: No If all of the above answers are "NO", then may proceed with Cephalosporin use.     REVIEW OF SYSTEMS:   Review of Systems  Constitutional:  Negative for chills, fatigue and fever.  HENT:   Negative for lump/mass, mouth sores, nosebleeds, sore throat and trouble swallowing.   Eyes:  Negative for eye problems.  Respiratory:  Positive for cough and shortness of breath.   Cardiovascular:  Positive for chest pain. Negative for leg swelling and palpitations.  Gastrointestinal:  Positive for diarrhea. Negative for abdominal pain, constipation, nausea and vomiting.  Genitourinary:  Negative for bladder incontinence, difficulty urinating, dysuria, frequency, hematuria and nocturia.   Musculoskeletal:  Positive for neck pain. Negative for arthralgias, back pain, flank pain and myalgias.  Skin:  Negative for itching and rash.  Neurological:  Positive for dizziness. Negative for headaches and numbness.  Hematological:  Does not bruise/bleed easily.  Psychiatric/Behavioral:  Positive for depression. Negative for sleep disturbance and suicidal ideas. The patient is nervous/anxious.   All other systems reviewed and are negative.    VITALS:   Blood pressure 109/75, pulse 87, temperature 98.2 F (36.8 C), temperature source Oral, resp. rate 18, height 5' 7.5" (1.715 m), weight 196 lb (88.9 kg), SpO2 97 %.  Wt Readings from Last 3 Encounters:  03/10/23 196 lb (88.9 kg)  04/23/20 218 lb 3.2 oz (99 kg)  10/30/19 219 lb  (99.3 kg)    Body mass index is 30.24 kg/m.  Performance status (ECOG): 1 - Symptomatic but completely ambulatory  PHYSICAL EXAM:   Physical Exam Vitals and nursing note reviewed. Exam conducted with a chaperone present.  Constitutional:      Appearance: Normal appearance.  Cardiovascular:     Rate and Rhythm: Normal rate and regular rhythm.     Pulses: Normal pulses.     Heart sounds: Normal heart sounds.  Pulmonary:     Effort: Pulmonary effort is normal.     Breath sounds: Normal breath sounds.  Abdominal:     Palpations: Abdomen is soft. There is no hepatomegaly, splenomegaly or mass.     Tenderness: There is no abdominal tenderness.  Musculoskeletal:     Right lower leg: No edema.     Left lower leg: No edema.  Lymphadenopathy:     Cervical: No cervical adenopathy.     Right cervical: No superficial, deep or posterior cervical adenopathy.    Left cervical: No superficial, deep or posterior cervical adenopathy.     Upper Body:     Right upper body: No supraclavicular or axillary adenopathy.     Left upper body: No supraclavicular or axillary adenopathy.  Neurological:     General: No focal deficit present.     Mental Status: She is alert and oriented to person, place, and time.  Psychiatric:        Mood and Affect: Mood normal.        Behavior: Behavior normal.     LABS:      Latest Ref Rng & Units 04/23/2020    1:20 PM 08/02/2019    3:42 PM 02/28/2019   11:35 AM  CBC  WBC 3.4 - 10.8 x10E3/uL 6.2  5.1  5.4   Hemoglobin 11.1 - 15.9 g/dL 12.4  11.9  11.7   Hematocrit  34.0 - 46.6 % 38.5  38.0  34.6   Platelets 150 - 450 x10E3/uL 317  314  318       Latest Ref Rng & Units 04/23/2020    1:20 PM 08/02/2019    3:42 PM 02/28/2019   11:35 AM  CMP  Glucose 65 - 99 mg/dL 130  135  181   BUN 8 - 27 mg/dL 16  17  14    Creatinine 0.57 - 1.00 mg/dL 1.02  0.96  0.98   Sodium 134 - 144 mmol/L 136  136  138   Potassium 3.5 - 5.2 mmol/L 4.8  4.7  4.6   Chloride 96 - 106  mmol/L 98  101  98   CO2 20 - 29 mmol/L 25  27  21    Calcium 8.7 - 10.3 mg/dL 9.9  9.3  9.3   Total Protein 6.0 - 8.5 g/dL 6.6  7.1  6.5   Total Bilirubin 0.0 - 1.2 mg/dL 0.3  0.4  <0.2   Alkaline Phos 39 - 117 IU/L 64  64  83   AST 0 - 40 IU/L 16  15  14    ALT 0 - 32 IU/L 14  15  13       No results found for: "CEA1", "CEA" / No results found for: "CEA1", "CEA" No results found for: "PSA1" No results found for: "[id number]" No results found for: "CAN125"  No results found for: "TOTALPROTELP", "ALBUMINELP", "A1GS", "A2GS", "BETS", "BETA2SER", "GAMS", "MSPIKE", "SPEI" No results found for: "TIBC", "FERRITIN", "IRONPCTSAT" No results found for: "LDH"   STUDIES:   DG BONE DENSITY (DXA)  Result Date: 03/05/2023 EXAM: DUAL X-RAY ABSORPTIOMETRY (DXA) FOR BONE MINERAL DENSITY IMPRESSION: Your patient [name] completed a BMD test on 03/05/2023 using the [employer] (software version: 14.10) manufactured by [employer]. The following summarizes the results of our evaluation. Technologist: [facility] PATIENT BIOGRAPHICAL: Name: Yoyo, [place] Patient ID: [id number] Birth Date: November 01, 1956 Height: 67.0 in. Gender: Female Exam Date: 03/05/2023 Weight: 200.0 lbs. Indications: Caucasian, Height Loss, Hx Breast Ca, Low Calcium Intake, Post Menopausal Fractures: Treatments: Asprin, Vitamin D DENSITOMETRY RESULTS: Site      Region     Measured Date Measured Age WHO Classification Young Adult T-score BMD         %Change vs. Previous Significant Change (*) [facility] Spine L1-L2 03/05/2023 67.0 Normal 0.4 1.212 g/cm2 - - DualFemur Neck Left 03/05/2023 68.0 Normal -0.3 0.998 g/cm2 - - DualFemur Total Mean 2/05/2023 68.0 Normal 0.4 1.064 g/cm2 - - ASSESSMENT: The BMD measured at Femur Neck Left is 0.998 g/cm2 with a T-score of -0.3. This patient is considered normal according to [facility]) criteria. The scan quality is good. L3 and L4 were excluded due to advanced degenerative  changes. World Pharmacologist [facility]) criteria for post-menopausal, Caucasian Women: Normal:       T-score at or above -1 SD Osteopenia:   T-score between -1 and -2.5 SD Osteoporosis: T-score at or below -2.5 SD RECOMMENDATIONS: 1. All patients should optimize calcium and vitamin D intake. 2. Consider FDA-approved medical therapies in postmenopausal women and med aged 110 years and older, based on the following: a. A hip or vertebral (clinical or morphometric) fracture b. T-score< -2.5 at the femoral neck or spine after appropriate evaluation to exclude secondary causes c. Low bone mass (T-score between -1.0 and -2.5 at the femoral neck or spine) and a 10-year probability of a hip fracture > 3% or  a 10-year probability of a major osteoporosis-related fracture > 20% based on the [place]-adapted WHO algorithm d. Clinician judgment and/or patient preferences may indicate treatment for people with 10-year fracture probabilities above or below these levels FOLLOW-UP: Patients with diagnosis of osteoporsis or at high risk for fracture should have regular bone mineral density tests. For patients eligible for Medicare, routine testing is allowed once every 2 years. The testing frequency can be increased to one year for patients who have rapidly progressing disease, those who are receiving or discontinuing medical therapy to restore bone mass, or have additional risk factors. I have reviewed this report, and agree with the above findings. [facility] Electronically Signed   By: Zerita Boers M.D.   On: 03/05/2023 13:51

## 2023-03-10 ENCOUNTER — Inpatient Hospital Stay: Payer: 59

## 2023-03-10 ENCOUNTER — Inpatient Hospital Stay: Payer: 59 | Attending: Hematology | Admitting: Hematology

## 2023-03-10 VITALS — BP 109/75 | HR 87 | Temp 98.2°F | Resp 18 | Ht 67.5 in | Wt 196.0 lb

## 2023-03-10 DIAGNOSIS — M542 Cervicalgia: Secondary | ICD-10-CM | POA: Diagnosis not present

## 2023-03-10 DIAGNOSIS — Z833 Family history of diabetes mellitus: Secondary | ICD-10-CM

## 2023-03-10 DIAGNOSIS — Z800 Family history of malignant neoplasm of digestive organs: Secondary | ICD-10-CM | POA: Diagnosis not present

## 2023-03-10 DIAGNOSIS — Z79899 Other long term (current) drug therapy: Secondary | ICD-10-CM

## 2023-03-10 DIAGNOSIS — Z853 Personal history of malignant neoplasm of breast: Secondary | ICD-10-CM

## 2023-03-10 DIAGNOSIS — Z8589 Personal history of malignant neoplasm of other organs and systems: Secondary | ICD-10-CM | POA: Diagnosis not present

## 2023-03-10 DIAGNOSIS — Z87891 Personal history of nicotine dependence: Secondary | ICD-10-CM

## 2023-03-10 DIAGNOSIS — Z923 Personal history of irradiation: Secondary | ICD-10-CM

## 2023-03-10 DIAGNOSIS — Z85819 Personal history of malignant neoplasm of unspecified site of lip, oral cavity, and pharynx: Secondary | ICD-10-CM

## 2023-03-10 DIAGNOSIS — F32A Depression, unspecified: Secondary | ICD-10-CM | POA: Diagnosis not present

## 2023-03-10 DIAGNOSIS — R682 Dry mouth, unspecified: Secondary | ICD-10-CM | POA: Diagnosis not present

## 2023-03-10 DIAGNOSIS — R42 Dizziness and giddiness: Secondary | ICD-10-CM

## 2023-03-10 DIAGNOSIS — R197 Diarrhea, unspecified: Secondary | ICD-10-CM | POA: Diagnosis not present

## 2023-03-10 DIAGNOSIS — R0602 Shortness of breath: Secondary | ICD-10-CM

## 2023-03-10 DIAGNOSIS — Z8249 Family history of ischemic heart disease and other diseases of the circulatory system: Secondary | ICD-10-CM | POA: Diagnosis not present

## 2023-03-10 DIAGNOSIS — R079 Chest pain, unspecified: Secondary | ICD-10-CM

## 2023-03-10 DIAGNOSIS — Z90710 Acquired absence of both cervix and uterus: Secondary | ICD-10-CM

## 2023-03-10 DIAGNOSIS — Z8719 Personal history of other diseases of the digestive system: Secondary | ICD-10-CM

## 2023-03-10 DIAGNOSIS — W19XXXA Unspecified fall, initial encounter: Secondary | ICD-10-CM

## 2023-03-10 DIAGNOSIS — Z818 Family history of other mental and behavioral disorders: Secondary | ICD-10-CM | POA: Diagnosis not present

## 2023-03-10 DIAGNOSIS — Z91030 Bee allergy status: Secondary | ICD-10-CM

## 2023-03-10 DIAGNOSIS — Z9221 Personal history of antineoplastic chemotherapy: Secondary | ICD-10-CM | POA: Diagnosis not present

## 2023-03-10 DIAGNOSIS — Z86010 Personal history of colonic polyps: Secondary | ICD-10-CM

## 2023-03-10 DIAGNOSIS — R059 Cough, unspecified: Secondary | ICD-10-CM

## 2023-03-10 NOTE — Patient Instructions (Addendum)
[facility]  Discharge Instructions  You were seen and examined today by Dr. Delton Coombes. Dr. Delton Coombes is a medical oncologist, meaning that he specializes in the treatment of cancer diagnoses. Dr. Delton Coombes discussed your past medical history, family history of cancers, and the events that led to you being here today.  You were referred to Dr. 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  Dr. Delton Coombes has recommended labs today as well as a CT scan of your neck due to your pain and history of cancer.  Follow-up as scheduled.  Thank you for choosing [facility] to provide your oncology and hematology care.   To afford each patient quality time with our provider, please arrive at least 15 minutes before your scheduled appointment time. You may need to reschedule your appointment if you arrive late (10 or more minutes). Arriving late affects you and other patients whose appointments are after yours.  Also, if you miss three or more appointments without notifying the office, you may be dismissed from the clinic at the provider's discretion.    Again, thank you for choosing [facility].  Our hope is that these requests will decrease the amount of time that you wait before being seen by our physicians.   If you have a lab appointment with the [facility] please come in thru the Main Entrance and check in at the main information desk.           _____________________________________________________________  Should you have questions after your visit to [facility], please contact our office at [phone] and follow the prompts.  Our office hours are 8:00 a.m. to 4:30 p.m. Monday - Thursday and 8:00 a.m. to 2:30 p.m. Friday.  Please note that voicemails left after 4:00 p.m. may not be returned until the following business day.  We are closed weekends and all major holidays.  You do have  access to a nurse 24-7, just call the main number to the clinic [phone] and do not press any options, hold on the line and a nurse will answer the phone.    For prescription refill requests, have your pharmacy contact our office and allow 72 hours.    Masks are optional in the cancer centers. If you would like for your care team to wear a mask while they are taking care of you, please let them know. You may have one support person who is at least 68 years old accompany you for your appointments.

## 2023-03-11 ENCOUNTER — Inpatient Hospital Stay: Payer: 59 | Admitting: Licensed Clinical Social Worker

## 2023-03-11 DIAGNOSIS — Z85819 Personal history of malignant neoplasm of unspecified site of lip, oral cavity, and pharynx: Secondary | ICD-10-CM

## 2023-03-11 NOTE — Progress Notes (Signed)
[name]  Initial Assessment   [name] is a 67 y.o. year old female contacted by phone. Clinical Social [name] was referred by medical provider for assessment of psychosocial needs.   SDOH (Social Determinants of Health) assessments performed: Yes SDOH Interventions    Flowsheet Row Office Visit from 09/27/2020 in [facility] Clinical Support from 04/19/2017 in [facility] Office Visit from 04/09/2017 in [facility] Office Visit from 07/05/2015 in [facility]  SDOH Interventions      Depression Interventions/Treatment  Currently on Treatment Currently on Treatment Currently on Treatment Currently on Treatment, Medication       SDOH Screenings   Food Insecurity: No Food Insecurity (03/10/2023)  Housing: Low Risk  (03/10/2023)  Transportation Needs: No Transportation Needs (03/10/2023)  Utilities: Not At Risk (03/10/2023)  Depression (PHQ2-9): Medium Risk (03/10/2023)  Financial Resource Strain: Low Risk  (08/01/2019)  Physical Activity: Inactive (08/01/2019)  Social Connections: Moderately Integrated (08/01/2019)  Stress: Stress Concern Present (08/01/2019)  Tobacco Use: Medium Risk (09/27/2020)     Distress Screen completed: Yes    08/02/2019    2:30 PM  ONCBCN DISTRESS SCREENING  Screening Type Initial Screening  Distress experienced in past week (1-10) 2  Information Concerns Type Lack of info about diagnosis  Physician notified of physical symptoms Yes  Referral to clinical psychology No  Referral to clinical social [name] No  Referral to dietition No  Referral to financial advocate No  Referral to support programs No  Referral to palliative care No      Family/Social Information:  Housing Arrangement: patient lives with her daughter and grand daughter.  Pt's spouse passed away Mar 17, 2023 which was not expected. Family  members/support persons in your life? Pt has a son in [place] and another daughter residing in [place] who check in to offer emotional support.  Pt's primary support is the daughter with whom she resides. Transportation concerns: no  Employment: Retired .  Income source: Paediatric nurse concerns: No Type of concern: None Food access concerns: no Religious or spiritual practice: Yes-Christian Services Currently in place:  none  Coping/ Adjustment to diagnosis: Patient understands treatment plan and what happens next? Pt is scheduled for a CT on 4/1 and will follow up with Dr. Delton Coombes on 4/4 to discuss results.  Diagnosis is unknown at present. Concerns about diagnosis and/or treatment: Overwhelmed by information and Afraid of cancer Patient reported stressors: Depression, Anxiety/ nervousness, and Adjusting to my illness Hopes and/or priorities: Pt's priority is to get through diagnostics with the hopes of good news as she has suffered a tremendous loss recently and has been through cancer treatment 2 times previously. Patient enjoys time with family/ friends Current coping skills/ strengths: Motivation for treatment/growth  and Supportive family/friends     SUMMARY: Current SDOH Barriers:  Pt has suffered a tremendous loss recently and a cancer diagnosis would be difficult to process.  Clinical Social [name] Clinical Goal(s):  CSW will continue to follow pt's progress through diagnostics and will address needs as identified if pt is found to have cancer.  Interventions: Discussed common feeling and emotions when being diagnosed with cancer, and the importance of support during treatment Informed patient of the support team roles and support services at [facility] Provided CSW contact information and encouraged patient to call with any questions or concerns CSW discussed supportive services through [facility] as well as financial  resources available should she need to start  cancer treatment.   Follow Up Plan:  CSW to follow up once diagnostics are complete Patient verbalizes understanding of plan: Yes    Henriette Combs, LCSW

## 2023-03-16 DIAGNOSIS — B349 Viral infection, unspecified: Secondary | ICD-10-CM | POA: Diagnosis not present

## 2023-03-16 DIAGNOSIS — R197 Diarrhea, unspecified: Secondary | ICD-10-CM | POA: Diagnosis not present

## 2023-03-16 DIAGNOSIS — R42 Dizziness and giddiness: Secondary | ICD-10-CM | POA: Diagnosis not present

## 2023-03-16 DIAGNOSIS — I10 Essential (primary) hypertension: Secondary | ICD-10-CM | POA: Diagnosis not present

## 2023-03-16 DIAGNOSIS — Z85818 Personal history of malignant neoplasm of other sites of lip, oral cavity, and pharynx: Secondary | ICD-10-CM | POA: Diagnosis not present

## 2023-03-16 DIAGNOSIS — R1310 Dysphagia, unspecified: Secondary | ICD-10-CM | POA: Diagnosis not present

## 2023-03-16 DIAGNOSIS — E1165 Type 2 diabetes mellitus with hyperglycemia: Secondary | ICD-10-CM | POA: Diagnosis not present

## 2023-03-16 DIAGNOSIS — J449 Chronic obstructive pulmonary disease, unspecified: Secondary | ICD-10-CM | POA: Diagnosis not present

## 2023-03-18 ENCOUNTER — Emergency Department (HOSPITAL_COMMUNITY): Payer: 59

## 2023-03-18 ENCOUNTER — Other Ambulatory Visit: Payer: Self-pay

## 2023-03-18 ENCOUNTER — Encounter (HOSPITAL_COMMUNITY): Payer: Self-pay

## 2023-03-18 ENCOUNTER — Emergency Department (HOSPITAL_COMMUNITY)
Admission: EM | Admit: 2023-03-18 | Discharge: 2023-03-18 | Disposition: A | Discharge status: Home / Self Care | Payer: 59 | Attending: Student | Admitting: Student

## 2023-03-18 DIAGNOSIS — M542 Cervicalgia: Secondary | ICD-10-CM | POA: Insufficient documentation

## 2023-03-18 DIAGNOSIS — R079 Chest pain, unspecified: Secondary | ICD-10-CM | POA: Diagnosis not present

## 2023-03-18 DIAGNOSIS — M25512 Pain in left shoulder: Secondary | ICD-10-CM | POA: Diagnosis not present

## 2023-03-18 DIAGNOSIS — C801 Malignant (primary) neoplasm, unspecified: Secondary | ICD-10-CM | POA: Diagnosis not present

## 2023-03-18 DIAGNOSIS — R0789 Other chest pain: Secondary | ICD-10-CM | POA: Diagnosis not present

## 2023-03-18 LAB — CBC
HCT: 37.9 % (ref 36.0–46.0)
Hemoglobin: 12.2 g/dL (ref 12.0–15.0)
MCH: 29.3 pg (ref 26.0–34.0)
MCHC: 32.2 g/dL (ref 30.0–36.0)
MCV: 91.1 fL (ref 80.0–100.0)
Platelets: 410 10*3/uL — ABNORMAL HIGH (ref 150–400)
RBC: 4.16 MIL/uL (ref 3.87–5.11)
RDW: 12.3 % (ref 11.5–15.5)
WBC: 6.1 10*3/uL (ref 4.0–10.5)
nRBC: 0 % (ref 0.0–0.2)

## 2023-03-18 LAB — BASIC METABOLIC PANEL
Anion gap: 8 (ref 5–15)
BUN: 13 mg/dL (ref 8–23)
CO2: 27 mmol/L (ref 22–32)
Calcium: 9.1 mg/dL (ref 8.9–10.3)
Chloride: 100 mmol/L (ref 98–111)
Creatinine, Ser: 0.98 mg/dL (ref 0.44–1.00)
GFR, Estimated: >60 mL/min (ref >60)
Glucose, Bld: 145 mg/dL — ABNORMAL HIGH (ref 70–99)
Potassium: 4.1 mmol/L (ref 3.5–5.1)
Sodium: 135 mmol/L (ref 135–145)

## 2023-03-18 LAB — TROPONIN I (HIGH SENSITIVITY)
Troponin I (High Sensitivity): 2 ng/L (ref <18)
Troponin I (High Sensitivity): 2 ng/L (ref <18)

## 2023-03-18 MED ORDER — METHOCARBAMOL 500 MG PO TABS: 250.0000 mg | ORAL_TABLET | Freq: Three times a day (TID) | ORAL | 0 refills | Status: AC | PRN

## 2023-03-18 MED ORDER — LIDOCAINE 5 % EX PTCH
1.0000 | MEDICATED_PATCH | Freq: Once | CUTANEOUS | Status: DC
  Administered 2023-03-18: 1 via TRANSDERMAL
  Filled 2023-03-18: qty 1

## 2023-03-18 MED ORDER — HYDROCODONE-ACETAMINOPHEN 5-325 MG PO TABS: 0.5000 | ORAL_TABLET | Freq: Four times a day (QID) | ORAL | 0 refills | Status: DC | PRN

## 2023-03-18 MED ORDER — IOHEXOL 300 MG/ML  SOLN
75.0000 mL | Freq: Once | INTRAMUSCULAR | Status: AC | PRN
  Administered 2023-03-18: 75 mL via INTRAVENOUS

## 2023-03-18 NOTE — ED Triage Notes (Signed)
Pt presents with a 1 month history of L neck pain that radiates into  her head, chest, and back. Pt states the pain has just continued to worsen. Pt reports intermittent ShOB and diaphoresis. Pt does report increased stress due to the death of her husband recently.

## 2023-03-18 NOTE — ED Provider Notes (Signed)
[facility] Provider Note   CSN: [id number] Arrival date & time: 03/18/23  1342     History  Chief Complaint  Patient presents with   Chest Pain   Shoulder Pain    [name] is a 67 y.o. female who presents urgency department chief complaint of left-sided neck pain.  Patient states that she has had fairly severe waxing and waning but constant pain in the left side of her neck radiating into the left shoulder blade worse with movement of the neck and better with keeping her neck still for 3 weeks.  She has had associated diaphoresis at times.  She is extremely concerned about this because she has a history of neck cancer.  Patient saw Dr. Tera Helper who ordered imaging this coming week however patient states that her pain was so severe today she could not take it and came in for further evaluation.  She denies active chest pain or shortness of breath denies nausea or vomiting.  She has no known neck injuries.  She denies any neurologic deficits.  Pain does not radiate down her arm and she has no weakness in the extremity.   Chest Pain Shoulder Pain      Home Medications Prior to Admission medications   Medication Sig Start Date End Date Taking? Authorizing Provider  ACCU-CHEK SOFTCLIX LANCETS lancets USE TO TEST BLOOD GLUCOSE THREE TIMES DAILY AS DIRECTED 04/21/17   Evelina Dun A, FNP  ALPRAZolam Duanne Moron) 0.5 MG tablet Take 1 tablet (0.5 mg total) by mouth 2 (two) times daily as needed. for anxiety 10/24/20   Evelina Dun A, FNP  aspirin (ASPIRIN LOW DOSE) 81 MG EC tablet Take 1 tablet (81 mg total) by mouth daily. (Needs to be seen before next refill) 02/21/19   Sharion Balloon, FNP  atorvastatin (LIPITOR) 20 MG tablet Take 1 tablet (20 mg total) by mouth daily. 10/23/20   Evelina Dun A, FNP  buPROPion (WELLBUTRIN XL) 300 MG 24 hr tablet Take 1 tablet (300 mg total) by mouth daily. 10/23/20   Sharion Balloon, FNP  canagliflozin  (INVOKANA) 100 MG TABS tablet Take by mouth. 03/16/16   [provider]  cholecalciferol 25 MCG (1000 UT) tablet Take by mouth. 03/16/16   [provider]  diclofenac (VOLTAREN) 75 MG EC tablet Take 1 tablet (75 mg total) by mouth 2 (two) times daily. 10/23/20   Sharion Balloon, FNP  FLUoxetine (PROZAC) 40 MG capsule Take 1 capsule (40 mg total) by mouth daily. 10/23/20   Sharion Balloon, FNP  fluticasone (FLONASE) 50 MCG/ACT nasal spray INSTILL 2 SPRAYS INTO EACH NOSTRIL EVERY DAY 04/23/20   Evelina Dun A, FNP  fluticasone furoate-vilanterol (BREO ELLIPTA) 200-25 MCG/INH AEPB INHALE 1 PUFF INTO THE LUNGS DAILY 11/04/20   Evelina Dun A, FNP  lisinopril (ZESTRIL) 5 MG tablet Take 1 tablet (5 mg total) by mouth daily. 10/23/20   Sharion Balloon, FNP  loratadine (CLARITIN) 10 MG tablet Take 1 tablet (10 mg total) by mouth daily. 10/16/20   Sharion Balloon, FNP  meclizine (ANTIVERT) 12.5 MG tablet Take 12.5 mg by mouth 3 (three) times daily. 02/22/23   [provider]  metFORMIN (GLUCOPHAGE) 1000 MG tablet Take 1 tablet (1,000 mg total) by mouth 2 (two) times daily. 10/23/20   Evelina Dun A, FNP  mirabegron ER ([name]) 50 MG TB24 tablet Take 1 tablet (50 mg total) by mouth daily. 10/23/20   Sharion Balloon,  FNP  nitroGLYCERIN (NITROSTAT) 0.4 MG SL tablet PLACE 1 TABLET UNDER THE TONGUE EVERY 5 MINUTES UP TO 3 DOSES AS NEEDED FOR CHEST PAIN. 12/03/21   [provider]  omeprazole (PRILOSEC) 40 MG capsule Take one capsule 30 minutes before breakfast 10/24/20   Evelina Dun A, FNP  oxybutynin (DITROPAN XL) 15 MG 24 hr tablet Take 1 tablet (15 mg total) by mouth at bedtime. 10/23/20   Sharion Balloon, FNP  oxycodone-acetaminophen (PERCOCET) 2.5-325 MG tablet Take by mouth. 03/16/16   [provider]  pilocarpine (SALAGEN) 5 MG tablet Take 1 tablet (5 mg total) by mouth 2 (two) times daily. Needs to be seen before next refill 10/23/20   Evelina Dun A, FNP   traZODone (DESYREL) 100 MG tablet TAKE 2 TABLETS(200 MG) BY MOUTH AT BEDTIME 12/17/20   Hawks, Christy A, FNP  VENTOLIN HFA 108 (90 Base) MCG/ACT inhaler INHALE 2 PUFFS INTO THE LUNGS EVERY 6 (SIX) HOURS AS NEEDED FOR WHEEZING OR SHORTNESS OF BREATH. 08/05/20   Sharion Balloon, FNP      Allergies    Bee venom and Penicillins    Review of Systems   Review of Systems  Cardiovascular:  Positive for chest pain.    Physical Exam Updated Vital Signs BP (!) 156/78 (BP Location: Right Arm)   Pulse 93   Temp 98.3 F (36.8 C) (Oral)   Resp 20   Ht 5' 7.5" (1.715 m)   Wt 93 kg   SpO2 99%   BMI 31.63 kg/m  Physical Exam Vitals and nursing note reviewed.  Constitutional:      General: She is not in acute distress.    Appearance: She is well-developed. She is not diaphoretic.  HENT:     Head: Normocephalic and atraumatic.     Right Ear: External ear normal.     Left Ear: External ear normal.     Nose: Nose normal.     Mouth/Throat:     Mouth: Mucous membranes are moist.  Eyes:     General: No scleral icterus.    Conjunctiva/sclera: Conjunctivae normal.  Neck:     Comments: Tenderness to palpation over the left suboccipital region, left trapezius and left levator scapula muscle. Cardiovascular:     Rate and Rhythm: Normal rate and regular rhythm.     Heart sounds: Normal heart sounds. No murmur heard.    No friction rub. No gallop.  Pulmonary:     Effort: Pulmonary effort is normal. No respiratory distress.     Breath sounds: Normal breath sounds.  Abdominal:     General: Bowel sounds are normal. There is no distension.     Palpations: Abdomen is soft. There is no mass.     Tenderness: There is no abdominal tenderness. There is no guarding.  Musculoskeletal:     Cervical back: Normal range of motion.  Skin:    General: Skin is warm and dry.  Neurological:     Mental Status: She is alert and oriented to person, place, and time.  Psychiatric:        Mood and Affect: Mood  is anxious.        Behavior: Behavior normal.     ED Results / Procedures / Treatments   Labs (all labs ordered are listed, but only abnormal results are displayed) Labs Reviewed  BASIC METABOLIC PANEL  CBC  TROPONIN I (HIGH SENSITIVITY)    EKG None  Radiology DG Chest 2 View  Result Date: 03/18/2023 CLINICAL  DATA:  Chest pain. EXAM: CHEST - 2 VIEW COMPARISON:  None Available. FINDINGS: Clear lungs. Normal heart size and mediastinal contours. No pleural effusion or pneumothorax. Visualized bones and upper abdomen are unremarkable. IMPRESSION: No evidence of acute cardiopulmonary disease. Electronically Signed   By: Emmit Alexanders M.D.   On: 03/18/2023 14:10    Procedures Procedures    Medications Ordered in ED Medications - No data to display  ED Course/ Medical Decision Making/ A&P Clinical Course as of 03/18/23 1912  Thu Mar 17, 6536  [id number] 67 year old female with history of neck cancer.  Initial impression is that she has musculoskeletal pain associated with spasm or trigger point of the left side of the neck however she does have a significant history of cancer.  Plan is to obtain imaging to rule out pathologic fracture or metastatic lesions.  I doubt ACS however we will also evaluate for this as well.  Patient states that her pain is not severe at rest.  She has no neurologic deficits suggestive of myelopathy or vertebral artery dissection [AH]  1510 Glucose(!): 145 [AH]  1511 Platelets(!): 410 [AH]  1511 DG Chest 2 View [AH]    Clinical Course User Index [AH] Margarita Mail, PA-C                             Medical Decision Making Patient here with complaint of left-sided neck pain. Parental diagnosis gnosis discussed in ED course.  Review of all data points I believe this is musculoskeletal.  I ordered labs including troponin which is negative x 2 here in the emergency department, BMP with mildly elevated blood glucose, CBC without significant abnormality.  I  independently interpreted and visualized CT soft tissue of the neck which shows no acute findings.  I also visualized interpreted two-view chest x-rays which again shows no acute findings.  I independently interpreted EKG which shows normal sinus rhythm at a rate of 90.  Patient given Lidoderm patch.  She is advised to follow-up with outpatient physical therapy.  PDMP reviewed during this encounter.    It does not appear to have recurrent metastatic disease, pathologic fracture or ACS.  Amount and/or Complexity of Data Reviewed Labs: ordered. Decision-making details documented in ED Course. Radiology: ordered. Decision-making details documented in ED Course.  Risk Prescription drug management.           Final Clinical Impression(s) / ED Diagnoses Final diagnoses:  None    Rx / DC Orders ED Discharge Orders     None         Margarita Mail, PA-C 03/18/23 1944    Teressa Lower, MD 03/18/23 2043

## 2023-03-22 ENCOUNTER — Ambulatory Visit (HOSPITAL_COMMUNITY): Payer: 59

## 2023-03-24 ENCOUNTER — Inpatient Hospital Stay: Payer: 59 | Attending: Hematology | Admitting: Hematology

## 2023-03-29 DIAGNOSIS — M542 Cervicalgia: Secondary | ICD-10-CM | POA: Diagnosis not present

## 2023-03-29 DIAGNOSIS — H6120 Impacted cerumen, unspecified ear: Secondary | ICD-10-CM | POA: Diagnosis not present

## 2023-04-02 DIAGNOSIS — M542 Cervicalgia: Secondary | ICD-10-CM | POA: Diagnosis not present

## 2023-04-02 DIAGNOSIS — M4802 Spinal stenosis, cervical region: Secondary | ICD-10-CM | POA: Diagnosis not present

## 2023-04-02 DIAGNOSIS — M47812 Spondylosis without myelopathy or radiculopathy, cervical region: Secondary | ICD-10-CM | POA: Diagnosis not present

## 2023-04-12 DIAGNOSIS — G8929 Other chronic pain: Secondary | ICD-10-CM | POA: Diagnosis not present

## 2023-04-12 DIAGNOSIS — M2569 Stiffness of other specified joint, not elsewhere classified: Secondary | ICD-10-CM | POA: Diagnosis not present

## 2023-04-12 DIAGNOSIS — M25512 Pain in left shoulder: Secondary | ICD-10-CM | POA: Diagnosis not present

## 2023-04-12 DIAGNOSIS — M25612 Stiffness of left shoulder, not elsewhere classified: Secondary | ICD-10-CM | POA: Diagnosis not present

## 2023-04-12 DIAGNOSIS — M542 Cervicalgia: Secondary | ICD-10-CM | POA: Diagnosis not present

## 2023-04-14 DIAGNOSIS — G8929 Other chronic pain: Secondary | ICD-10-CM | POA: Diagnosis not present

## 2023-04-14 DIAGNOSIS — M25512 Pain in left shoulder: Secondary | ICD-10-CM | POA: Diagnosis not present

## 2023-04-14 DIAGNOSIS — M2569 Stiffness of other specified joint, not elsewhere classified: Secondary | ICD-10-CM | POA: Diagnosis not present

## 2023-04-14 DIAGNOSIS — M542 Cervicalgia: Secondary | ICD-10-CM | POA: Diagnosis not present

## 2023-04-14 DIAGNOSIS — M25612 Stiffness of left shoulder, not elsewhere classified: Secondary | ICD-10-CM | POA: Diagnosis not present

## 2023-04-19 DIAGNOSIS — Z86010 Personal history of colonic polyps: Secondary | ICD-10-CM | POA: Diagnosis not present

## 2023-04-19 DIAGNOSIS — R42 Dizziness and giddiness: Secondary | ICD-10-CM | POA: Diagnosis not present

## 2023-04-19 DIAGNOSIS — R197 Diarrhea, unspecified: Secondary | ICD-10-CM | POA: Diagnosis not present

## 2023-04-19 DIAGNOSIS — E1165 Type 2 diabetes mellitus with hyperglycemia: Secondary | ICD-10-CM | POA: Diagnosis not present

## 2023-04-19 DIAGNOSIS — I10 Essential (primary) hypertension: Secondary | ICD-10-CM | POA: Diagnosis not present

## 2023-04-19 DIAGNOSIS — M542 Cervicalgia: Secondary | ICD-10-CM | POA: Diagnosis not present

## 2023-04-19 DIAGNOSIS — R030 Elevated blood-pressure reading, without diagnosis of hypertension: Secondary | ICD-10-CM | POA: Diagnosis not present

## 2023-04-19 DIAGNOSIS — J449 Chronic obstructive pulmonary disease, unspecified: Secondary | ICD-10-CM | POA: Diagnosis not present

## 2023-04-19 DIAGNOSIS — R1310 Dysphagia, unspecified: Secondary | ICD-10-CM | POA: Diagnosis not present

## 2023-04-20 DIAGNOSIS — M6281 Muscle weakness (generalized): Secondary | ICD-10-CM | POA: Diagnosis not present

## 2023-04-20 DIAGNOSIS — M542 Cervicalgia: Secondary | ICD-10-CM | POA: Diagnosis not present

## 2023-04-20 DIAGNOSIS — M25512 Pain in left shoulder: Secondary | ICD-10-CM | POA: Diagnosis not present

## 2023-04-26 DIAGNOSIS — M4312 Spondylolisthesis, cervical region: Secondary | ICD-10-CM | POA: Diagnosis not present

## 2023-04-26 DIAGNOSIS — M4802 Spinal stenosis, cervical region: Secondary | ICD-10-CM | POA: Diagnosis not present

## 2023-04-26 DIAGNOSIS — M47812 Spondylosis without myelopathy or radiculopathy, cervical region: Secondary | ICD-10-CM | POA: Diagnosis not present

## 2023-05-04 DIAGNOSIS — M25512 Pain in left shoulder: Secondary | ICD-10-CM | POA: Diagnosis not present

## 2023-05-04 DIAGNOSIS — M542 Cervicalgia: Secondary | ICD-10-CM | POA: Diagnosis not present

## 2023-05-04 DIAGNOSIS — M6281 Muscle weakness (generalized): Secondary | ICD-10-CM | POA: Diagnosis not present

## 2023-05-06 DIAGNOSIS — M542 Cervicalgia: Secondary | ICD-10-CM | POA: Diagnosis not present

## 2023-05-06 DIAGNOSIS — M6281 Muscle weakness (generalized): Secondary | ICD-10-CM | POA: Diagnosis not present

## 2023-05-06 DIAGNOSIS — M25512 Pain in left shoulder: Secondary | ICD-10-CM | POA: Diagnosis not present

## 2023-05-10 DIAGNOSIS — M542 Cervicalgia: Secondary | ICD-10-CM | POA: Diagnosis not present

## 2023-05-10 DIAGNOSIS — M6281 Muscle weakness (generalized): Secondary | ICD-10-CM | POA: Diagnosis not present

## 2023-05-10 DIAGNOSIS — M25512 Pain in left shoulder: Secondary | ICD-10-CM | POA: Diagnosis not present

## 2023-05-10 NOTE — Telephone Encounter (Signed)
Erroneous encounter will close.

## 2023-05-13 DIAGNOSIS — M5412 Radiculopathy, cervical region: Secondary | ICD-10-CM | POA: Diagnosis not present

## 2023-05-25 ENCOUNTER — Ambulatory Visit: Payer: 59 | Attending: Neurosurgery | Admitting: Physical Therapy

## 2023-05-25 ENCOUNTER — Encounter: Payer: Self-pay | Admitting: Physical Therapy

## 2023-05-25 ENCOUNTER — Other Ambulatory Visit: Payer: Self-pay

## 2023-05-25 DIAGNOSIS — M542 Cervicalgia: Secondary | ICD-10-CM | POA: Insufficient documentation

## 2023-05-25 DIAGNOSIS — M62838 Other muscle spasm: Secondary | ICD-10-CM | POA: Insufficient documentation

## 2023-05-25 DIAGNOSIS — R293 Abnormal posture: Secondary | ICD-10-CM | POA: Insufficient documentation

## 2023-05-25 NOTE — Therapy (Signed)
OUTPATIENT PHYSICAL THERAPY CERVICAL EVALUATION   Patient Name: [name] MRN: [id number] DOB:July 12, 1956, 67 y.o., female Today's Date: 05/25/2023  END OF SESSION:  PT End of Session - 05/25/23 1557     Visit Number 1    Number of Visits 12    Date for PT Re-Evaluation 08/24/23    Authorization Type FOTO.    PT Start Time 0315    Activity Tolerance Patient tolerated treatment well    Behavior During Therapy WFL for tasks assessed/performed             Past Medical History:  Diagnosis Date   Adenoid cystic carcinoma of submandibular gland [facility]) dx 2016---  oncologist-  dr Lonie Peak [facility])   Left submandibular salivary gland--  T1 N0 M0,  Grade 2 w/ perineural invasion  s/p  resection 03-05-2015  and Radiation therapy 04-29-2015 to 06-11-2015   Allergic rhinitis    Anemia    Arthritis    Bipolar disorder (HCC)    [facility]   Breast cancer (HCC)    COPD (chronic obstructive pulmonary disease) (HCC)    Cystocele    Decreased sense of taste    secondary to radiation   GAD (generalized anxiety disorder)    GERD (gastroesophageal reflux disease)    Headache    Hemorrhoid    INTERNAL AND EXTERNAL     History of adenomatous polyp of colon    History of breast cancer dx 2007--- oncologist-- dr Roanna Banning--- no recurrence   Right breast DCIS ,  Stage 2 (T2 N0 M0) --  s/p  right partial mastecotmy w/ sln dissection,  chemotherapy complete 02-01-2007,  radiation completed 06-15-2007   History of esophageal dilatation    History of esophagitis    History of external beam radiation therapy    04-27-2007 to 06-15-2007 , right breast- 5040cGy in 28 sessions and boost 1200 cGy in 6 sessions/   04-29-2015 to 06-11-2015 , left neck and skull base -- 60 Gy in 30 fractions   History of hiatal hernia    History of panic attacks    History of perforation of tympanic membrane    AFTER ACUTE OTITIS MEDIA 2016-- RESOLVED   Hypertriglyceridemia    ?   Loss of teeth due  to extraction    secondary to radiation therapy---  x8 or 9   Mouth dryness    secondary to radiation   Neck pain    resuidaul neck pain from radiation   Peripheral neuropathy    Personal history of chemotherapy    Personal history of radiation therapy    TMJ (temporomandibular joint disorder)    Type 2 diabetes mellitus (HCC)    Urge and stress incontinence    Wears glasses    Past Surgical History:  Procedure Laterality Date   COLONOSCOPY  03/30/2012   RMR: rectal and colonic polyps -removed as described above. Mellanosis coli   COLONOSCOPY WITH PROPOFOL N/A 05/25/2016   Procedure: COLONOSCOPY WITH PROPOFOL;  Surgeon: Corbin Ade, MD;  [facility];  Service: Endoscopy;  Laterality: N/A;  1400    ECTOPIC PREGNANCY SURGERY     ESOPHAGOGASTRODUODENOSCOPY (EGD) WITH PROPOFOL N/A 05/25/2016   Rourk: small hh, status post dilation of Schatzki ring, normal colon. Next colonoscopy June 2022   EXCISION / BIOPSY BREAST / NIPPLE / DUCT     INTERSTIM IMPLANT PLACEMENT N/A 08/11/2016   Serial Number [id number] H.Procedure: INTERSTIM IMPLANT FIRST STAGE;  Surgeon: Alfredo Martinez, MD;  Location: [facility];  Service: Urology;  Laterality: N/A;   INTERSTIM IMPLANT PLACEMENT N/A 08/11/2016   Procedure: Leane Platt IMPLANT SECOND STAGE IMPEDANCE CHECK;  Surgeon: Alfredo Martinez, MD;  Location: [facility];  Service: Urology;  Laterality: N/A;   MALONEY DILATION N/A 05/25/2016   Procedure: Elease Hashimoto DILATION;  Surgeon: Corbin Ade, MD;  Location: [facility];  Service: Endoscopy;  Laterality: N/A;   PARTIAL MASTECTOMY WITH AXILLARY SENTINEL LYMPH NODE BIOPSY Right 12/01/2006   and Right axilla node dissection x1   PORT-A-CATH PLACEMENT AND REMOVAL  12-31-2006/  05-13-2007   SUBMANDIBULAR GLAND EXCISION Left 03/05/2015   adenoid cystic carcinoma   TRANSTHORACIC ECHOCARDIOGRAM  12/29/2006   normal echo, ef 55%   TUBAL LIGATION  yrs ago   VAGINAL HYSTERECTOMY   10-26-2007  dr Despina Hidden   w/ Anterior Repair with intraspinous graft and vagainal vault suspension   Patient Active Problem List   Diagnosis Date Noted   History of right breast cancer 08/02/2019   H/O malignant neoplasm of submandibular gland 05/11/2019   Mouth dryness 05/01/2019   ' 03/14/2019   Controlled substance agreement signed 09/05/2018   Osteoarthritis 05/03/2017   Major depressive disorder, recurrent episode, moderate (HCC) 03/23/2017   PTSD (post-traumatic stress disorder) 03/23/2017   Constipation 07/03/2016   History of colonic polyps    Esophageal dysphagia 04/27/2016   Mixed incontinence urge and stress 07/05/2015   Vitamin D deficiency 01/18/2015   Hyperlipidemia associated with type 2 diabetes mellitus (HCC) 01/18/2015   COPD (chronic obstructive pulmonary disease) (HCC) 01/16/2015   Diabetes mellitus (HCC) 01/16/2015   Insomnia 01/16/2015   Hx of adenomatous colonic polyps 03/08/2012   GERD (gastroesophageal reflux disease) 03/08/2012    REFERRING PROVIDER: Julio Sicks MD  REFERRING DIAG: Cervical radiculopathy  THERAPY DIAG:  Cervicalgia  Other muscle spasm  Abnormal posture  Rationale for Evaluation and Treatment: Rehabilitation  ONSET DATE: ~8 months.  SUBJECTIVE:                                                                                                                                                                                                         SUBJECTIVE STATEMENT: The patient presents to the clinic with c/o left-sided neck pain that radiates into her left shoulder.  This has been ongoing for about 8 months with no known injury.  She had some PT recently and found electrical stimulation and massage was helpful to decrease pain some.  Increased activities and seated with head down increases her pain.  Her pain is rated at an  8/10 today.  She experiences headaches on occasions.  PERTINENT HISTORY:  H/o breast cancer, submandibular  excision (Cancer-free since 2016),   PAIN:  Are you having pain? Yes: NPRS scale: 8/10 Pain location: Left cervical/shoulder. Pain description: Ache, sore, throbbing and shooting. Aggravating factors: As above. Relieving factors: Rest, e'stim,massage.  PRECAUTIONS: None  WEIGHT BEARING RESTRICTIONS: No  FALLS:  Has patient fallen in last 6 months? No  LIVING ENVIRONMENT: Lives in: House/apartment Has following equipment at home: None  OCCUPATION: Retired.  PLOF: Independent  PATIENT GOALS: Not have pain.   OBJECTIVE:   DIAGNOSTIC FINDINGS:  X-ray:  02/28/23:  IMPRESSION: Degenerative cervical spondylosis with multilevel disc disease and facet disease.  CT:  04/02/23:  IMPRESSION: 1. Multilevel cervical spondylosis with resultant mild spinal stenosis at C3-4 through C6-7. 2. Multifactorial degenerative changes with resultant multilevel foraminal narrowing as above. Notable findings include moderate left C4 foraminal stenosis, with moderate left and mild-to-moderate right C6 and C7 foraminal narrowing. 3. Prominent reactive marrow edema and enhancement about the left C2-3 facet due to facet arthritis. Finding could serve as a source for neck pain and referred symptoms.   PATIENT SURVEYS:  FOTO .  POSTURE: rounded shoulders and forward head  PALPATION: Tender to palpation at SCM attachment on mastoid process, suboccipital region, left cervical paraspinal musculature and a significant increase in tone over her left UT.   CERVICAL ROM:  Active bilateral cervical rotation is 45 degrees and bilateral SBing is 15 degrees.  UPPER EXTREMITY ROM:  WNL.  UPPER EXTREMITY MMT: Normal UE strength.  DTR's:  Normal bilateral UE DTR's    TODAY'S TREATMENT:                                                                                                                              DATE: HMP and IFC at 80-150 Hz on 40% scan x 20 minutes to patient's left cervical  musculature.   Patient tolerated treatment without complaint with normal modality response following removal of modality.   PATIENT EDUCATION:  Education details: Discussed correct posture and neck support while reading and watching TV to keep head and neck in a neutral spine posture. Person educated: Patient Education method: Medical illustrator Education comprehension: verbalized understanding  HOME EXERCISE PROGRAM:   ASSESSMENT:  CLINICAL IMPRESSION: The patient presents to OPPT with c/o left-sided neck pain that has been ongoing for approximately 8 months.  Her posture is remarkable for a forward head and rounded shoulders.  We discussed the importance of correct posture.  She has significant limitations of active cervical range of motion.  Her UE strength is normal as are her DTR's.  She has tenderness over her left cervical region and increased tone over her left UT.  Her pain radiates into her right shoulder.  She had some PT recently and found electrical stimulation and massage helpful.  Patient will benefit from skilled physical therapy intervention to address pain and deficits.  OBJECTIVE  IMPAIRMENTS: decreased activity tolerance, decreased ROM, increased muscle spasms, postural dysfunction, and pain.   ACTIVITY LIMITATIONS: carrying, lifting, and reach over head  PARTICIPATION LIMITATIONS: meal prep, cleaning, and laundry  PERSONAL FACTORS: Time since onset of injury/illness/exacerbation are also affecting patient's functional outcome.   REHAB POTENTIAL: Good  CLINICAL DECISION MAKING: Stable/uncomplicated  EVALUATION COMPLEXITY: Low   GOALS:  SHORT TERM GOALS: Target date: 06/08/23  Ind with a HEP.  Goal status: INITIAL    LONG TERM GOALS: Target date: 08/23/23.  Increase active cervical rotation to 65 degrees+ so patient can turn head more easily while driving.  Goal status: INITIAL  2.  Eliminate UE symptoms.  Goal status: INITIAL  3.  Perform  ADL's with pain not > 3-4/10.  Goal status: INITIAL   PLAN:  PT FREQUENCY: 2x/week  PT DURATION: 6 weeks  PLANNED INTERVENTIONS: Therapeutic exercises, Therapeutic activity, Patient/Family education, Self Care, Dry Needling, Electrical stimulation, Cryotherapy, Moist heat, Traction, Ultrasound, and Manual therapy  PLAN FOR NEXT SESSION: Combo e'stim/US, STW/M, may try manual traction, chin tucks and cervical extension.  Postural exercises.   Chasitty Hehl, [place], PT 05/25/2023, 4:53 PM

## 2023-05-28 DIAGNOSIS — J449 Chronic obstructive pulmonary disease, unspecified: Secondary | ICD-10-CM | POA: Diagnosis not present

## 2023-05-28 DIAGNOSIS — E1165 Type 2 diabetes mellitus with hyperglycemia: Secondary | ICD-10-CM | POA: Diagnosis not present

## 2023-05-28 DIAGNOSIS — Z86010 Personal history of colonic polyps: Secondary | ICD-10-CM | POA: Diagnosis not present

## 2023-05-28 DIAGNOSIS — M542 Cervicalgia: Secondary | ICD-10-CM | POA: Diagnosis not present

## 2023-05-28 DIAGNOSIS — H6120 Impacted cerumen, unspecified ear: Secondary | ICD-10-CM | POA: Diagnosis not present

## 2023-05-28 DIAGNOSIS — R42 Dizziness and giddiness: Secondary | ICD-10-CM | POA: Diagnosis not present

## 2023-05-28 DIAGNOSIS — I10 Essential (primary) hypertension: Secondary | ICD-10-CM | POA: Diagnosis not present

## 2023-05-28 DIAGNOSIS — R1310 Dysphagia, unspecified: Secondary | ICD-10-CM | POA: Diagnosis not present

## 2023-05-28 DIAGNOSIS — R197 Diarrhea, unspecified: Secondary | ICD-10-CM | POA: Diagnosis not present

## 2023-06-01 ENCOUNTER — Ambulatory Visit: Payer: 59 | Admitting: Physical Therapy

## 2023-06-03 ENCOUNTER — Encounter: Payer: 59 | Admitting: Physical Therapy

## 2023-06-20 DIAGNOSIS — I10 Essential (primary) hypertension: Secondary | ICD-10-CM | POA: Diagnosis not present

## 2023-06-20 DIAGNOSIS — J449 Chronic obstructive pulmonary disease, unspecified: Secondary | ICD-10-CM | POA: Diagnosis not present

## 2023-06-20 DIAGNOSIS — E1165 Type 2 diabetes mellitus with hyperglycemia: Secondary | ICD-10-CM | POA: Diagnosis not present

## 2023-06-22 DIAGNOSIS — M549 Dorsalgia, unspecified: Secondary | ICD-10-CM | POA: Diagnosis not present

## 2023-06-22 DIAGNOSIS — M6281 Muscle weakness (generalized): Secondary | ICD-10-CM | POA: Diagnosis not present

## 2023-06-22 DIAGNOSIS — M25512 Pain in left shoulder: Secondary | ICD-10-CM | POA: Diagnosis not present

## 2023-06-22 DIAGNOSIS — M542 Cervicalgia: Secondary | ICD-10-CM | POA: Diagnosis not present

## 2023-07-01 DIAGNOSIS — R1310 Dysphagia, unspecified: Secondary | ICD-10-CM | POA: Diagnosis not present

## 2023-07-01 DIAGNOSIS — R739 Hyperglycemia, unspecified: Secondary | ICD-10-CM | POA: Diagnosis not present

## 2023-07-01 DIAGNOSIS — M542 Cervicalgia: Secondary | ICD-10-CM | POA: Diagnosis not present

## 2023-07-01 DIAGNOSIS — R42 Dizziness and giddiness: Secondary | ICD-10-CM | POA: Diagnosis not present

## 2023-07-01 DIAGNOSIS — Z86010 Personal history of colonic polyps: Secondary | ICD-10-CM | POA: Diagnosis not present

## 2023-07-01 DIAGNOSIS — I10 Essential (primary) hypertension: Secondary | ICD-10-CM | POA: Diagnosis not present

## 2023-07-01 DIAGNOSIS — Z85818 Personal history of malignant neoplasm of other sites of lip, oral cavity, and pharynx: Secondary | ICD-10-CM | POA: Diagnosis not present

## 2023-07-01 DIAGNOSIS — J449 Chronic obstructive pulmonary disease, unspecified: Secondary | ICD-10-CM | POA: Diagnosis not present

## 2023-07-07 DIAGNOSIS — M25552 Pain in left hip: Secondary | ICD-10-CM | POA: Diagnosis not present

## 2023-07-07 DIAGNOSIS — M7062 Trochanteric bursitis, left hip: Secondary | ICD-10-CM | POA: Diagnosis not present

## 2023-07-07 DIAGNOSIS — M5137 Other intervertebral disc degeneration, lumbosacral region: Secondary | ICD-10-CM | POA: Diagnosis not present

## 2023-07-09 DIAGNOSIS — M549 Dorsalgia, unspecified: Secondary | ICD-10-CM | POA: Diagnosis not present

## 2023-07-09 DIAGNOSIS — M25512 Pain in left shoulder: Secondary | ICD-10-CM | POA: Diagnosis not present

## 2023-07-09 DIAGNOSIS — M542 Cervicalgia: Secondary | ICD-10-CM | POA: Diagnosis not present

## 2023-07-09 DIAGNOSIS — M6281 Muscle weakness (generalized): Secondary | ICD-10-CM | POA: Diagnosis not present

## 2023-07-13 DIAGNOSIS — M25512 Pain in left shoulder: Secondary | ICD-10-CM | POA: Diagnosis not present

## 2023-07-13 DIAGNOSIS — M6281 Muscle weakness (generalized): Secondary | ICD-10-CM | POA: Diagnosis not present

## 2023-07-13 DIAGNOSIS — M549 Dorsalgia, unspecified: Secondary | ICD-10-CM | POA: Diagnosis not present

## 2023-07-13 DIAGNOSIS — M542 Cervicalgia: Secondary | ICD-10-CM | POA: Diagnosis not present

## 2023-07-15 DIAGNOSIS — Z1231 Encounter for screening mammogram for malignant neoplasm of breast: Secondary | ICD-10-CM | POA: Diagnosis not present

## 2023-07-21 DIAGNOSIS — I10 Essential (primary) hypertension: Secondary | ICD-10-CM | POA: Diagnosis not present

## 2023-07-21 DIAGNOSIS — M25512 Pain in left shoulder: Secondary | ICD-10-CM | POA: Diagnosis not present

## 2023-07-21 DIAGNOSIS — I25118 Atherosclerotic heart disease of native coronary artery with other forms of angina pectoris: Secondary | ICD-10-CM | POA: Diagnosis not present

## 2023-07-21 DIAGNOSIS — E1169 Type 2 diabetes mellitus with other specified complication: Secondary | ICD-10-CM | POA: Diagnosis not present

## 2023-07-21 DIAGNOSIS — G8929 Other chronic pain: Secondary | ICD-10-CM | POA: Diagnosis not present

## 2023-07-21 DIAGNOSIS — M5137 Other intervertebral disc degeneration, lumbosacral region: Secondary | ICD-10-CM | POA: Diagnosis not present

## 2023-07-21 DIAGNOSIS — J449 Chronic obstructive pulmonary disease, unspecified: Secondary | ICD-10-CM | POA: Diagnosis not present

## 2023-07-21 DIAGNOSIS — E785 Hyperlipidemia, unspecified: Secondary | ICD-10-CM | POA: Diagnosis not present

## 2023-07-21 DIAGNOSIS — E1165 Type 2 diabetes mellitus with hyperglycemia: Secondary | ICD-10-CM | POA: Diagnosis not present

## 2023-07-21 DIAGNOSIS — M25552 Pain in left hip: Secondary | ICD-10-CM | POA: Diagnosis not present

## 2023-07-21 DIAGNOSIS — M75102 Unspecified rotator cuff tear or rupture of left shoulder, not specified as traumatic: Secondary | ICD-10-CM | POA: Diagnosis not present

## 2023-07-28 DIAGNOSIS — C50911 Malignant neoplasm of unspecified site of right female breast: Secondary | ICD-10-CM | POA: Diagnosis not present

## 2023-07-28 DIAGNOSIS — N6459 Other signs and symptoms in breast: Secondary | ICD-10-CM | POA: Diagnosis not present

## 2023-07-28 DIAGNOSIS — R92321 Mammographic fibroglandular density, right breast: Secondary | ICD-10-CM | POA: Diagnosis not present

## 2023-07-28 DIAGNOSIS — R928 Other abnormal and inconclusive findings on diagnostic imaging of breast: Secondary | ICD-10-CM | POA: Diagnosis not present

## 2023-07-28 DIAGNOSIS — R921 Mammographic calcification found on diagnostic imaging of breast: Secondary | ICD-10-CM | POA: Diagnosis not present

## 2023-08-04 DIAGNOSIS — N6313 Unspecified lump in the right breast, lower outer quadrant: Secondary | ICD-10-CM | POA: Diagnosis not present

## 2023-08-04 DIAGNOSIS — N641 Fat necrosis of breast: Secondary | ICD-10-CM | POA: Diagnosis not present

## 2023-08-04 DIAGNOSIS — N6314 Unspecified lump in the right breast, lower inner quadrant: Secondary | ICD-10-CM | POA: Diagnosis not present

## 2023-08-18 DIAGNOSIS — M25552 Pain in left hip: Secondary | ICD-10-CM | POA: Diagnosis not present

## 2023-08-18 DIAGNOSIS — M19012 Primary osteoarthritis, left shoulder: Secondary | ICD-10-CM | POA: Diagnosis not present

## 2023-08-18 DIAGNOSIS — M5137 Other intervertebral disc degeneration, lumbosacral region: Secondary | ICD-10-CM | POA: Diagnosis not present

## 2023-08-18 DIAGNOSIS — M542 Cervicalgia: Secondary | ICD-10-CM | POA: Diagnosis not present

## 2023-08-18 DIAGNOSIS — M75102 Unspecified rotator cuff tear or rupture of left shoulder, not specified as traumatic: Secondary | ICD-10-CM | POA: Diagnosis not present

## 2023-10-25 ENCOUNTER — Encounter: Payer: Self-pay | Admitting: *Deleted

## 2023-12-09 ENCOUNTER — Ambulatory Visit: Payer: 59 | Admitting: Family Medicine

## 2023-12-09 ENCOUNTER — Encounter: Payer: Self-pay | Admitting: Family Medicine

## 2023-12-09 VITALS — BP 114/74 | HR 85 | Temp 98.5°F | Ht 67.7 in | Wt 215.0 lb

## 2023-12-09 DIAGNOSIS — E785 Hyperlipidemia, unspecified: Secondary | ICD-10-CM | POA: Diagnosis not present

## 2023-12-09 DIAGNOSIS — E1169 Type 2 diabetes mellitus with other specified complication: Secondary | ICD-10-CM | POA: Diagnosis not present

## 2023-12-09 DIAGNOSIS — E559 Vitamin D deficiency, unspecified: Secondary | ICD-10-CM

## 2023-12-09 DIAGNOSIS — Z23 Encounter for immunization: Secondary | ICD-10-CM | POA: Diagnosis not present

## 2023-12-09 DIAGNOSIS — R42 Dizziness and giddiness: Secondary | ICD-10-CM | POA: Diagnosis not present

## 2023-12-09 DIAGNOSIS — F331 Major depressive disorder, recurrent, moderate: Secondary | ICD-10-CM | POA: Diagnosis not present

## 2023-12-09 DIAGNOSIS — Z7984 Long term (current) use of oral hypoglycemic drugs: Secondary | ICD-10-CM

## 2023-12-09 DIAGNOSIS — G4700 Insomnia, unspecified: Secondary | ICD-10-CM | POA: Diagnosis not present

## 2023-12-09 DIAGNOSIS — Z79899 Other long term (current) drug therapy: Secondary | ICD-10-CM

## 2023-12-09 DIAGNOSIS — J449 Chronic obstructive pulmonary disease, unspecified: Secondary | ICD-10-CM | POA: Diagnosis not present

## 2023-12-09 DIAGNOSIS — F4310 Post-traumatic stress disorder, unspecified: Secondary | ICD-10-CM

## 2023-12-09 LAB — BAYER DCA HB A1C WAIVED: HB A1C (BAYER DCA - WAIVED): 7.8 % — ABNORMAL HIGH (ref 4.8–5.6)

## 2023-12-09 MED ORDER — CETIRIZINE HCL 5 MG PO CHEW: 5.0000 mg | CHEWABLE_TABLET | Freq: Every day | ORAL | 6 refills | Status: AC

## 2023-12-09 MED ORDER — ALPRAZOLAM 0.25 MG PO TABS: 0.2500 mg | ORAL_TABLET | ORAL | 0 refills | Status: DC

## 2023-12-09 MED ORDER — MONTELUKAST SODIUM 10 MG PO TABS: 10.0000 mg | ORAL_TABLET | Freq: Every day | ORAL | 6 refills | Status: DC

## 2023-12-09 NOTE — Patient Instructions (Addendum)
[facility]  Phone:  [phone]  Address:  [address]  [place], [place] [address]  Hours:  Open 24/7, No appointment required.    The [facility] ([facility]) is centrally located in the [place] at [address] [place]    Let me know if you need Lisinopril Stop Claritin, switch to zyrtec  Check BG daily in AM

## 2023-12-09 NOTE — Progress Notes (Signed)
New Patient Office Visit  Subjective   Patient ID: [name], female    DOB: 03/12/1956  Age: 67 y.o. MRN: [id number]  CC:  Chief Complaint  Patient presents with   New Patient (Initial Visit)    Establish care COPD, cough, left ear pain, dizzy spell, insomnia    HPI [name] presents to establish care States that she used to be a patient of [facility], states that she cannot remember the reason she moved to [facility], but she wanted to move back to [facility].  States that she is out of all of her prescriptions. However, she does not know what she is out of. After chart review, patient should only be out of, Alprazolam, Lisinopril and Singulair.  She is concerned that she is about to run out of alprazolam. States that she has been taking it for about 30 years. States that previous provider has reduced her to the minimum dose. She takes it at night "to relax" and to help her sleep. She also takes melatonin and magnesium at night for sleep. States that she still cannot sleep. Reports that her husband passed away in 21-Mar-2023 and she has increased stress from that. States that she has been spreading it out to make it last. States that she was previously referred to therapy but she did not get in contact with them. She is trying to care for her daughter and granddaughter. She endorses SI. Denies active plans.   States that she has a history of COPD and has been sick since October. She is using albuterol inhaler and does not feel that she is getting better.  Taking albuterol twice daily. She is also taking Breo. Reports that she had covid and flu back to back this fall. Reports that food is not tasting as good. States that her nose is runny, eyes watery, dizziness. She is taking OTC allergy medication.   Reports that she has a very dry mouth. States that it is much improved with salagen and she would like to stay on it.    Outpatient Encounter Medications as of 12/09/2023  Medication Sig    ACCU-CHEK SOFTCLIX LANCETS lancets USE TO TEST BLOOD GLUCOSE THREE TIMES DAILY AS DIRECTED   ALPRAZolam (XANAX) 0.25 MG tablet Take 0.25 mg by mouth See admin instructions. Take 2 tablets at bedtime   aspirin (ASPIRIN LOW DOSE) 81 MG EC tablet Take 1 tablet (81 mg total) by mouth daily. (Needs to be seen before next refill)   atorvastatin (LIPITOR) 20 MG tablet Take 1 tablet (20 mg total) by mouth daily. (Patient taking differently: Take 40 mg by mouth daily.)   buPROPion (WELLBUTRIN XL) 300 MG 24 hr tablet Take 1 tablet (300 mg total) by mouth daily. (Patient taking differently: Take 150 mg by mouth 2 (two) times daily.)   cholecalciferol 25 MCG (1000 UT) tablet Take by mouth.   FLUoxetine (PROZAC) 40 MG capsule Take 1 capsule (40 mg total) by mouth daily.   fluticasone (FLONASE) 50 MCG/ACT nasal spray INSTILL 2 SPRAYS INTO EACH NOSTRIL EVERY DAY   fluticasone furoate-vilanterol (BREO ELLIPTA) 200-25 MCG/INH AEPB INHALE 1 PUFF INTO THE LUNGS DAILY   loratadine (CLARITIN) 10 MG tablet Take 1 tablet (10 mg total) by mouth daily.   meclizine (ANTIVERT) 12.5 MG tablet Take 12.5 mg by mouth 3 (three) times daily.   Melatonin 10 MG TABS Take 20 mg by mouth at bedtime.   metFORMIN (GLUCOPHAGE) 1000 MG tablet Take 1 tablet (1,000 mg  total) by mouth 2 (two) times daily.   methocarbamol (ROBAXIN) 500 MG tablet Take 0.5-1 tablets (250-500 mg total) by mouth 3 (three) times daily as needed for muscle spasms.   metoprolol succinate (TOPROL-XL) 25 MG 24 hr tablet Take 25 mg by mouth daily.   mirabegron ER ([name]) 50 MG TB24 tablet Take 1 tablet (50 mg total) by mouth daily.   montelukast (SINGULAIR) 10 MG tablet Take 10 mg by mouth daily.   nitroGLYCERIN (NITROSTAT) 0.4 MG SL tablet Place 0.4 mg under the tongue every 5 (five) minutes as needed for chest pain.   omeprazole (PRILOSEC) 40 MG capsule Take one capsule 30 minutes before breakfast   pilocarpine (SALAGEN) 5 MG tablet Take 1 tablet (5 mg total) by  mouth 2 (two) times daily. Needs to be seen before next refill   traZODone (DESYREL) 100 MG tablet TAKE 2 TABLETS(200 MG) BY MOUTH AT BEDTIME (Patient taking differently: Take 200 mg by mouth at bedtime as needed for sleep.)   VENTOLIN HFA 108 (90 Base) MCG/ACT inhaler INHALE 2 PUFFS INTO THE LUNGS EVERY 6 (SIX) HOURS AS NEEDED FOR WHEEZING OR SHORTNESS OF BREATH.   [DISCONTINUED] HYDROcodone-acetaminophen (NORCO) 5-325 MG tablet Take 0.5-1 tablets by mouth every 6 (six) hours as needed for severe pain. (Patient not taking: Reported on 05/25/2023)   [DISCONTINUED] lisinopril (ZESTRIL) 5 MG tablet Take 1 tablet (5 mg total) by mouth daily.   No facility-administered encounter medications on file as of 12/09/2023.    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---  oncologist-  dr [facility])   Left submandibular salivary gland--  T1 N0 M0,  Grade 2 w/ perineural invasion  s/p  resection 03-05-2015  and Radiation therapy 04-29-2015 to 06-11-2015   Allergic rhinitis    Anemia    Arthritis    Bipolar disorder (HCC)    [facility]   Breast cancer (HCC)    COPD (chronic obstructive pulmonary disease) (HCC)    Cystocele    Decreased sense of taste    secondary to radiation   GAD (generalized anxiety disorder)    GERD (gastroesophageal reflux disease)    Headache    Hemorrhoid    INTERNAL AND EXTERNAL     History of adenomatous polyp of colon    History of breast cancer dx 2007--- oncologist-- dr Roanna Banning--- no recurrence   Right breast DCIS ,  Stage 2 (T2 N0 M0) --  s/p  right partial mastecotmy w/ sln dissection,  chemotherapy complete 02-01-2007,  radiation completed 06-15-2007   History of esophageal dilatation    History of esophagitis    History of external beam radiation therapy    04-27-2007 to 06-15-2007 , right breast- 5040cGy in 28 sessions and boost 1200 cGy in 6 sessions/   04-29-2015 to 06-11-2015 , left  neck and skull base -- 60 Gy in 30 fractions   History of hiatal hernia    History of panic attacks    History of perforation of tympanic membrane    AFTER ACUTE OTITIS MEDIA 2016-- RESOLVED   Hypertriglyceridemia    ?   Loss of teeth due to extraction    secondary to radiation therapy---  x8 or 9   Mouth dryness    secondary to radiation   Neck pain    resuidaul neck pain from radiation   Peripheral neuropathy    Personal history of chemotherapy    Personal history of  radiation therapy    TMJ (temporomandibular joint disorder)    Type 2 diabetes mellitus (HCC)    Urge and stress incontinence    Wears glasses     Past Surgical History:  Procedure Laterality Date   COLONOSCOPY  03/30/2012   RMR: rectal and colonic polyps -removed as described above. Mellanosis coli   COLONOSCOPY WITH PROPOFOL N/A 05/25/2016   Procedure: COLONOSCOPY WITH PROPOFOL;  Surgeon: Corbin Ade, MD;  [facility];  Service: Endoscopy;  Laterality: N/A;  1400    ECTOPIC PREGNANCY SURGERY     ESOPHAGOGASTRODUODENOSCOPY (EGD) WITH PROPOFOL N/A 05/25/2016   Rourk: small hh, status post dilation of Schatzki ring, normal colon. Next colonoscopy June 2022   EXCISION / BIOPSY BREAST / NIPPLE / DUCT     INTERSTIM IMPLANT PLACEMENT N/A 08/11/2016   Serial Number [id number] H.Procedure: INTERSTIM IMPLANT FIRST STAGE;  Surgeon: Alfredo Martinez, MD;  [facility];  Service: Urology;  Laterality: N/A;   INTERSTIM IMPLANT PLACEMENT N/A 08/11/2016   Procedure: Leane Platt IMPLANT SECOND STAGE IMPEDANCE CHECK;  Surgeon: Alfredo Martinez, MD;  Location: [facility];  Service: Urology;  Laterality: N/A;   MALONEY DILATION N/A 05/25/2016   Procedure: Elease Hashimoto DILATION;  Surgeon: Corbin Ade, MD;  [facility];  Service: Endoscopy;  Laterality: N/A;   PARTIAL MASTECTOMY WITH AXILLARY SENTINEL LYMPH NODE BIOPSY Right 12/01/2006   and Right axilla node dissection x1    PORT-A-CATH PLACEMENT AND REMOVAL  12-31-2006/  05-13-2007   SUBMANDIBULAR GLAND EXCISION Left 03/05/2015   adenoid cystic carcinoma   TRANSTHORACIC ECHOCARDIOGRAM  12/29/2006   normal echo, ef 55%   TUBAL LIGATION  yrs ago   VAGINAL HYSTERECTOMY  10-26-2007  dr Despina Hidden   w/ Anterior Repair with intraspinous graft and vagainal vault suspension    Family History  Problem Relation Age of Onset   Diabetes Mother    Hypertension Mother    Cancer Mother        unknown primary   Bipolar disorder Mother    Colon cancer Paternal Grandmother 28       deceased   Diabetes Daughter 7       type 1   Depression Sister    Bipolar disorder Paternal Grandfather    Depression Sister    Liver disease Neg Hx     Social History   Socioeconomic History   Marital status: Widowed    Spouse name: Not on file   Number of children: 3   Years of education: Not on file   Highest education level: Not on file  Occupational History   Not on file  Tobacco Use   Smoking status: Former    Current packs/day: 0.00    Average packs/day: 1.5 packs/day for 25.0 years (37.5 ttl pk-yrs)    Types: Cigarettes    Start date: 01/08/1971    Quit date: 01/09/1996    Years since quitting: 27.9   Smokeless tobacco: Never  Vaping Use   Vaping status: Never Used  Substance and Sexual Activity   Alcohol use: No    Alcohol/week: 0.0 standard drinks of alcohol    Comment: 03-23-2017 per pt no but in the past. stopped 1983   Drug use: No    Comment: 03-23-17 per pt no but stopped in her 20s.   Sexual activity: Never  Other Topics Concern   Not on file  Social History Narrative   Not on file   Social Drivers of Health  Financial Resource Strain: Low Risk  (08/01/2019)   Overall Financial Resource Strain (CARDIA)    Difficulty of Paying Living Expenses: Not very hard  Food Insecurity: No Food Insecurity (03/10/2023)   Hunger Vital Sign    Worried About Running Out of Food in the Last Year: Never true    Ran Out of  Food in the Last Year: Never true  Transportation Needs: No Transportation Needs (03/10/2023)   PRAPARE - Administrator, Civil Service (Medical): No    Lack of Transportation (Non-Medical): No  Physical Activity: Inactive (08/01/2019)   Exercise Vital Sign    Days of Exercise per Week: 0 days    Minutes of Exercise per Session: 0 min  Stress: Stress Concern Present (08/01/2019)   [employer] of Occupational Health - Occupational Stress Questionnaire    Feeling of Stress : To some extent  Social Connections: Unknown (08/24/2023)   Received from [facility]   Social Network    Social Network: Not on file  Intimate Partner Violence: Unknown (08/24/2023)   Received from [facility]   HITS    Physically Hurt: Not on file    Insult or Talk Down To: Not on file    Threaten Physical Harm: Not on file    Scream or Curse: Not on file    ROS As per HPI   Objective   BP 114/74   Pulse 85   Temp 98.5 F (36.9 C)   Ht 5' 7.7" (1.72 m)   Wt 215 lb (97.5 kg)   SpO2 95%   BMI 32.98 kg/m   Physical Exam Constitutional:      General: She is awake. She is not in acute distress.    Appearance: Normal appearance. She is well-developed and well-groomed. She is not ill-appearing, toxic-appearing or diaphoretic.  Cardiovascular:     Rate and Rhythm: Normal rate and regular rhythm.     Pulses: Normal pulses.          Radial pulses are 2+ on the right side and 2+ on the left side.       Posterior tibial pulses are 2+ on the right side and 2+ on the left side.     Heart sounds: Normal heart sounds. No murmur heard.    No gallop.  Pulmonary:     Effort: Pulmonary effort is normal. No respiratory distress.     Breath sounds: Normal breath sounds. No stridor. No wheezing, rhonchi or rales.  Musculoskeletal:     Cervical back: Full passive range of motion without pain and neck supple.     Right lower leg: No edema.     Left lower leg: No edema.  Skin:    General: Skin is  warm.     Capillary Refill: Capillary refill takes less than 2 seconds.  Neurological:     General: No focal deficit present.     Mental Status: She is alert, oriented to person, place, and time and easily aroused. Mental status is at baseline.     GCS: GCS eye subscore is 4. GCS verbal subscore is 5. GCS motor subscore is 6.     Motor: No weakness.  Psychiatric:        Attention and Perception: Attention and perception normal.        Mood and Affect: Mood and affect normal.        Speech: Speech normal.        Behavior: Behavior normal. Behavior is cooperative.  Thought Content: Thought content normal. Thought content does not include homicidal or suicidal ideation. Thought content does not include homicidal or suicidal plan.        Judgment: Judgment normal.        12/09/2023    2:53 PM 12/09/2023    2:48 PM 03/10/2023    1:55 PM  Depression screen PHQ 2/9  Decreased Interest 3 3 3   Down, Depressed, Hopeless 3 3 3   PHQ - 2 Score 6 6 6   Altered sleeping 3 3   Tired, decreased energy 3 3   Change in appetite 3 3   Feeling bad or failure about yourself  3 3   Trouble concentrating 3 3   Moving slowly or fidgety/restless 0 0   Suicidal thoughts 1 1   PHQ-9 Score 22 22   Difficult doing work/chores Very difficult        12/09/2023    2:52 PM 04/23/2020   12:51 PM  GAD 7 : Generalized Anxiety Score  Nervous, Anxious, on Edge 3 2  Control/stop worrying 3 2  Worry too much - different things 3 2  Trouble relaxing 3 2  Restless 0 1  Easily annoyed or irritable 2 2  Afraid - awful might happen 1 3  Total GAD 7 Score 15 14  Anxiety Difficulty Very difficult Somewhat difficult    Assessment & Plan:  1. Chronic obstructive pulmonary disease, unspecified COPD type (HCC) (Primary) Will restart medication as below. Will consider switch to [facility], if patient does not see improvement.  - montelukast (SINGULAIR) 10 MG tablet; Take 1 tablet (10 mg total) by mouth daily.   Dispense: 30 tablet; Refill: 6  2. Hyperlipidemia associated with type 2 diabetes mellitus (HCC) Labs as below. Will communicate results to patient once available. Will await results to determine next steps.  Fasting status unknown.  - Lipid panel  3. Major depressive disorder, recurrent episode, moderate (HCC) Referral placed as below. States that she does not have active plans of self harm. Safety contract established. Will have close follow up to ensure that patient is connected with psychiatry. Patient self discontinued her medications earlier this year, did not wish to restart at this time. - Ambulatory referral to Psychiatry  4. Vitamin D deficiency Labs as below. Will communicate results to patient once available. Will await results to determine next steps.  - VITAMIN D 25 Hydroxy (Vit-D Deficiency, Fractures)  5. Insomnia, unspecified type Referral placed as below. Will provide bridge of medication as below given that I am concerned patient will have withdrawal as she has been taking it for several years. Reviewed PDMP. No red flags. Will collect toxassure. Patient aware this is a bridge.  - Ambulatory referral to Psychiatry - ALPRAZolam (XANAX) 0.25 MG tablet; Take 1 tablet (0.25 mg total) by mouth See admin instructions. Take 2 tablets at bedtime  Dispense: 60 tablet; Refill: 0  6. PTSD (post-traumatic stress disorder) Referral placed as below. States that she does not have active plans of self harm. Safety contract established. Will have close follow up to ensure that patient is connected with psychiatry. Patient self discontinued her medications earlier this year, did not wish to restart at this time. - Ambulatory referral to Psychiatry  7. Long-term current use of benzodiazepine Labs as below. Will communicate results to patient once available. Will await results to determine next steps.  - ToxASSURE Select 13 (MW), Urine  8. Type 2 diabetes mellitus with other specified  complication, without long-term current use  of insulin (HCC) Will collect labs as below and have patient follow up closely for health maintenance of Diabetes such as foot exam, eye exam.  - Microalbumin / creatinine urine ratio - Bayer DCA Hb A1c Waived  9. Dizziness Labs as below. Will communicate results to patient once available. Will await results to determine next steps.  - Anemia Profile B - CMP14+EGFR  10. Encounter for immunization - Flu Vaccine Trivalent High Dose (Fluad)  The above assessment and management plan was discussed with the patient. The patient verbalized understanding of and has agreed to the management plan using shared-decision making. Patient is aware to call the clinic if they develop any new symptoms or if symptoms fail to improve or worsen. Patient is aware when to return to the clinic for a follow-up visit. Patient educated on when it is appropriate to go to the emergency department.   Return in about 2 weeks (around 12/23/2023) for Chronic Condition Follow up.   Neale Burly, DNP-FNP [facility] [address] [place], [place] [address] [phone]

## 2023-12-10 LAB — ANEMIA PROFILE B
Basophils Absolute: 0 10*3/uL (ref 0.0–0.2)
Basos: 1 %
EOS (ABSOLUTE): 0.2 10*3/uL (ref 0.0–0.4)
Eos: 2 %
Ferritin: 15 ng/mL (ref 15–150)
Folate: 7.9 ng/mL (ref >3.0)
Hematocrit: 40.5 % (ref 34.0–46.6)
Hemoglobin: 12.8 g/dL (ref 11.1–15.9)
Immature Grans (Abs): 0 10*3/uL (ref 0.0–0.1)
Immature Granulocytes: 0 %
Iron Saturation: 7 % — CL (ref 15–55)
Iron: 32 ug/dL (ref 27–139)
Lymphocytes Absolute: 1.9 10*3/uL (ref 0.7–3.1)
Lymphs: 29 %
MCH: 27.8 pg (ref 26.6–33.0)
MCHC: 31.6 g/dL (ref 31.5–35.7)
MCV: 88 fL (ref 79–97)
Monocytes Absolute: 0.5 10*3/uL (ref 0.1–0.9)
Monocytes: 7 %
Neutrophils Absolute: 3.9 10*3/uL (ref 1.4–7.0)
Neutrophils: 61 %
Platelets: 332 10*3/uL (ref 150–450)
RBC: 4.6 x10E6/uL (ref 3.77–5.28)
RDW: 12 % (ref 11.7–15.4)
Retic Ct Pct: 1.1 % (ref 0.6–2.6)
Total Iron Binding Capacity: 459 ug/dL — ABNORMAL HIGH (ref 250–450)
UIBC: 427 ug/dL — ABNORMAL HIGH (ref 118–369)
Vitamin B-12: 389 pg/mL (ref 232–1245)
WBC: 6.5 10*3/uL (ref 3.4–10.8)

## 2023-12-10 LAB — MICROALBUMIN / CREATININE URINE RATIO
Creatinine, Urine: 51.2 mg/dL
Microalb/Creat Ratio: <6 mg/g{creat} (ref 0–29)
Microalbumin, Urine: <3 ug/mL

## 2023-12-10 LAB — CMP14+EGFR
ALT: 15 [IU]/L (ref 0–32)
AST: 15 [IU]/L (ref 0–40)
Albumin: 4.5 g/dL (ref 3.9–4.9)
Alkaline Phosphatase: 92 [IU]/L (ref 44–121)
BUN/Creatinine Ratio: 14 (ref 12–28)
BUN: 14 mg/dL (ref 8–27)
Bilirubin Total: <0.2 mg/dL (ref 0.0–1.2)
CO2: 22 mmol/L (ref 20–29)
Calcium: 9.7 mg/dL (ref 8.7–10.3)
Chloride: 98 mmol/L (ref 96–106)
Creatinine, Ser: 0.99 mg/dL (ref 0.57–1.00)
Globulin, Total: 2.3 g/dL (ref 1.5–4.5)
Glucose: 197 mg/dL — ABNORMAL HIGH (ref 70–99)
Potassium: 4.7 mmol/L (ref 3.5–5.2)
Sodium: 136 mmol/L (ref 134–144)
Total Protein: 6.8 g/dL (ref 6.0–8.5)
eGFR: 62 mL/min/{1.73_m2} (ref >59)

## 2023-12-10 LAB — LIPID PANEL
Chol/HDL Ratio: 4.1 {ratio} (ref 0.0–4.4)
Cholesterol, Total: 224 mg/dL — ABNORMAL HIGH (ref 100–199)
HDL: 54 mg/dL (ref >39)
LDL Chol Calc (NIH): 142 mg/dL — ABNORMAL HIGH (ref 0–99)
Triglycerides: 155 mg/dL — ABNORMAL HIGH (ref 0–149)
VLDL Cholesterol Cal: 28 mg/dL (ref 5–40)

## 2023-12-10 LAB — VITAMIN D 25 HYDROXY (VIT D DEFICIENCY, FRACTURES): Vit D, 25-Hydroxy: 26.9 ng/mL — ABNORMAL LOW (ref 30.0–100.0)

## 2023-12-10 NOTE — Progress Notes (Signed)
Still waiting on toxassure, but iron saturation low, however, patient is not anemic. Recommend pt start iron supplementation every other day with orange juice to improve absorption. BG is consistent with A1C. Cholesterol elevated. The 10-year ASCVD risk score (Arnett DK, et al., 2019) is: 16%. Patient has elevated risk score. Recommend that she increase dose of lipitor to 40 mg with goal of increasing to 80 mg. Diet encouraged - increase intake of fresh fruits and vegetables, increase intake of lean proteins. Bake, broil, or grill foods. Avoid fried, greasy, and fatty foods. Avoid fast foods. Increase intake of fiber-rich whole grains. Exercise encouraged - at least 150 minutes per week and advance as tolerated. Can also try red yeast rice and we will recheck in 3 months.  Vitamin D slightly decreased. Recommend [phone] international units daily.

## 2023-12-14 LAB — TOXASSURE SELECT 13 (MW), URINE

## 2023-12-16 NOTE — Progress Notes (Signed)
Patient positive for morphine on toxassure. This is not present on her PMP aware. I have referred her to psychiatry and she was approved by [facility]. I will not be able to provide her any other bridges or controlled substances. Please let her know about BH urgent care if she needs it in the future.   [facility] [place] [address], [place] [address] [phone]  [facility]  Phone:  [phone]  Address:  [address] [place] [address]  [place], [place] [address]  Hours:  Open 24/7, No appointment required.    The [facility] ([facility]) is centrally located in the [place] at [address] [place]

## 2023-12-24 ENCOUNTER — Ambulatory Visit: Payer: 59

## 2023-12-24 ENCOUNTER — Encounter: Payer: Self-pay | Admitting: Family Medicine

## 2023-12-24 ENCOUNTER — Other Ambulatory Visit: Payer: Self-pay | Admitting: Oral Surgery

## 2023-12-24 ENCOUNTER — Ambulatory Visit (INDEPENDENT_AMBULATORY_CARE_PROVIDER_SITE_OTHER): Payer: 59 | Admitting: Family Medicine

## 2023-12-24 VITALS — BP 132/79 | HR 92 | Temp 98.3°F | Ht 67.0 in | Wt 216.0 lb

## 2023-12-24 DIAGNOSIS — M272 Inflammatory conditions of jaws: Secondary | ICD-10-CM

## 2023-12-24 DIAGNOSIS — E1159 Type 2 diabetes mellitus with other circulatory complications: Secondary | ICD-10-CM | POA: Diagnosis not present

## 2023-12-24 DIAGNOSIS — R600 Localized edema: Secondary | ICD-10-CM | POA: Diagnosis not present

## 2023-12-24 DIAGNOSIS — F331 Major depressive disorder, recurrent, moderate: Secondary | ICD-10-CM

## 2023-12-24 DIAGNOSIS — J449 Chronic obstructive pulmonary disease, unspecified: Secondary | ICD-10-CM | POA: Diagnosis not present

## 2023-12-24 DIAGNOSIS — E1169 Type 2 diabetes mellitus with other specified complication: Secondary | ICD-10-CM | POA: Diagnosis not present

## 2023-12-24 DIAGNOSIS — Z7984 Long term (current) use of oral hypoglycemic drugs: Secondary | ICD-10-CM

## 2023-12-24 DIAGNOSIS — E559 Vitamin D deficiency, unspecified: Secondary | ICD-10-CM | POA: Diagnosis not present

## 2023-12-24 DIAGNOSIS — G5603 Carpal tunnel syndrome, bilateral upper limbs: Secondary | ICD-10-CM

## 2023-12-24 DIAGNOSIS — H539 Unspecified visual disturbance: Secondary | ICD-10-CM

## 2023-12-24 DIAGNOSIS — N3946 Mixed incontinence: Secondary | ICD-10-CM

## 2023-12-24 MED ORDER — EMPAGLIFLOZIN 10 MG PO TABS: 10.0000 mg | ORAL_TABLET | Freq: Every day | ORAL | 1 refills | Status: DC

## 2023-12-24 NOTE — Progress Notes (Signed)
 Subjective:  Patient ID: [name], female    DOB: 06/01/1956, 68 y.o.   MRN: [id number]  Patient Care Team: Cathlene Marry Lenis, FNP as PCP - General (Family Medicine) Shaaron Lamar HERO, MD (Gastroenterology) Rogers Hai, MD as Medical Oncologist (Medical Oncology) Celestia Joesph SQUIBB, RN as Oncology Nurse Navigator (Medical Oncology) Vicci Mcardle, [place] (Optometry)   Chief Complaint:  Medical Management of Chronic Issues   HPI: [name] is a 68 y.o. female presenting on 12/24/2023 for Medical Management of Chronic Issues  HPI Type 2 diabetes mellitus with other specified complication, without long-term current use of insulin (HCC) Glucometer: has not started checking BG at home.  Taking medication(s): metformin    Last eye exam: due  Last foot exam: due  Last A1c:  Lab Results  Component Value Date   HGBA1C 7.8 (H) 12/09/2023   Nephropathy screen indicated?: completed 11/2023 Last flu, zoster and/or pneumovax:  Immunization History  Administered Date(s) Administered   Fluad Trivalent(High Dose 65+) 12/09/2023   Influenza Split 10/21/2015   Influenza,inj,Quad PF,6+ Mos 10/05/2016, 12/28/2017   Pneumococcal Conjugate-13 01/16/2015, 07/05/2015   Pneumococcal Polysaccharide-23 04/23/2020   Tdap 01/16/2015   Zoster, Live 07/03/2016    ROS: Denies LOC, polydipsia, unintended weight loss/gain, foot ulcerations,  Endorses shortness of breath or chest pain, dizziness, polyuria, paresthesia  States that she uses inhaler when she cannot catch her breath. States that she has not been taking them as she should. She has not been compliant with Breo.  States that she only has chest pain when she is short of breath. She has recently restarted Singulair  and states that this has somewhat improved her symptoms.    States that she previously saw urology and has implanted device. She is wanting to follow up with them. Continues to take myrbetriq . Does not believe  that it is helping.   Hypertension associated with diabetes (HCC) Has BP monitor at home No ROS Denies palpitations States that vision is bothering her. She went to eye doctor in March 2024. She plans to go back in one year due to insurance coverage. However, reports increasingly blurry vision.  Continues to have peripheral edema. Does not wear compression stockings. States that she was previously having headaches daily. States that they have backed off slightly.  Meds metoprolol, needs refill on lisinopril .  CAD risks hypertension, hypercholesterolemia/hyperlipidemia   Vitamin D  deficiency  Has started taking vitamin D  supplementation along with multivitamin.   Bilateral hand pain  Reports that she has bilateral hand pain with numbness and tingling. Reports a history of carpal tunnel syndrome. Reports that it started after she had her son several years ago. Reports that she has not been wearing braces. She is taking celebrex . States that she feels she has decreased strength in her hands   Relevant past medical, surgical, family, and social history reviewed and updated as indicated.  Allergies and medications reviewed and updated. Data reviewed: Chart in Epic.   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---  oncologist-  dr lauraine golden [facility])   Left submandibular salivary gland--  T1 N0 M0,  Grade 2 w/ perineural invasion  s/p  resection 03-05-2015  and Radiation therapy 04-29-2015 to 06-11-2015   Allergic rhinitis    Anemia    Arthritis    Bipolar disorder (HCC)    [facility]   Breast cancer (HCC)    COPD (chronic obstructive pulmonary disease) (HCC)  Cystocele    Decreased sense of taste    secondary to radiation   GAD (generalized anxiety disorder)    GERD (gastroesophageal reflux disease)    Headache    Hemorrhoid    INTERNAL AND EXTERNAL     History of adenomatous polyp of colon    History of breast  cancer dx 2007--- oncologist-- dr lari--- no recurrence   Right breast DCIS ,  Stage 2 (T2 N0 M0) --  s/p  right partial mastecotmy w/ sln dissection,  chemotherapy complete 02-01-2007,  radiation completed 06-15-2007   History of esophageal dilatation    History of esophagitis    History of external beam radiation therapy    04-27-2007 to 06-15-2007 , right breast- 5040cGy in 28 sessions and boost 1200 cGy in 6 sessions/   04-29-2015 to 06-11-2015 , left neck and skull base -- 60 Gy in 30 fractions   History of hiatal hernia    History of panic attacks    History of perforation of tympanic membrane    AFTER ACUTE OTITIS MEDIA 2016-- RESOLVED   Hypertriglyceridemia    ?   Loss of teeth due to extraction    secondary to radiation therapy---  x8 or 9   Mouth dryness    secondary to radiation   Neck pain    resuidaul neck pain from radiation   Peripheral neuropathy    Personal history of chemotherapy    Personal history of radiation therapy    TMJ (temporomandibular joint disorder)    Type 2 diabetes mellitus (HCC)    Urge and stress incontinence    Wears glasses     Past Surgical History:  Procedure Laterality Date   COLONOSCOPY  03/30/2012   RMR: rectal and colonic polyps -removed as described above. Mellanosis coli   COLONOSCOPY WITH PROPOFOL  N/A 05/25/2016   Procedure: COLONOSCOPY WITH PROPOFOL ;  Surgeon: Lamar CHRISTELLA Hollingshead, MD;  [facility];  Service: Endoscopy;  Laterality: N/A;  1400    ECTOPIC PREGNANCY SURGERY     ESOPHAGOGASTRODUODENOSCOPY (EGD) WITH PROPOFOL  N/A 05/25/2016   Rourk: small hh, status post dilation of Schatzki ring, normal colon. Next colonoscopy June 2022   EXCISION / BIOPSY BREAST / NIPPLE / DUCT     INTERSTIM IMPLANT PLACEMENT N/A 08/11/2016   Serial Number [id number] H.Procedure: INTERSTIM IMPLANT FIRST STAGE;  Surgeon: Glendia Elizabeth, MD;  Location[facility];  Service: Urology;  Laterality: N/A;   INTERSTIM IMPLANT PLACEMENT  N/A 08/11/2016   Procedure: RENNA IMPLANT SECOND STAGE IMPEDANCE CHECK;  Surgeon: Glendia Elizabeth, MD;  Location[facility];  Service: Urology;  Laterality: N/A;   MALONEY DILATION N/A 05/25/2016   Procedure: AGAPITO DILATION;  Surgeon: Lamar CHRISTELLA Hollingshead, MD;  [facility];  Service: Endoscopy;  Laterality: N/A;   PARTIAL MASTECTOMY WITH AXILLARY SENTINEL LYMPH NODE BIOPSY Right 12/01/2006   and Right axilla node dissection x1   PORT-A-CATH PLACEMENT AND REMOVAL  12-31-2006/  05-13-2007   SUBMANDIBULAR GLAND EXCISION Left 03/05/2015   adenoid cystic carcinoma   TRANSTHORACIC ECHOCARDIOGRAM  12/29/2006   normal echo, ef 55%   TUBAL LIGATION  yrs ago   VAGINAL HYSTERECTOMY  10-26-2007  dr jayne   w/ Anterior Repair with intraspinous graft and vagainal vault suspension    Social History   Socioeconomic History   Marital status: Widowed    Spouse name: Not on file   Number of children: 3   Years of education: Not on file  Highest education level: Not on file  Occupational History   Not on file  Tobacco Use   Smoking status: Former    Current packs/day: 0.00    Average packs/day: 1.5 packs/day for 25.0 years (37.5 ttl pk-yrs)    Types: Cigarettes    Start date: 01/08/1971    Quit date: 01/09/1996    Years since quitting: 27.9   Smokeless tobacco: Never  Vaping Use   Vaping status: Never Used  Substance and Sexual Activity   Alcohol use: No    Alcohol/week: 0.0 standard drinks of alcohol    Comment: 03-23-2017 per pt no but in the past. stopped 1983   Drug use: No    Comment: 03-23-17 per pt no but stopped in her 20s.   Sexual activity: Never  Other Topics Concern   Not on file  Social History Narrative   Not on file   Social Drivers of Health   Financial Resource Strain: Low Risk  (08/01/2019)   Overall Financial Resource Strain (CARDIA)    Difficulty of Paying Living Expenses: Not very hard  Food Insecurity: No Food Insecurity (03/10/2023)    Hunger Vital Sign    Worried About Running Out of Food in the Last Year: Never true    Ran Out of Food in the Last Year: Never true  Transportation Needs: No Transportation Needs (03/10/2023)   PRAPARE - Administrator, Civil Service (Medical): No    Lack of Transportation (Non-Medical): No  Physical Activity: Inactive (08/01/2019)   Exercise Vital Sign    Days of Exercise per Week: 0 days    Minutes of Exercise per Session: 0 min  Stress: Stress Concern Present (08/01/2019)   [employer] of Occupational Health - Occupational Stress Questionnaire    Feeling of Stress : To some extent  Social Connections: Unknown (08/24/2023)   [facility] from [facility]   Social Network    Social Network: Not on file  Intimate Partner Violence: Unknown (08/24/2023)   Received from [facility]   HITS    Physically Hurt: Not on file    Insult or Talk Down To: Not on file    Threaten Physical Harm: Not on file    Scream or Curse: Not on file    Outpatient Encounter Medications as of 12/24/2023  Medication Sig   ACCU-CHEK SOFTCLIX LANCETS lancets USE TO TEST BLOOD GLUCOSE THREE TIMES DAILY AS DIRECTED   ALPRAZolam  (XANAX ) 0.25 MG tablet Take 1 tablet (0.25 mg total) by mouth See admin instructions. Take 2 tablets at bedtime   aspirin  (ASPIRIN  LOW DOSE) 81 MG EC tablet Take 1 tablet (81 mg total) by mouth daily. (Needs to be seen before next refill)   atorvastatin  (LIPITOR) 20 MG tablet Take 1 tablet (20 mg total) by mouth daily. (Patient taking differently: Take 40 mg by mouth daily.)   buPROPion  (WELLBUTRIN  XL) 300 MG 24 hr tablet Take 1 tablet (300 mg total) by mouth daily. (Patient taking differently: Take 150 mg by mouth 2 (two) times daily.)   celecoxib  (CELEBREX ) 200 MG capsule Take 200 mg by mouth 2 (two) times daily.   cholecalciferol 25 MCG (1000 UT) tablet Take by mouth.   FLUoxetine  (PROZAC ) 40 MG capsule Take 1 capsule (40 mg total) by mouth daily.   fluticasone  (FLONASE ) 50  MCG/ACT nasal spray INSTILL 2 SPRAYS INTO EACH NOSTRIL EVERY DAY   fluticasone  furoate-vilanterol (BREO ELLIPTA ) 200-25 MCG/INH AEPB INHALE 1 PUFF INTO THE LUNGS DAILY   meclizine (ANTIVERT)  12.5 MG tablet Take 12.5 mg by mouth 3 (three) times daily.   Melatonin 10 MG TABS Take 20 mg by mouth at bedtime.   metFORMIN  (GLUCOPHAGE ) 1000 MG tablet Take 1 tablet (1,000 mg total) by mouth 2 (two) times daily.   mirabegron  ER ([name] ) 50 MG TB24 tablet Take 1 tablet (50 mg total) by mouth daily.   montelukast  (SINGULAIR ) 10 MG tablet Take 1 tablet (10 mg total) by mouth daily.   nitroGLYCERIN (NITROSTAT) 0.4 MG SL tablet Place 0.4 mg under the tongue every 5 (five) minutes as needed for chest pain.   omeprazole  (PRILOSEC) 40 MG capsule Take one capsule 30 minutes before breakfast   pilocarpine  (SALAGEN ) 5 MG tablet Take 1 tablet (5 mg total) by mouth 2 (two) times daily. Needs to be seen before next refill   traZODone  (DESYREL ) 100 MG tablet TAKE 2 TABLETS(200 MG) BY MOUTH AT BEDTIME (Patient taking differently: Take 200 mg by mouth at bedtime as needed for sleep.)   VENTOLIN  HFA 108 (90 Base) MCG/ACT inhaler INHALE 2 PUFFS INTO THE LUNGS EVERY 6 (SIX) HOURS AS NEEDED FOR WHEEZING OR SHORTNESS OF BREATH.   cetirizine  (ZYRTEC ) 5 MG chewable tablet Chew 1 tablet (5 mg total) by mouth daily. (Patient not taking: Reported on 12/24/2023)   methocarbamol  (ROBAXIN ) 500 MG tablet Take 0.5-1 tablets (250-500 mg total) by mouth 3 (three) times daily as needed for muscle spasms. (Patient not taking: Reported on 12/24/2023)   metoprolol succinate (TOPROL-XL) 25 MG 24 hr tablet Take 25 mg by mouth daily. (Patient not taking: Reported on 12/24/2023)   No facility-administered encounter medications on file as of 12/24/2023.    Allergies  Allergen Reactions   Bee Venom Hives, Swelling and Other (See Comments)    Bee stings  Bee stings Bee stings    Penicillins Anaphylaxis and Swelling    Has patient had a PCN  reaction causing immediate rash, facial/tongue/throat swelling, SOB or lightheadedness with hypotension: Yes Has patient had a PCN reaction causing severe rash involving mucus membranes or skin necrosis: No Has patient had a PCN reaction that required hospitalization Yes Has patient had a PCN reaction occurring within the last 10 years: No If all of the above answers are NO, then may proceed with Cephalosporin use.     Review of Systems As per HPI  Objective:  BP 132/79   Pulse 92   Temp 98.3 F (36.8 C)   Ht 5' 7 (1.702 m)   Wt 216 lb (98 kg)   SpO2 94%   BMI 33.83 kg/m    Wt Readings from Last 3 Encounters:  12/24/23 216 lb (98 kg)  12/09/23 215 lb (97.5 kg)  03/18/23 205 lb (93 kg)   Physical Exam Constitutional:      General: She is awake. She is not in acute distress.    Appearance: Normal appearance. She is well-developed and well-groomed. She is not ill-appearing, toxic-appearing or diaphoretic.  Cardiovascular:     Rate and Rhythm: Normal rate and regular rhythm.     Pulses: Normal pulses.          Radial pulses are 2+ on the right side and 2+ on the left side.       Posterior tibial pulses are 2+ on the right side and 2+ on the left side.     Heart sounds: Normal heart sounds. No murmur heard.    No gallop.  Pulmonary:     Effort: Pulmonary effort is normal. No respiratory  distress.     Breath sounds: Normal breath sounds. No stridor. No wheezing, rhonchi or rales.  Musculoskeletal:     Right hand: Tenderness present. No swelling, deformity or lacerations. Decreased range of motion. Normal sensation. Normal capillary refill. Normal pulse.     Left hand: Tenderness present. No swelling, deformity or lacerations. Decreased range of motion. Normal sensation. Normal capillary refill. Normal pulse.     Cervical back: Full passive range of motion without pain and neck supple.     Right lower leg: No edema.     Left lower leg: No edema.     Right foot: Normal range  of motion. No deformity, bunion, Charcot foot, foot drop or prominent metatarsal heads.     Left foot: Normal range of motion. No deformity, bunion, Charcot foot, foot drop or prominent metatarsal heads.     Comments: Small ~30mm nodules, tender to palpation along palmar surface  Positive tinel and phalen tests bilaterally   Feet:     Right foot:     Protective Sensation: 10 sites tested.  10 sites sensed.     Skin integrity: Callus and dry skin present.     Toenail Condition: Right toenails are normal.     Left foot:     Protective Sensation: 10 sites tested.  10 sites sensed.     Skin integrity: Callus and dry skin present.     Toenail Condition: Left toenails are normal.  Skin:    General: Skin is warm.     Capillary Refill: Capillary refill takes less than 2 seconds.  Neurological:     General: No focal deficit present.     Mental Status: She is alert, oriented to person, place, and time and easily aroused. Mental status is at baseline.     GCS: GCS eye subscore is 4. GCS verbal subscore is 5. GCS motor subscore is 6.     Motor: No weakness.  Psychiatric:        Attention and Perception: Attention and perception normal.        Mood and Affect: Mood and affect normal.        Speech: Speech normal.        Behavior: Behavior normal. Behavior is cooperative.        Thought Content: Thought content normal. Thought content does not include homicidal or suicidal ideation. Thought content does not include homicidal or suicidal plan.        Cognition and Memory: Cognition and memory normal.        Judgment: Judgment normal.    Results for orders placed or performed in visit on 12/09/23  Bayer DCA Hb A1c Waived   Collection Time: 12/09/23  3:17 PM  Result Value Ref Range   HB A1C (BAYER DCA - WAIVED) 7.8 (H) 4.8 - 5.6 %  Anemia Profile B   Collection Time: 12/09/23  3:18 PM  Result Value Ref Range   Total Iron Binding Capacity 459 (H) 250 - 450 ug/dL   UIBC 572 (H) 881 - 630 ug/dL    Iron 32 27 - 860 ug/dL   Iron Saturation 7 (LL) 15 - 55 %   Ferritin 15 15 - 150 ng/mL   Vitamin B-12 389 232 - 1,245 pg/mL   Folate 7.9 >3.0 ng/mL   WBC 6.5 3.4 - 10.8 x10E3/uL   RBC 4.60 3.77 - 5.28 x10E6/uL   Hemoglobin 12.8 11.1 - 15.9 g/dL   Hematocrit 59.4 65.9 - 46.6 %   MCV 88  79 - 97 fL   MCH 27.8 26.6 - 33.0 pg   MCHC 31.6 31.5 - 35.7 g/dL   RDW 87.9 88.2 - 84.5 %   Platelets 332 150 - 450 x10E3/uL   Neutrophils 61 Not Estab. %   Lymphs 29 Not Estab. %   Monocytes 7 Not Estab. %   Eos 2 Not Estab. %   Basos 1 Not Estab. %   Neutrophils Absolute 3.9 1.4 - 7.0 x10E3/uL   Lymphocytes Absolute 1.9 0.7 - 3.1 x10E3/uL   Monocytes Absolute 0.5 0.1 - 0.9 x10E3/uL   EOS (ABSOLUTE) 0.2 0.0 - 0.4 x10E3/uL   Basophils Absolute 0.0 0.0 - 0.2 x10E3/uL   Immature Granulocytes 0 Not Estab. %   Immature Grans (Abs) 0.0 0.0 - 0.1 x10E3/uL   Retic Ct Pct 1.1 0.6 - 2.6 %  CMP14+EGFR   Collection Time: 12/09/23  3:18 PM  Result Value Ref Range   Glucose 197 (H) 70 - 99 mg/dL   BUN 14 8 - 27 mg/dL   Creatinine, Ser 9.00 0.57 - 1.00 mg/dL   eGFR 62 >40 fO/fpw/8.26   BUN/Creatinine Ratio 14 12 - 28   Sodium 136 134 - 144 mmol/L   Potassium 4.7 3.5 - 5.2 mmol/L   Chloride 98 96 - 106 mmol/L   CO2 22 20 - 29 mmol/L   Calcium  9.7 8.7 - 10.3 mg/dL   Total Protein 6.8 6.0 - 8.5 g/dL   Albumin 4.5 3.9 - 4.9 g/dL   Globulin, Total 2.3 1.5 - 4.5 g/dL   Bilirubin Total <9.7 0.0 - 1.2 mg/dL   Alkaline Phosphatase 92 44 - 121 IU/L   AST 15 0 - 40 IU/L   ALT 15 0 - 32 IU/L  Lipid panel   Collection Time: 12/09/23  3:18 PM  Result Value Ref Range   Cholesterol, Total 224 (H) 100 - 199 mg/dL   Triglycerides 844 (H) 0 - 149 mg/dL   HDL 54 >60 mg/dL   VLDL Cholesterol Cal 28 5 - 40 mg/dL   LDL Chol Calc (NIH) 857 (H) 0 - 99 mg/dL   Chol/HDL Ratio 4.1 0.0 - 4.4 ratio  VITAMIN D  25 Hydroxy (Vit-D Deficiency, Fractures)   Collection Time: 12/09/23  3:18 PM  Result Value Ref Range   Vit D,  25-Hydroxy 26.9 (L) 30.0 - 100.0 ng/mL  Microalbumin / creatinine urine ratio   Collection Time: 12/09/23  3:19 PM  Result Value Ref Range   Creatinine, Urine 51.2 Not Estab. mg/dL   Microalbumin, Urine <6.9 Not Estab. ug/mL   Microalb/Creat Ratio <6 0 - 29 mg/g creat  ToxASSURE Select 13 (MW), Urine   Collection Time: 12/09/23  3:19 PM  Result Value Ref Range   Summary FINAL        12/24/2023    2:44 PM 12/09/2023    2:53 PM 12/09/2023    2:48 PM 03/10/2023    1:55 PM 03/10/2023    1:54 PM  Depression screen PHQ 2/9  Decreased Interest 3 3 3 3 3   Down, Depressed, Hopeless 3 3 3 3 3   PHQ - 2 Score 6 6 6 6 6   Altered sleeping 3 3 3     Tired, decreased energy 3 3 3     Change in appetite 3 3 3     Feeling bad or failure about yourself  3 3 3     Trouble concentrating 3 3 3     Moving slowly or fidgety/restless 0 0 0    Suicidal  thoughts 0 1 1    PHQ-9 Score 21 22 22     Difficult doing work/chores Extremely dIfficult Very difficult          12/24/2023    2:45 PM 12/09/2023    2:52 PM 04/23/2020   12:51 PM  GAD 7 : Generalized Anxiety Score  Nervous, Anxious, on Edge 3 3 2   Control/stop worrying 3 3 2   Worry too much - different things 3 3 2   Trouble relaxing 3 3 2   Restless 2 0 1  Easily annoyed or irritable 2 2 2   Afraid - awful might happen 2 1 3   Total GAD 7 Score 18 15 14   Anxiety Difficulty Extremely difficult Very difficult Somewhat difficult    The 10-year ASCVD risk score (Arnett DK, et al., 2019) is: 20.8%   Values used to calculate the score:     Age: 80 years     Sex: Female     Is Non-Hispanic African American: No     Diabetic: Yes     Tobacco smoker: No     Systolic Blood Pressure: 132 mmHg     Is BP treated: Yes     HDL Cholesterol: 54 mg/dL     Total Cholesterol: 224 mg/dL   Pertinent labs & imaging results that were available during my care of the patient were reviewed by me and considered in my medical decision making.  Assessment & Plan:  Aliz  was seen today for medical management of chronic issues.  Diagnoses and all orders for this visit:  1. Type 2 diabetes mellitus with other specified complication, without long-term current use of insulin (HCC) (Primary) Not at goal at last A1C. Will start medication as below, especially given elevated ASCVD risk score. Patient to monitor BG daily. Patient has supplies. Encouraged patient to continue diet and lifestyle modifications.  - empagliflozin  (JARDIANCE ) 10 MG TABS tablet; Take 1 tablet (10 mg total) by mouth daily before breakfast.  Dispense: 30 tablet; Refill: 1  2. Hypertension associated with diabetes (HCC) Patient slightly above goal. Recommended patient to monitor BP at home and report measurements to PCP. Ordered supplies as below.  - For home use only DME Other see comment  3. Chronic obstructive pulmonary disease, unspecified COPD type (HCC) Encouraged patient to take medications as prescribed. Will have close follow up, if symptoms continue will refer to pulmonology.   4. Major depressive disorder, recurrent episode, moderate (HCC) Previously referred to psychiatry. Approved for [facility]. Provided contact information for patient to follow up. Denies SI. Safety contract established   5. Bilateral carpal tunnel syndrome Encouraged patient to purchase soft wrist brace with thumb support. Will not prescribe NSAID at this time as she is already taking celebrex . Will not start prednisone  at this time due to diabetes.   6. Mixed incontinence urge and stress Reviewed notes from MacDiarmid, MD on 10/30/2019. Will refer patient as below so that she can get re-established.  - Ambulatory referral to Urology  7. Vitamin D  deficiency Will repeat labs at future appt. Patient is currently taking OTC supplementation.   8. Bilateral lower extremity edema Encouraged patient to start with compression stockings, elevating legs, and ambulating.  - Compression stockings  9. Vision  abnormalities Referral placed as below.  - Ambulatory referral to Ophthalmology    Continue all other maintenance medications.  Follow up plan: Return in about 3 months (around 03/23/2024) for Chronic Condition Follow up.   Continue healthy lifestyle choices, including diet (rich in fruits, vegetables,  and lean proteins, and low in salt and simple carbohydrates) and exercise (at least 30 minutes of moderate physical activity daily).  Written and verbal instructions provided   The above assessment and management plan was discussed with the patient. The patient verbalized understanding of and has agreed to the management plan. Patient is aware to call the clinic if they develop any new symptoms or if symptoms persist or worsen. Patient is aware when to return to the clinic for a follow-up visit. Patient educated on when it is appropriate to go to the emergency department.   Marry Kins, DNP-FNP [facility] [address] [place], [place] [address] [phone]

## 2023-12-24 NOTE — Progress Notes (Signed)
 Unable to complete diabetic eye exam due to patient's eyes not dilating

## 2023-12-24 NOTE — Patient Instructions (Addendum)
 [facility] [address] [place] [address] [phone]  Monitoring your BP at home   Your BP was elevated today in office  Please keep a log of your BP at home.  The best time to take BP is 1st thing in the morning after waking.  Sit for 5 minutes with feet flat on the floor, arm at heart level.  Options for BP cuffs are at [employer], [employer], [employer], [facility] & [facility].  We will review measurements at follow up and determine plan for BP management.  If you have access, you can send a message in MyChart with your measurements prior to your follow up appointment.  The brand I recommend to get is Omron (Bronze).

## 2023-12-28 ENCOUNTER — Encounter (HOSPITAL_BASED_OUTPATIENT_CLINIC_OR_DEPARTMENT_OTHER): Payer: 59 | Admitting: General Surgery

## 2023-12-29 ENCOUNTER — Encounter (HOSPITAL_BASED_OUTPATIENT_CLINIC_OR_DEPARTMENT_OTHER): Payer: Self-pay

## 2023-12-29 ENCOUNTER — Telehealth: Payer: Self-pay

## 2023-12-29 NOTE — Telephone Encounter (Signed)
 Copied from CRM [id number]. Topic: Clinical - Medical Advice >> Dec 29, 2023  1:22 PM Zacyrah J wrote: Reason for CRM: Pt was seen on on 1/3 and was tested positive for morphine. Pt was told they would look into see why pt was positive for morphine. Pt would like to know is anything been done about it and is really concern about this. Pt also stated she hasn't had any morphine

## 2023-12-30 ENCOUNTER — Other Ambulatory Visit: Payer: Self-pay | Admitting: Family Medicine

## 2023-12-30 ENCOUNTER — Telehealth: Payer: Self-pay | Admitting: Family Medicine

## 2023-12-30 DIAGNOSIS — Z0389 Encounter for observation for other suspected diseases and conditions ruled out: Secondary | ICD-10-CM | POA: Diagnosis not present

## 2023-12-30 DIAGNOSIS — G4700 Insomnia, unspecified: Secondary | ICD-10-CM

## 2023-12-30 NOTE — Telephone Encounter (Signed)
 Spoke to patient and informed that a call has been placed to [facility] toxicologist. We are waiting for a response.  Patient is adament that she has not taken anything that might result in having morphine in her system.

## 2023-12-30 NOTE — Telephone Encounter (Signed)
 Copied from CRM [id number]. Topic: Clinical - Lab/Test Results >> Dec 30, 2023  1:33 PM Montie POUR wrote: Reason for CRM: [name] is calling back and is very upset that she was seen on 12/09/23 and her blood work results tested positive for morphine. Dr. Verlan called and gave her the results about testing positive for morphine. 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 Please have doctor or nurse call her as soon as possible at [phone].

## 2023-12-31 ENCOUNTER — Other Ambulatory Visit: Payer: Self-pay | Admitting: Family Medicine

## 2023-12-31 DIAGNOSIS — E1169 Type 2 diabetes mellitus with other specified complication: Secondary | ICD-10-CM

## 2024-01-03 ENCOUNTER — Encounter (HOSPITAL_BASED_OUTPATIENT_CLINIC_OR_DEPARTMENT_OTHER): Payer: 59 | Admitting: Internal Medicine

## 2024-02-17 ENCOUNTER — Ambulatory Visit: Payer: Self-pay | Admitting: Family Medicine

## 2024-02-17 NOTE — Telephone Encounter (Signed)
 Copied from CRM [id number]. Topic: General - Other >> Feb 17, 2024  3:19 PM Turkey B wrote: Reason for CRM: pt called in ask for office manager, because she is disturbed about being told morphine was found in her blood, but she says she isnt taking morphine. Please cab

## 2024-02-17 NOTE — Telephone Encounter (Addendum)
 Chief Complaint: SOB Symptoms: SOB, chest tightness, nausea, sweating, anxiety, difficulty concentrating, forgetfulness Frequency: Constant Pertinent Negatives: Patient denies Chest pain, vomiting, seizure, fever, wheezing, jaw pain, left arm pain Disposition: [] ED /[] Urgent Care (no appt availability in office) / [x] Appointment(In office/virtual)/ []  [facility]/ [] Home Care/ [] Refused Recommended Disposition /[] [facility]/ []  Follow-up with PCP Additional Notes: Patient called with complaints of Xanax withdrawal symptoms that started a week ago. Patient states that she had her blood drawn more than a month ago and results incorrectly showed morphine in her system. Patient denies ever having morphine and states she now cannot get her xanax prescription refilled forcing her to abruptly discontinue. Patient states that she believes she had her last dose at least two weeks ago, however had trouble remembering, and states she has taken Xanax at small doses at night to help her sleep for over 20 years. Patient states she has now been having what she believes are mild withdrawal symptoms that include trouble concentrating, sweating episodes, hot/cold temperature fluctuations, forgetfulness, and increased anxiety. Patient states that the only symptoms present now are chest tightness and some SOB that also started about a week ago. Patient states that her chest tightness is constant and moderate, and she has a history of COPD that she manages with inhalers but are no longer working as effectively Xanax discontinuation. Patient describes breathing as, "I find myself wanting to sit down but I don't want to do that cause I have chores to do. It just feels like my chest is tight and I sometimes have to take a deep breath while talking to maintain. But I'm not having like a heart attack. I've also had a long history with COPD." Patient spoke in long, full sentences, states her symptoms are  moderate, could be heard walking around her kitchen during triage, and did not appear in distress. Patient denied chest pain, fever, current sweating, current tremors, current nausea, current wheezing, current coughing. Patient states she is concerned about getting her medication so that she can properly be weaned off. Patient advised by this RN to be seen within 3 days per protocol to which patient was agreeable. Appt scheduled. Patient advised by this RN to call back with worsening symptoms. Patient verbalized understanding.   Copied from CRM [id number]. Topic: Clinical - Red Word Triage >> Feb 17, 2024  3:15 PM [name] wrote: [name] called in, has shortness of breath, she says from withdrawing from aprazolam Reason for Disposition  [1] MODERATE longstanding difficulty breathing (e.g., speaks in phrases, SOB even at rest, pulse 100-120) AND [2] SAME as normal  Answer Assessment - Initial Assessment Questions 1. RESPIRATORY STATUS: "Describe your breathing?" (e.g., wheezing, shortness of breath, unable to speak, severe coughing)      SOB "It feels like something is on my chest." 2. ONSET: "When did this breathing problem begin?"      About one week ago 3. PATTERN "Does the difficult breathing come and go, or has it been constant since it started?"      "And it feels like it's here all the time." 4. SEVERITY: "How bad is your breathing?" (e.g., mild, moderate, severe)    - MILD: No SOB at rest, mild SOB with walking, speaks normally in sentences, can lie down, no retractions, pulse < 100.    - MODERATE: SOB at rest, SOB with minimal exertion and prefers to sit, cannot lie down flat, speaks in phrases, mild retractions, audible wheezing, pulse 100-120.    -  SEVERE: Very SOB at rest, speaks in single words, struggling to breathe, sitting hunched forward, retractions, pulse > 120      Moderate  5. RECURRENT SYMPTOM: "Have you had difficulty breathing before?" If Yes, ask: "When was the last time?" and  "What happened that time?"      Denies 6. CARDIAC HISTORY: "Do you have any history of heart disease?" (e.g., heart attack, angina, bypass surgery, angioplasty)      Denies 7. LUNG HISTORY: "Do you have any history of lung disease?"  (e.g., pulmonary embolus, asthma, emphysema)     COPD "I've had attacks and I had to go to the hospital to get treatment done, but I haven't had that in years. Usually I use my inhale and it works but its not helping this time." 8. CAUSE: "What do you think is causing the breathing problem?"      "Taking alprazolam for about 22 years, and they said they found some morphine in my system but it was a mistake and  9. OTHER SYMPTOMS: "Do you have any other symptoms? (e.g., dizziness, runny nose, cough, chest pain, fever)     Nausea, dry mouth (cause of the cancer I had in my throat but it seems like it's worse), anxious, "I feel like I get chest tightness (NOW). It's moderate," tiredness, hot/cold sensation, sweating, upset stomach. 10. O2 SATURATION MONITOR:  "Do you use an oxygen saturation monitor (pulse oximeter) at home?" If Yes, ask: "What is your reading (oxygen level) today?" "What is your usual oxygen saturation reading?" (e.g., 95%)       Denies Last took Alprazolam at least two weeks ago  Protocols used: Breathing Difficulty-A-AH

## 2024-02-18 ENCOUNTER — Telehealth: Payer: Self-pay | Admitting: Family Medicine

## 2024-02-18 ENCOUNTER — Encounter: Payer: Self-pay | Admitting: Family Medicine

## 2024-02-18 ENCOUNTER — Ambulatory Visit (INDEPENDENT_AMBULATORY_CARE_PROVIDER_SITE_OTHER): Payer: 59 | Admitting: Family Medicine

## 2024-02-18 VITALS — BP 150/75 | HR 98 | Ht 67.0 in | Wt 216.0 lb

## 2024-02-18 DIAGNOSIS — J441 Chronic obstructive pulmonary disease with (acute) exacerbation: Secondary | ICD-10-CM | POA: Diagnosis not present

## 2024-02-18 DIAGNOSIS — J0110 Acute frontal sinusitis, unspecified: Secondary | ICD-10-CM | POA: Diagnosis not present

## 2024-02-18 DIAGNOSIS — Z0279 Encounter for issue of other medical certificate: Secondary | ICD-10-CM

## 2024-02-18 MED ORDER — DOXYCYCLINE HYCLATE 100 MG PO TABS: 100.0000 mg | ORAL_TABLET | Freq: Two times a day (BID) | ORAL | 0 refills | Status: DC

## 2024-02-18 MED ORDER — PREDNISONE 20 MG PO TABS: ORAL_TABLET | ORAL | 0 refills | Status: DC

## 2024-02-18 NOTE — Progress Notes (Signed)
 BP (!) 150/75   Pulse 98   Ht 5\' 7"  (1.702 m)   Wt 216 lb (98 kg)   SpO2 96%   BMI 33.83 kg/m    Subjective:   Patient ID: [name], female    DOB: 02-Oct-1956, 68 y.o.   MRN: [id number]  HPI: [name] is a 68 y.o. female presenting on 02/18/2024 for trouble breathing (Chest feels tight. Believes that she is having withdrawals from Xanax.)   HPI Cough and congestion and also irritability. Patient has been having some cough and congestion and sinus pressure and some shortness of breath it has been going on for the past week and a half.  She also feels a little brain fog and stuff this been going on before that because she feels like she is withdrawing from her benzo.  She says is been 2 weeks since she had a benzodiazepine dose.  She still feels like she has some irritability and fogginess.  She has not had any seizures or loss of consciousness or any other major symptoms.  She does complain a lot of the cough and sinus pressure and sinus headache and drainage and her cough has been productive.  She denies fevers or chills.  She denies body aches.  She does feel like her chest feels a little tight with the breathing.  Relevant past medical, surgical, family and social history reviewed and updated as indicated. Interim medical history since our last visit reviewed. Allergies and medications reviewed and updated.  Review of Systems  Constitutional:  Negative for chills and fever.  HENT:  Positive for congestion, postnasal drip, rhinorrhea, sinus pressure, sneezing and sore throat. Negative for ear discharge and ear pain.   Eyes:  Negative for redness and visual disturbance.  Respiratory:  Negative for chest tightness and shortness of breath.   Cardiovascular:  Negative for chest pain and leg swelling.  Genitourinary:  Negative for difficulty urinating and dysuria.  Musculoskeletal:  Negative for back pain and gait problem.  Skin:  Negative for rash.  Neurological:   Negative for light-headedness and headaches.  Psychiatric/Behavioral:  Negative for agitation and behavioral problems.   All other systems reviewed and are negative.   Per HPI unless specifically indicated above   Allergies as of 02/18/2024       Reactions   Bee Venom Hives, Swelling, Other (See Comments)   Bee stings Bee stings Bee stings   Penicillins Anaphylaxis, Swelling   Has patient had a PCN reaction causing immediate rash, facial/tongue/throat swelling, SOB or lightheadedness with hypotension: Yes Has patient had a PCN reaction causing severe rash involving mucus membranes or skin necrosis: No Has patient had a PCN reaction that required hospitalization Yes Has patient had a PCN reaction occurring within the last 10 years: No If all of the above answers are "NO", then may proceed with Cephalosporin use.        Medication List        Accurate as of February 18, 2024  3:02 PM. If you have any questions, ask your nurse or doctor.          STOP taking these medications    ALPRAZolam 0.25 MG tablet Commonly known as: XANAX Stopped by: Elige Radon Taijuan Serviss   buPROPion 300 MG 24 hr tablet Commonly known as: WELLBUTRIN XL Stopped by: Elige Radon Karen Kinnard   celecoxib 200 MG capsule Commonly known as: CELEBREX Stopped by: Elige Radon Jakira Mcfadden   empagliflozin 10 MG Tabs tablet Commonly known as:  Jardiance Stopped by: Elige Radon Jermel Artley   meclizine 12.5 MG tablet Commonly known as: ANTIVERT Stopped by: Elige Radon Jahnessa Vanduyn       TAKE these medications    Accu-Chek Softclix Lancets lancets USE TO TEST BLOOD GLUCOSE THREE TIMES DAILY AS DIRECTED   aspirin EC 81 MG tablet Commonly known as: Aspirin Low Dose Take 1 tablet (81 mg total) by mouth daily. (Needs to be seen before next refill)   atorvastatin 20 MG tablet Commonly known as: LIPITOR Take 1 tablet (20 mg total) by mouth daily. What changed: how much to take   Breo Ellipta 200-25 MCG/INH Aepb Generic  drug: fluticasone furoate-vilanterol INHALE 1 PUFF INTO THE LUNGS DAILY   cetirizine 5 MG chewable tablet Commonly known as: ZYRTEC Chew 1 tablet (5 mg total) by mouth daily.   cholecalciferol 25 MCG (1000 UNIT) tablet Commonly known as: VITAMIN D3 Take by mouth.   doxycycline 100 MG tablet Commonly known as: VIBRA-TABS Take 1 tablet (100 mg total) by mouth 2 (two) times daily. 1 po bid Started by: Elige Radon Zebulin Siegel   FLUoxetine 40 MG capsule Commonly known as: PROZAC Take 1 capsule (40 mg total) by mouth daily.   fluticasone 50 MCG/ACT nasal spray Commonly known as: FLONASE INSTILL 2 SPRAYS INTO EACH NOSTRIL EVERY DAY   Melatonin 10 MG Tabs Take 20 mg by mouth at bedtime.   metFORMIN 1000 MG tablet Commonly known as: GLUCOPHAGE Take 1 tablet (1,000 mg total) by mouth 2 (two) times daily.   methocarbamol 500 MG tablet Commonly known as: ROBAXIN Take 0.5-1 tablets (250-500 mg total) by mouth 3 (three) times daily as needed for muscle spasms.   metoprolol succinate 25 MG 24 hr tablet Commonly known as: TOPROL-XL Take 25 mg by mouth daily.   mirabegron ER 50 MG Tb24 tablet Commonly known as: [name] Take 1 tablet (50 mg total) by mouth daily.   montelukast 10 MG tablet Commonly known as: SINGULAIR Take 1 tablet (10 mg total) by mouth daily.   nitroGLYCERIN 0.4 MG SL tablet Commonly known as: NITROSTAT Place 0.4 mg under the tongue every 5 (five) minutes as needed for chest pain.   omeprazole 40 MG capsule Commonly known as: PRILOSEC Take one capsule 30 minutes before breakfast   pilocarpine 5 MG tablet Commonly known as: SALAGEN Take 1 tablet (5 mg total) by mouth 2 (two) times daily. Needs to be seen before next refill   predniSONE 20 MG tablet Commonly known as: DELTASONE 2 po at same time daily for 5 days Started by: Elige Radon Fedrick Cefalu   traZODone 100 MG tablet Commonly known as: DESYREL TAKE 2 TABLETS(200 MG) BY MOUTH AT BEDTIME What changed: See  the new instructions.   Ventolin HFA 108 (90 Base) MCG/ACT inhaler Generic drug: albuterol INHALE 2 PUFFS INTO THE LUNGS EVERY 6 (SIX) HOURS AS NEEDED FOR WHEEZING OR SHORTNESS OF BREATH.         Objective:   BP (!) 150/75   Pulse 98   Ht 5\' 7"  (1.702 m)   Wt 216 lb (98 kg)   SpO2 96%   BMI 33.83 kg/m   Wt Readings from Last 3 Encounters:  02/18/24 216 lb (98 kg)  12/24/23 216 lb (98 kg)  12/09/23 215 lb (97.5 kg)    Physical Exam Vitals and nursing note reviewed.  Constitutional:      General: She is not in acute distress.    Appearance: She is well-developed. She is not diaphoretic.  HENT:  Nose:     Right Sinus: Maxillary sinus tenderness and frontal sinus tenderness present.     Left Sinus: Maxillary sinus tenderness and frontal sinus tenderness present.     Mouth/Throat:     Mouth: Mucous membranes are moist.     Pharynx: Oropharynx is clear. Posterior oropharyngeal erythema present. No oropharyngeal exudate.  Eyes:     Conjunctiva/sclera: Conjunctivae normal.  Cardiovascular:     Rate and Rhythm: Normal rate and regular rhythm.     Heart sounds: Normal heart sounds. No murmur heard. Pulmonary:     Effort: Pulmonary effort is normal. No respiratory distress.     Breath sounds: Rhonchi present. No wheezing or rales.  Chest:     Chest wall: No tenderness.  Musculoskeletal:        General: No swelling. Normal range of motion.  Skin:    General: Skin is warm and dry.     Findings: No rash.  Neurological:     Mental Status: She is alert and oriented to person, place, and time.     Coordination: Coordination normal.  Psychiatric:        Behavior: Behavior normal.       Assessment & Plan:   Problem List Items Addressed This Visit   None Visit Diagnoses       Acute non-recurrent frontal sinusitis    -  Primary   Relevant Medications   doxycycline (VIBRA-TABS) 100 MG tablet   predniSONE (DELTASONE) 20 MG tablet     COPD exacerbation (HCC)        Relevant Medications   doxycycline (VIBRA-TABS) 100 MG tablet   predniSONE (DELTASONE) 20 MG tablet       Sent doxycycline and prednisone in for the patient.  This should treat her respiratory status.  Been more than 2 weeks since she has had any of the benzodiazepines that she should be getting over any withdrawals that she had soon.  Drink plenty of fluids Follow up plan: Return if symptoms worsen or fail to improve.  Counseling provided for all of the vaccine components No orders of the defined types were placed in this encounter.   Arville Care, MD [facility] 02/18/2024, 3:02 PM

## 2024-02-18 NOTE — Telephone Encounter (Signed)
 pt dropped off handicap forms to be completed and signed.  Form Fee Paid? (Y/N)       yes     If NO, form is placed on front office manager desk to hold until payment received. If YES, then form will be placed in the RX/HH Nurse Coordinators box for completion.  Form will not be processed until payment is received

## 2024-02-21 ENCOUNTER — Other Ambulatory Visit: Payer: Self-pay | Admitting: Family Medicine

## 2024-02-21 ENCOUNTER — Telehealth: Payer: Self-pay | Admitting: Family Medicine

## 2024-02-21 DIAGNOSIS — J0110 Acute frontal sinusitis, unspecified: Secondary | ICD-10-CM

## 2024-02-21 DIAGNOSIS — J441 Chronic obstructive pulmonary disease with (acute) exacerbation: Secondary | ICD-10-CM

## 2024-02-21 MED ORDER — DOXYCYCLINE HYCLATE 100 MG PO TABS: 100.0000 mg | ORAL_TABLET | Freq: Two times a day (BID) | ORAL | 0 refills | Status: AC

## 2024-02-21 MED ORDER — PREDNISONE 20 MG PO TABS: ORAL_TABLET | ORAL | 0 refills | Status: AC

## 2024-02-21 NOTE — Telephone Encounter (Signed)
 Copied from CRM [id number]. Topic: Clinical - Prescription Issue >> Feb 21, 2024  1:54 PM [name] wrote: Reason for CRM:  Patient seen Dr Dettinger on 02/18/2024 for a sinus infection and he would call in two prescriptions and none have been filled after she has checked the pharm a few times, upon research the medications possible sent to the wrong pharmarcy. Patient wants the fill sent to [facility] [place], [place] - [address] doxycycline (VIBRA-TABS) 100 MG tablet predniSONE (DELTASONE) 20 MG tablet

## 2024-02-21 NOTE — Telephone Encounter (Signed)
 Resent prescriptions to [facility] pharmacy and pt aware.

## 2024-02-21 NOTE — Telephone Encounter (Signed)
 Copied from CRM [id number]. Topic: Clinical - Medication Refill >> Feb 21, 2024  1:21 PM DeAngela L wrote: Most Recent Primary Care Visit:  Provider: Arville Care A  Department: [facility]  Visit Type: ACUTE  Date: 02/18/2024  Medication: Patient seen Dr Dettinger on 02/18/2024 for a sinus infection and he would call in two prescriptions and none have been filled after she has checked the pharm a few times   Has the patient contacted their pharmacy? Yes (Agent: If no, request that the patient contact the pharmacy for the refill. If patient does not wish to contact the pharmacy document the reason why and proceed with request.) (Agent: If yes, when and what did the pharmacy advise?)  Is this the correct pharmacy for this prescription? Yes If no, delete pharmacy and type the correct one.  This is the patient's preferred pharmacy:   [facility] - [place], [place] - [address] [place] [address] Phone: [phone] Fax: [phone]    Has the prescription been filled recently? No  Is the patient out of the medication? Yes  Has the patient been seen for an appointment in the last year OR does the patient have an upcoming appointment? Yes  Can we respond through MyChart? Yes  Agent: Please be advised that Rx refills may take up to 3 business days. We ask that you follow-up with your pharmacy.

## 2024-02-21 NOTE — Telephone Encounter (Signed)
Aware handicap form ready

## 2024-02-22 ENCOUNTER — Ambulatory Visit: Payer: 59 | Admitting: Family Medicine

## 2024-03-24 ENCOUNTER — Telehealth: Payer: Self-pay | Admitting: Family Medicine

## 2024-05-08 DIAGNOSIS — R350 Frequency of micturition: Secondary | ICD-10-CM | POA: Diagnosis not present

## 2024-05-08 DIAGNOSIS — R300 Dysuria: Secondary | ICD-10-CM | POA: Diagnosis not present

## 2024-05-10 ENCOUNTER — Telehealth (HOSPITAL_BASED_OUTPATIENT_CLINIC_OR_DEPARTMENT_OTHER): Payer: Self-pay

## 2024-05-10 NOTE — Telephone Encounter (Signed)
 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 If we do not receive a reply, we will cancel and discharge this referral.

## 2024-06-29 ENCOUNTER — Other Ambulatory Visit: Payer: Self-pay | Admitting: *Deleted

## 2024-06-29 DIAGNOSIS — J449 Chronic obstructive pulmonary disease, unspecified: Secondary | ICD-10-CM

## 2024-06-29 MED ORDER — MONTELUKAST SODIUM 10 MG PO TABS: 10.0000 mg | ORAL_TABLET | Freq: Every day | ORAL | 6 refills | Status: AC

## 2024-08-08 DIAGNOSIS — H60332 Swimmer's ear, left ear: Secondary | ICD-10-CM | POA: Diagnosis not present

## 2024-08-08 DIAGNOSIS — H6122 Impacted cerumen, left ear: Secondary | ICD-10-CM | POA: Diagnosis not present

## 2024-08-10 ENCOUNTER — Encounter (INDEPENDENT_AMBULATORY_CARE_PROVIDER_SITE_OTHER): Payer: Self-pay | Admitting: *Deleted

## 2024-10-25 NOTE — Progress Notes (Signed)
 [name]                                          MRN: [id number]   10/25/2024   The [facility] Quality Team Specialist reviewed this patient medical record for the purposes of chart review for care gap closure. The following were reviewed: chart review for care gap closure-kidney health evaluation for diabetes:eGFR  and uACR.    [facility] Quality Team

## 2024-11-23 ENCOUNTER — Encounter: Payer: Self-pay | Admitting: Neurology

## 2024-11-23 ENCOUNTER — Other Ambulatory Visit: Payer: Self-pay

## 2024-11-23 DIAGNOSIS — R202 Paresthesia of skin: Secondary | ICD-10-CM

## 2024-12-06 ENCOUNTER — Encounter (INDEPENDENT_AMBULATORY_CARE_PROVIDER_SITE_OTHER): Payer: Self-pay | Admitting: *Deleted

## 2024-12-22 NOTE — Progress Notes (Unsigned)
 [name]                                          MRN: [id number]   12/22/2024   The [facility] Quality Team Specialist reviewed this patient medical record for the purposes of chart review for care gap closure. The following were reviewed: chart review for care gap closure-{CHL AMB [facility] QUALITY SPECIALIST CARE HJED:[id number]}.    [facility] Quality Team

## 2025-01-09 NOTE — Progress Notes (Signed)
 [name]                                          MRN: [id number]   01/09/2025   The [facility] Quality Team Specialist reviewed this patient medical record for the purposes of chart review for care gap closure. The following were reviewed: chart review for care gap closure-glycemic status assessment.    [facility] Quality Team

## 2025-01-11 ENCOUNTER — Encounter: Admitting: Adult Health

## 2025-02-02 ENCOUNTER — Encounter: Admitting: Adult Health

## 2025-02-05 ENCOUNTER — Encounter: Admitting: Neurology
